# Patient Record
Sex: Female | Born: 1990 | Race: White | Hispanic: No | Marital: Single | Smoking: Former smoker
Health system: Southern US, Community
[De-identification: ages and names within clinical notes are randomized; demographics above are authoritative.]

## PROBLEM LIST (undated history)

## (undated) DIAGNOSIS — Z8711 Personal history of peptic ulcer disease: Secondary | ICD-10-CM

## (undated) DIAGNOSIS — M549 Dorsalgia, unspecified: Secondary | ICD-10-CM

## (undated) DIAGNOSIS — O139 Gestational [pregnancy-induced] hypertension without significant proteinuria, unspecified trimester: Secondary | ICD-10-CM

## (undated) DIAGNOSIS — R87619 Unspecified abnormal cytological findings in specimens from cervix uteri: Secondary | ICD-10-CM

## (undated) DIAGNOSIS — O1490 Unspecified pre-eclampsia, unspecified trimester: Secondary | ICD-10-CM

## (undated) DIAGNOSIS — Z8719 Personal history of other diseases of the digestive system: Secondary | ICD-10-CM

## (undated) DIAGNOSIS — O24419 Gestational diabetes mellitus in pregnancy, unspecified control: Secondary | ICD-10-CM

## (undated) HISTORY — PX: TEAR DUCT PROBING: SHX793

## (undated) HISTORY — DX: Unspecified pre-eclampsia, unspecified trimester: O14.90

## (undated) HISTORY — DX: Unspecified abnormal cytological findings in specimens from cervix uteri: R87.619

## (undated) HISTORY — DX: Gestational diabetes mellitus in pregnancy, unspecified control: O24.419

---
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
## 2008-05-07 ENCOUNTER — Encounter: Admission: RE | Admit: 2008-05-07 | Discharge: 2008-05-07 | Payer: Self-pay | Admitting: Family Medicine

## 2010-04-11 ENCOUNTER — Emergency Department (HOSPITAL_COMMUNITY): Admission: EM | Admit: 2010-04-11 | Discharge: 2010-04-11 | Payer: Self-pay | Admitting: Emergency Medicine

## 2010-09-26 ENCOUNTER — Encounter: Payer: Self-pay | Admitting: Family Medicine

## 2010-10-25 ENCOUNTER — Emergency Department (HOSPITAL_COMMUNITY): Payer: No Typology Code available for payment source

## 2010-10-25 ENCOUNTER — Emergency Department (HOSPITAL_COMMUNITY)
Admission: EM | Admit: 2010-10-25 | Discharge: 2010-10-25 | Disposition: A | Discharge status: Home / Self Care | Payer: No Typology Code available for payment source | Attending: Emergency Medicine | Admitting: Emergency Medicine

## 2010-10-25 DIAGNOSIS — M545 Low back pain, unspecified: Secondary | ICD-10-CM | POA: Insufficient documentation

## 2010-10-25 DIAGNOSIS — Y929 Unspecified place or not applicable: Secondary | ICD-10-CM | POA: Insufficient documentation

## 2010-10-25 DIAGNOSIS — S335XXA Sprain of ligaments of lumbar spine, initial encounter: Secondary | ICD-10-CM | POA: Insufficient documentation

## 2010-11-18 LAB — DIFFERENTIAL
Basophils Absolute: 0.1 10*3/uL (ref 0.0–0.1)
Basophils Relative: 1 % (ref 0–1)
Eosinophils Absolute: 0.2 10*3/uL (ref 0.0–0.7)
Eosinophils Relative: 2 % (ref 0–5)
Lymphocytes Relative: 33 % (ref 12–46)
Lymphs Abs: 3.8 10*3/uL (ref 0.7–4.0)
Monocytes Absolute: 0.6 K/uL (ref 0.1–1.0)
Monocytes Relative: 5 % (ref 3–12)
Neutro Abs: 6.6 K/uL (ref 1.7–7.7)
Neutrophils Relative %: 58 % (ref 43–77)

## 2010-11-18 LAB — URINE CULTURE
Colony Count: NO GROWTH
Culture  Setup Time: 201108090156
Culture: NO GROWTH

## 2010-11-18 LAB — URINALYSIS, ROUTINE W REFLEX MICROSCOPIC
Bilirubin Urine: NEGATIVE
Ketones, ur: NEGATIVE mg/dL
Nitrite: NEGATIVE
Protein, ur: NEGATIVE mg/dL
Urobilinogen, UA: 0.2 mg/dL (ref 0.0–1.0)

## 2010-11-18 LAB — BASIC METABOLIC PANEL
BUN: 6 mg/dL (ref 6–23)
CO2: 21 mEq/L (ref 19–32)
Calcium: 9.3 mg/dL (ref 8.4–10.5)
Chloride: 110 mEq/L (ref 96–112)
Creatinine, Ser: 0.7 mg/dL (ref 0.4–1.2)
GFR calc Af Amer: >60 mL/min (ref >60)

## 2010-11-18 LAB — CBC
HCT: 40.9 % (ref 36.0–46.0)
Hemoglobin: 14.1 g/dL (ref 12.0–15.0)
MCH: 31.6 pg (ref 26.0–34.0)
MCHC: 34.6 g/dL (ref 30.0–36.0)
MCV: 91.3 fL (ref 78.0–100.0)
Platelets: 228 10*3/uL (ref 150–400)
RBC: 4.48 MIL/uL (ref 3.87–5.11)
RDW: 12.8 % (ref 11.5–15.5)
WBC: 11.3 10*3/uL — ABNORMAL HIGH (ref 4.0–10.5)

## 2010-11-18 LAB — BASIC METABOLIC PANEL WITH GFR
GFR calc non Af Amer: >60 mL/min (ref >60)
Glucose, Bld: 79 mg/dL (ref 70–99)
Potassium: 3.6 meq/L (ref 3.5–5.1)
Sodium: 138 meq/L (ref 135–145)

## 2010-11-18 LAB — PREGNANCY, URINE: Preg Test, Ur: NEGATIVE

## 2010-11-28 ENCOUNTER — Ambulatory Visit: Payer: No Typology Code available for payment source | Admitting: Physical Therapy

## 2010-12-06 ENCOUNTER — Ambulatory Visit: Payer: No Typology Code available for payment source | Admitting: Physical Therapy

## 2011-05-09 ENCOUNTER — Other Ambulatory Visit: Payer: Self-pay | Admitting: Family Medicine

## 2011-05-09 DIAGNOSIS — M25561 Pain in right knee: Secondary | ICD-10-CM

## 2011-05-11 ENCOUNTER — Ambulatory Visit
Admission: RE | Admit: 2011-05-11 | Discharge: 2011-05-11 | Disposition: A | Discharge status: Home / Self Care | Payer: PRIVATE HEALTH INSURANCE | Source: Ambulatory Visit | Attending: Family Medicine | Admitting: Family Medicine

## 2011-05-11 DIAGNOSIS — M25561 Pain in right knee: Secondary | ICD-10-CM

## 2012-03-25 ENCOUNTER — Emergency Department (HOSPITAL_COMMUNITY)
Admission: EM | Admit: 2012-03-25 | Discharge: 2012-03-25 | Disposition: A | Discharge status: Home / Self Care | Payer: Self-pay | Attending: Emergency Medicine | Admitting: Emergency Medicine

## 2012-03-25 ENCOUNTER — Encounter (HOSPITAL_COMMUNITY): Payer: Self-pay | Admitting: Emergency Medicine

## 2012-03-25 DIAGNOSIS — M545 Low back pain, unspecified: Secondary | ICD-10-CM | POA: Insufficient documentation

## 2012-03-25 DIAGNOSIS — S39012A Strain of muscle, fascia and tendon of lower back, initial encounter: Secondary | ICD-10-CM

## 2012-03-25 DIAGNOSIS — F17200 Nicotine dependence, unspecified, uncomplicated: Secondary | ICD-10-CM | POA: Insufficient documentation

## 2012-03-25 HISTORY — DX: Dorsalgia, unspecified: M54.9

## 2012-03-25 MED ORDER — PREDNISONE 20 MG PO TABS
60.0000 mg | ORAL_TABLET | Freq: Once | ORAL | Status: AC
  Administered 2012-03-25: 60 mg via ORAL
  Filled 2012-03-25: qty 3

## 2012-03-25 MED ORDER — CYCLOBENZAPRINE HCL 10 MG PO TABS: ORAL_TABLET | ORAL | Status: DC

## 2012-03-25 MED ORDER — CYCLOBENZAPRINE HCL 10 MG PO TABS
10.0000 mg | ORAL_TABLET | Freq: Once | ORAL | Status: AC
  Administered 2012-03-25: 10 mg via ORAL
  Filled 2012-03-25: qty 1

## 2012-03-25 MED ORDER — PREDNISONE 50 MG PO TABS: ORAL_TABLET | ORAL | Status: AC

## 2012-03-25 NOTE — ED Notes (Signed)
Pt c/o lower back pain since Saturday. Pt denies any injury.

## 2012-03-25 NOTE — ED Provider Notes (Signed)
History     CSN: [id number]  Arrival date & time 03/25/12  1017   First MD Initiated Contact with Patient 03/25/12 1051      Chief Complaint  Patient presents with  . Back Pain    (Consider location/radiation/quality/duration/timing/severity/associated sxs/prior treatment) HPI Comments: Lower back hurting for 2 days.  No known injury,  Injured back ~ 2 yrs ago but denies chronic problem.  No radiation.  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  Patient is a 21 y.o. female presenting with back pain. The history is provided by the patient. No language interpreter was used.  Back Pain  This is a new problem. The problem occurs constantly. The pain is associated with no known injury. The pain is present in the lumbar spine. The pain is moderate. The symptoms are aggravated by bending and twisting. The pain is the same all the time. Pertinent negatives include no fever, no numbness, no weight loss, no bowel incontinence, no perianal numbness, no bladder incontinence, no dysuria, no leg pain, no paresthesias, no paresis, no tingling and no weakness. She has tried ice and NSAIDs for the symptoms.    Past Medical History  Diagnosis Date  . Back pain     History reviewed. No pertinent past surgical history.  History reviewed. No pertinent family history.  History  Substance Use Topics  . Smoking status: Current Everyday Smoker    Types: Cigarettes  . Smokeless tobacco: Not on file  . Alcohol Use: Yes    OB History    Grav Para Term Preterm Abortions TAB SAB Ect Mult Living                  Review of Systems  Constitutional: Negative for fever and weight loss.  Gastrointestinal: Negative for bowel incontinence.  Genitourinary: Negative for bladder incontinence, dysuria, urgency, frequency and flank pain.  Musculoskeletal: Positive for back pain.  Neurological: Negative for tingling, weakness, numbness and paresthesias.  All other systems reviewed and are  negative.    Allergies  Review of patient's allergies indicates no known allergies.  Home Medications   Current Outpatient Rx  Name Route Sig Dispense Refill  . IBUPROFEN 200 MG PO TABS Oral Take 800 mg by mouth every 6 (six) hours as needed. For pain    . ADULT MULTIVITAMIN W/MINERALS CH Oral Take 1 tablet by mouth daily.    Marland Kitchen PRESCRIPTION MEDICATION Oral Take 1 tablet by mouth daily. Birth Control. Name unknown      Ht 5' (1.524 m)  Wt 200 lb (90.719 kg)  BMI 39.06 kg/m2  LMP 02/03/2012  Physical Exam  Nursing note and vitals reviewed. Constitutional: She is oriented to person, place, and time. She appears well-developed and well-nourished. No distress.  HENT:  Head: Normocephalic and atraumatic.  Eyes: EOM are normal.  Neck: Normal range of motion.  Cardiovascular: Normal rate, regular rhythm and normal heart sounds.   Pulmonary/Chest: Effort normal and breath sounds normal.  Abdominal: Soft. She exhibits no distension. There is no tenderness.  Musculoskeletal: She exhibits tenderness.       Lumbar back: She exhibits tenderness and pain. She exhibits no bony tenderness, no swelling, no edema, no deformity, no laceration, no spasm and normal pulse.       Back:  Neurological: She is alert and oriented to person, place, and time. Coordination normal.  Skin: Skin is warm and dry.  Psychiatric: She has a normal mood and affect. Judgment normal.    ED Course  Procedures (including critical care time)  Labs Reviewed - No data to display No results found.   No diagnosis found.    MDM  No saddle dysthesia or incontinence.   Ice rx-prednisone 50 mg, 6 rx-flexeril 10 mg, 20 F/u with dr. Shelle Iron prn        Evalina Field, PA 03/25/12 [phone]

## 2012-03-25 NOTE — ED Notes (Signed)
Rick PA in prior to RN, see PA assessment for further,

## 2012-03-25 NOTE — ED Provider Notes (Signed)
Medical screening examination/treatment/procedure(s) were performed by non-physician practitioner and as supervising physician I was immediately available for consultation/collaboration.  Donnetta Hutching, MD 03/25/12 1220

## 2012-08-07 ENCOUNTER — Emergency Department (HOSPITAL_COMMUNITY)
Admission: EM | Admit: 2012-08-07 | Discharge: 2012-08-07 | Disposition: A | Discharge status: Home / Self Care | Payer: Self-pay | Attending: Emergency Medicine | Admitting: Emergency Medicine

## 2012-08-07 ENCOUNTER — Encounter (HOSPITAL_COMMUNITY): Payer: Self-pay | Admitting: *Deleted

## 2012-08-07 ENCOUNTER — Emergency Department (HOSPITAL_COMMUNITY): Payer: Self-pay

## 2012-08-07 DIAGNOSIS — R0789 Other chest pain: Secondary | ICD-10-CM | POA: Insufficient documentation

## 2012-08-07 DIAGNOSIS — R6883 Chills (without fever): Secondary | ICD-10-CM | POA: Insufficient documentation

## 2012-08-07 DIAGNOSIS — F17200 Nicotine dependence, unspecified, uncomplicated: Secondary | ICD-10-CM | POA: Insufficient documentation

## 2012-08-07 DIAGNOSIS — IMO0001 Reserved for inherently not codable concepts without codable children: Secondary | ICD-10-CM | POA: Insufficient documentation

## 2012-08-07 DIAGNOSIS — J40 Bronchitis, not specified as acute or chronic: Secondary | ICD-10-CM | POA: Insufficient documentation

## 2012-08-07 DIAGNOSIS — J3489 Other specified disorders of nose and nasal sinuses: Secondary | ICD-10-CM | POA: Insufficient documentation

## 2012-08-07 DIAGNOSIS — M549 Dorsalgia, unspecified: Secondary | ICD-10-CM | POA: Insufficient documentation

## 2012-08-07 MED ORDER — ALBUTEROL SULFATE HFA 108 (90 BASE) MCG/ACT IN AERS
2.0000 | INHALATION_SPRAY | Freq: Once | RESPIRATORY_TRACT | Status: AC
  Administered 2012-08-07: 2 via RESPIRATORY_TRACT
  Filled 2012-08-07: qty 6.7

## 2012-08-07 MED ORDER — GUAIFENESIN-CODEINE 100-10 MG/5ML PO SYRP: 10.0000 mL | ORAL_SOLUTION | Freq: Three times a day (TID) | ORAL | Status: AC | PRN

## 2012-08-07 MED ORDER — AZITHROMYCIN 250 MG PO TABS: ORAL_TABLET | ORAL | Status: DC

## 2012-08-07 NOTE — ED Provider Notes (Signed)
Medical screening examination/treatment/procedure(s) were performed by non-physician practitioner and as supervising physician I was immediately available for consultation/collaboration. Devoria Albe, MD, Armando Gang   Ward Givens, MD 08/07/12 [phone]

## 2012-08-07 NOTE — ED Provider Notes (Signed)
History     CSN: [id number]  Arrival date & time 08/07/12  1135   First MD Initiated Contact with Patient 08/07/12 1232      Chief Complaint  Patient presents with  . Cough    (Consider location/radiation/quality/duration/timing/severity/associated sxs/prior treatment) Patient is a 21 y.o. female presenting with cough. The history is provided by the patient.  Cough This is a new problem. The current episode started more than 1 week ago. The problem occurs every few minutes. The problem has not changed since onset.The cough is productive of purulent sputum. There has been no fever. Associated symptoms include chills, rhinorrhea and myalgias. Pertinent negatives include no chest pain, no sweats, no ear congestion, no ear pain, no headaches, no sore throat, no shortness of breath and no wheezing. Associated symptoms comments: Chest tightness. She has tried decongestants for the symptoms. The treatment provided no relief. She is a smoker. Her past medical history is significant for pneumonia. Her past medical history does not include asthma.    Past Medical History  Diagnosis Date  . Back pain     History reviewed. No pertinent past surgical history.  No family history on file.  History  Substance Use Topics  . Smoking status: Current Every Day Smoker    Types: Cigarettes  . Smokeless tobacco: Not on file  . Alcohol Use: Yes    OB History    Grav Para Term Preterm Abortions TAB SAB Ect Mult Living                  Review of Systems  Constitutional: Positive for chills. Negative for fever, activity change and appetite change.  HENT: Positive for congestion and rhinorrhea. Negative for ear pain, sore throat, facial swelling, trouble swallowing, neck pain and neck stiffness.   Eyes: Negative for visual disturbance.  Respiratory: Positive for cough and chest tightness. Negative for shortness of breath, wheezing and stridor.   Cardiovascular: Negative for chest pain.   Gastrointestinal: Negative for nausea and vomiting.  Musculoskeletal: Positive for myalgias.  Skin: Negative.   Neurological: Negative for dizziness, weakness, numbness and headaches.  Hematological: Negative for adenopathy.  Psychiatric/Behavioral: Negative for confusion.  All other systems reviewed and are negative.    Allergies  Review of patient's allergies indicates no known allergies.  Home Medications   Current Outpatient Rx  Name  Route  Sig  Dispense  Refill  . CYCLOBENZAPRINE HCL 10 MG PO TABS      One tab po TID   20 tablet   0   . IBUPROFEN 200 MG PO TABS   Oral   Take 800 mg by mouth every 6 (six) hours as needed. For pain         . ADULT MULTIVITAMIN W/MINERALS CH   Oral   Take 1 tablet by mouth daily.         Marland Kitchen PRESCRIPTION MEDICATION   Oral   Take 1 tablet by mouth daily. Birth Control. Name unknown           BP 134/70  Pulse 99  Temp 98.6 F (37 C) (Oral)  Resp 20  Ht 5\' 1"  (1.549 m)  Wt 200 lb 8 oz (90.946 kg)  BMI 37.88 kg/m2  SpO2 97%  LMP 08/06/2012  Physical Exam  Nursing note and vitals reviewed. Constitutional: She is oriented to person, place, and time. She appears well-developed and well-nourished. No distress.  HENT:  Head: Normocephalic and atraumatic.  Right Ear: Tympanic membrane and ear canal  normal.  Left Ear: Tympanic membrane and ear canal normal.  Nose: Mucosal edema and rhinorrhea present.  Mouth/Throat: Uvula is midline, oropharynx is clear and moist and mucous membranes are normal. No oropharyngeal exudate.  Eyes: Conjunctivae normal and EOM are normal. Pupils are equal, round, and reactive to light.  Neck: Normal range of motion. Neck supple.  Cardiovascular: Normal rate, regular rhythm, normal heart sounds and intact distal pulses.   No murmur heard. Pulmonary/Chest: Effort normal. No respiratory distress. She has no rales. She exhibits no tenderness.       Coarse lungs sounds bilaterally.  No wheezing or  rales.  Musculoskeletal: She exhibits no edema.  Lymphadenopathy:    She has no cervical adenopathy.  Neurological: She is alert and oriented to person, place, and time. She exhibits normal muscle tone. Coordination normal.  Skin: Skin is warm and dry.    ED Course  Procedures (including critical care time)  Labs Reviewed - No data to display Dg Chest 2 View  08/07/2012  *RADIOLOGY REPORT*  Clinical Data: Cough and shortness of breath.  CHEST - 2 VIEW  Comparison: 03/30/2011.  Findings: The cardiac silhouette, mediastinal and hilar contours are within normal limits and stable. The lungs are clear. Mild chronic bronchitic changes, likely related to smoking.  No pleural effusions.  The bony thorax is intact.  IMPRESSION: Mild chronic bronchitic changes, likely related to smoking.  No acute overlying pulmonary process.   Original Report Authenticated By: Rudie Meyer, M.D.         MDM    Vital signs are stable patient is well-appearing. No hypoxia. Lung sounds are slightly diminished without rales or wheezing. Symptoms are likely related to bronchitis, I am doubtful of PE.   Albuterol inhaler was dispensed in ED.  Patient agrees to close followup with her primary care physician, I will prescribe Zithromax and an guaifenesin with codeine cough syrup.   Matisha Termine L. Marleta Lapierre, [place] 08/07/12 1322

## 2012-08-07 NOTE — ED Notes (Signed)
Reports productive cough with green sputum x 1 wk.  Taking OTC mucinex with no relief.

## 2012-12-12 ENCOUNTER — Emergency Department (HOSPITAL_COMMUNITY)
Admission: EM | Admit: 2012-12-12 | Discharge: 2012-12-12 | Disposition: A | Discharge status: Home / Self Care | Payer: Self-pay | Attending: Emergency Medicine | Admitting: Emergency Medicine

## 2012-12-12 ENCOUNTER — Encounter (HOSPITAL_COMMUNITY): Payer: Self-pay

## 2012-12-12 DIAGNOSIS — R5381 Other malaise: Secondary | ICD-10-CM | POA: Insufficient documentation

## 2012-12-12 DIAGNOSIS — J029 Acute pharyngitis, unspecified: Secondary | ICD-10-CM | POA: Insufficient documentation

## 2012-12-12 DIAGNOSIS — F17200 Nicotine dependence, unspecified, uncomplicated: Secondary | ICD-10-CM | POA: Insufficient documentation

## 2012-12-12 DIAGNOSIS — H9209 Otalgia, unspecified ear: Secondary | ICD-10-CM | POA: Insufficient documentation

## 2012-12-12 DIAGNOSIS — R509 Fever, unspecified: Secondary | ICD-10-CM | POA: Insufficient documentation

## 2012-12-12 MED ORDER — IBUPROFEN 800 MG PO TABS
800.0000 mg | ORAL_TABLET | Freq: Once | ORAL | Status: AC
  Administered 2012-12-12: 800 mg via ORAL
  Filled 2012-12-12: qty 1

## 2012-12-12 MED ORDER — AMOXICILLIN 250 MG PO CAPS
500.0000 mg | ORAL_CAPSULE | Freq: Once | ORAL | Status: AC
  Administered 2012-12-12: 500 mg via ORAL
  Filled 2012-12-12: qty 2

## 2012-12-12 MED ORDER — AMOXICILLIN 500 MG PO CAPS: 500.0000 mg | ORAL_CAPSULE | Freq: Three times a day (TID) | ORAL | Status: DC

## 2012-12-12 MED ORDER — IBUPROFEN 600 MG PO TABS: 600.0000 mg | ORAL_TABLET | Freq: Four times a day (QID) | ORAL | Status: DC | PRN

## 2012-12-12 NOTE — ED Notes (Signed)
Pt c/o sore throat and congestion since last night. Pt states she has "been around someone with strep".

## 2012-12-12 NOTE — ED Notes (Signed)
Sore throat and ear pain per pt. Woke up with my throat hurting around 2 pm. Ears just started hurting per pt.

## 2012-12-15 NOTE — ED Provider Notes (Signed)
History     CSN: [id number]  Arrival date & time 12/12/12  2050   First MD Initiated Contact with Patient 12/12/12 2101      Chief Complaint  Patient presents with  . Sore Throat  . Otalgia    (Consider location/radiation/quality/duration/timing/severity/associated sxs/prior treatment) HPI Comments: Allison Garcia is a 22 y.o. Female who developed sore throat, ear pain and chills with subjective fevers today. She has taken tylenol with reduction in fever.  She has had close contact with a family member who is currently being treated for a documented strep throat.  She denies nasal congestion or drainage,  Cough, shortness of breath, nausea or vomiting, headache, but has felt fatigued.  Her ears hurt when she swallows.     The history is provided by the patient.    Past Medical History  Diagnosis Date  . Back pain     History reviewed. No pertinent past surgical history.  No family history on file.  History  Substance Use Topics  . Smoking status: Current Every Day Smoker    Types: Cigarettes  . Smokeless tobacco: Not on file  . Alcohol Use: Yes    OB History   Grav Para Term Preterm Abortions TAB SAB Ect Mult Living                  Review of Systems  Constitutional: Positive for fever, chills and fatigue.  HENT: Positive for ear pain and sore throat. Negative for congestion, rhinorrhea, neck pain and voice change.   Eyes: Negative.   Respiratory: Negative for cough, chest tightness and shortness of breath.   Cardiovascular: Negative for chest pain.  Gastrointestinal: Negative for nausea and abdominal pain.  Genitourinary: Negative.   Musculoskeletal: Negative for joint swelling and arthralgias.  Skin: Negative.  Negative for rash and wound.  Neurological: Negative for dizziness, weakness, light-headedness, numbness and headaches.  Psychiatric/Behavioral: Negative.     Allergies  Review of patient's allergies indicates no known allergies.  Home  Medications   Current Outpatient Rx  Name  Route  Sig  Dispense  Refill  . acetaminophen (TYLENOL) 500 MG tablet   Oral   Take 1,000 mg by mouth daily as needed for pain.         Marland Kitchen PRESCRIPTION MEDICATION   Oral   Take 1 tablet by mouth every evening. Birth Control. Name unknown         . amoxicillin (AMOXIL) 500 MG capsule   Oral   Take 1 capsule (500 mg total) by mouth 3 (three) times daily.   30 capsule   0   . ibuprofen (ADVIL,MOTRIN) 600 MG tablet   Oral   Take 1 tablet (600 mg total) by mouth every 6 (six) hours as needed for pain.   20 tablet   0     BP 139/95  Pulse 97  Temp(Src) 98.2 F (36.8 C) (Oral)  Resp 16  Ht 5\' 1"  (1.549 m)  Wt 205 lb (92.987 kg)  BMI 38.75 kg/m2  LMP 11/17/2012  Physical Exam  Constitutional: She is oriented to person, place, and time. She appears well-developed and well-nourished.  HENT:  Head: Normocephalic and atraumatic.  Right Ear: Tympanic membrane and ear canal normal.  Left Ear: Tympanic membrane and ear canal normal.  Nose: No mucosal edema or rhinorrhea.  Mouth/Throat: Uvula is midline and mucous membranes are normal. Posterior oropharyngeal edema and posterior oropharyngeal erythema present. No oropharyngeal exudate or tonsillar abscesses.  2+ tonsillar nodes.  Eyes:  Conjunctivae are normal.  Cardiovascular: Normal rate and normal heart sounds.   Pulmonary/Chest: Effort normal. No respiratory distress. She has no wheezes. She has no rales.  Abdominal: Soft. There is no tenderness.  Musculoskeletal: Normal range of motion.  Lymphadenopathy:       Head (right side): Tonsillar adenopathy present. No posterior auricular and no occipital adenopathy present.       Head (left side): Tonsillar adenopathy present. No posterior auricular and no occipital adenopathy present.  Neurological: She is alert and oriented to person, place, and time.  Skin: Skin is warm and dry. No rash noted.  Psychiatric: She has a normal mood and  affect.    ED Course  Procedures (including critical care time)  Labs Reviewed - No data to display No results found.   1. Pharyngitis, acute       MDM  With positive exposure to strep pharyngitis,  Pt treated accordingly with amoxil,  Ibuprofen prescribed for throat pain and fever.  Encouraged rest,  Increased fluid intake,  Recheck for sx not improving or any worsened sx over the next several days.        Burgess Amor, PA-C 12/15/12 [phone]

## 2012-12-16 NOTE — ED Provider Notes (Signed)
Medical screening examination/treatment/procedure(s) were performed by non-physician practitioner and as supervising physician I was immediately available for consultation/collaboration.   Bridget Westbrooks L Keilly Fatula, MD 12/16/12 1121 

## 2013-05-27 ENCOUNTER — Emergency Department (HOSPITAL_COMMUNITY)
Admission: EM | Admit: 2013-05-27 | Discharge: 2013-05-27 | Disposition: A | Discharge status: Home / Self Care | Payer: 59 | Attending: Emergency Medicine | Admitting: Emergency Medicine

## 2013-05-27 ENCOUNTER — Encounter (HOSPITAL_COMMUNITY): Payer: Self-pay | Admitting: Emergency Medicine

## 2013-05-27 DIAGNOSIS — F17200 Nicotine dependence, unspecified, uncomplicated: Secondary | ICD-10-CM | POA: Insufficient documentation

## 2013-05-27 DIAGNOSIS — J40 Bronchitis, not specified as acute or chronic: Secondary | ICD-10-CM

## 2013-05-27 DIAGNOSIS — J209 Acute bronchitis, unspecified: Secondary | ICD-10-CM | POA: Insufficient documentation

## 2013-05-27 DIAGNOSIS — J069 Acute upper respiratory infection, unspecified: Secondary | ICD-10-CM

## 2013-05-27 MED ORDER — GUAIFENESIN-CODEINE 100-10 MG/5ML PO SYRP: 10.0000 mL | ORAL_SOLUTION | Freq: Three times a day (TID) | ORAL | Status: DC | PRN

## 2013-05-27 MED ORDER — AZITHROMYCIN 250 MG PO TABS: ORAL_TABLET | ORAL | Status: DC

## 2013-05-27 NOTE — ED Notes (Signed)
Pt c/o itchy eyes starting last wee. Cough started 3 days ago. Pt states she has thick yellowish green colored sputum. Pt also c/o sore throat, headache, low grade fever and ear "fullness."

## 2013-05-29 NOTE — ED Provider Notes (Signed)
Medical screening examination/treatment/procedure(s) were performed by non-physician practitioner and as supervising physician I was immediately available for consultation/collaboration.  Natayah Warmack, MD 05/29/13 0632 

## 2013-05-29 NOTE — ED Provider Notes (Signed)
CSN: [id number]     Arrival date & time 05/27/13  1527 History   First MD Initiated Contact with Patient 05/27/13 1604     Chief Complaint  Patient presents with  . Cough   (Consider location/radiation/quality/duration/timing/severity/associated sxs/prior Treatment) Patient is a 22 y.o. female presenting with cough. The history is provided by the patient.  Cough Cough characteristics:  Productive Sputum characteristics:  Yellow and green Severity:  Moderate Onset quality:  Gradual Duration:  3 days Timing:  Intermittent Progression:  Worsening Chronicity:  New Smoker: yes   Context: upper respiratory infection   Relieved by:  Nothing Worsened by:  Nothing tried Ineffective treatments:  Cough suppressants and decongestant Associated symptoms: ear pain, fever, headaches, rhinorrhea and sore throat   Associated symptoms: no chills, no rash, no shortness of breath, no sinus congestion and no wheezing   Headaches:    Severity:  Mild   Onset quality:  Gradual   Timing:  Intermittent   Progression:  Waxing and waning   Chronicity:  New Rhinorrhea:    Quality:  Clear   Severity:  Mild   Timing:  Intermittent   Progression:  Unchanged Sore throat:    Severity:  Mild   Onset quality:  Gradual   Timing:  Constant   Progression:  Unchanged   Past Medical History  Diagnosis Date  . Back pain    History reviewed. No pertinent past surgical history. Family History  Problem Relation Age of Onset  . COPD Other    History  Substance Use Topics  . Smoking status: Current Every Day Smoker -- 1.00 packs/day for 9 years    Types: Cigarettes  . Smokeless tobacco: Not on file  . Alcohol Use: Yes     Comment: occas   OB History   Grav Para Term Preterm Abortions TAB SAB Ect Mult Living                 Review of Systems  Constitutional: Positive for fever. Negative for chills, activity change and appetite change.  HENT: Positive for ear pain, congestion, sore throat and  rhinorrhea. Negative for facial swelling, trouble swallowing, neck pain and neck stiffness.   Eyes: Negative for visual disturbance.  Respiratory: Positive for cough. Negative for chest tightness, shortness of breath, wheezing and stridor.   Gastrointestinal: Negative for nausea, vomiting and abdominal pain.  Genitourinary: Negative for dysuria.  Skin: Negative.  Negative for rash.  Neurological: Positive for headaches. Negative for dizziness, weakness, light-headedness and numbness.  Hematological: Negative for adenopathy.  Psychiatric/Behavioral: Negative for confusion.  All other systems reviewed and are negative.    Allergies  Review of patient's allergies indicates no known allergies.  Home Medications   Current Outpatient Rx  Name  Route  Sig  Dispense  Refill  . dextromethorphan-guaiFENesin (MUCINEX DM) 30-600 MG per 12 hr tablet   Oral   Take 1 tablet by mouth daily as needed (for congestion and cold symptoms).         Marland Kitchen ibuprofen (ADVIL,MOTRIN) 200 MG tablet   Oral   Take 800 mg by mouth every 6 (six) hours as needed for pain.         Marland Kitchen levonorgestrel-ethinyl estradiol (SEASONALE,INTROVALE,JOLESSA) 0.15-0.03 MG tablet   Oral   Take 1 tablet by mouth at bedtime.         Marland Kitchen loratadine (ALLERGY RELIEF) 10 MG tablet   Oral   Take 10 mg by mouth daily as needed for allergies.         Marland Kitchen  phenylephrine (SUDAFED PE) 10 MG TABS tablet   Oral   Take 10 mg by mouth every 4 (four) hours as needed.         Marland Kitchen azithromycin (ZITHROMAX Z-PAK) 250 MG tablet      Take two tablets on day one, then one tab qd days 2-5   6 tablet   0   . guaiFENesin-codeine (ROBITUSSIN AC) 100-10 MG/5ML syrup   Oral   Take 10 mLs by mouth 3 (three) times daily as needed for cough.   120 mL   0    BP 140/80  Pulse 93  Temp(Src) 98.3 F (36.8 C) (Oral)  Resp 16  Ht 5\' 1"  (1.549 m)  Wt 200 lb (90.719 kg)  BMI 37.81 kg/m2  SpO2 100%  LMP 05/13/2013 Physical Exam  Nursing note  and vitals reviewed. Constitutional: She is oriented to person, place, and time. She appears well-developed and well-nourished. No distress.  HENT:  Head: Normocephalic and atraumatic.  Right Ear: Tympanic membrane and ear canal normal. Tympanic membrane is not erythematous.  Left Ear: Tympanic membrane and ear canal normal. Tympanic membrane is not erythematous.  Nose: Mucosal edema and rhinorrhea present.  Mouth/Throat: Uvula is midline and mucous membranes are normal. No trismus in the jaw. No edematous. Posterior oropharyngeal erythema present. No oropharyngeal exudate, posterior oropharyngeal edema or tonsillar abscesses.  Eyes: Conjunctivae are normal.  Neck: Normal range of motion and phonation normal. Neck supple. No Brudzinski's sign and no Kernig's sign noted.  Cardiovascular: Normal rate, regular rhythm, normal heart sounds and intact distal pulses.   No murmur heard. Pulmonary/Chest: Effort normal and breath sounds normal. No respiratory distress. She has no wheezes. She has no rales.  Coarse lung sounds bilaterally w/o rales or wheezing  Abdominal: Soft. She exhibits no distension. There is no tenderness. There is no rebound and no guarding.  Musculoskeletal: Normal range of motion. She exhibits no edema.  Lymphadenopathy:    She has no cervical adenopathy.  Neurological: She is alert and oriented to person, place, and time. She exhibits normal muscle tone. Coordination normal.  Skin: Skin is warm and dry.    ED Course  Procedures (including critical care time) Labs Review Labs Reviewed - No data to display Imaging Review No results found.  MDM   1. Bronchitis   2. Upper respiratory infection    VSS.  Patient is well appearing.  No meningeal signs.  No PTA.  Mucous membranes are moist.  Will treat with z pack, robitussin AC and she agrees to rest, fluids and continue sudafed.  Appears stable for discharge, agrees to f/u with her PMD if needed    Dawt Reeb L. Jeremie Giangrande,  PA-C 05/29/13 0104

## 2013-06-14 ENCOUNTER — Emergency Department (HOSPITAL_COMMUNITY)
Admission: EM | Admit: 2013-06-14 | Discharge: 2013-06-14 | Disposition: A | Discharge status: Home / Self Care | Payer: 59 | Attending: Emergency Medicine | Admitting: Emergency Medicine

## 2013-06-14 ENCOUNTER — Encounter (HOSPITAL_COMMUNITY): Payer: Self-pay | Admitting: Emergency Medicine

## 2013-06-14 DIAGNOSIS — Y9389 Activity, other specified: Secondary | ICD-10-CM | POA: Insufficient documentation

## 2013-06-14 DIAGNOSIS — W108XXA Fall (on) (from) other stairs and steps, initial encounter: Secondary | ICD-10-CM | POA: Insufficient documentation

## 2013-06-14 DIAGNOSIS — F17200 Nicotine dependence, unspecified, uncomplicated: Secondary | ICD-10-CM | POA: Insufficient documentation

## 2013-06-14 DIAGNOSIS — IMO0002 Reserved for concepts with insufficient information to code with codable children: Secondary | ICD-10-CM | POA: Insufficient documentation

## 2013-06-14 DIAGNOSIS — M549 Dorsalgia, unspecified: Secondary | ICD-10-CM

## 2013-06-14 DIAGNOSIS — Y9289 Other specified places as the place of occurrence of the external cause: Secondary | ICD-10-CM | POA: Insufficient documentation

## 2013-06-14 MED ORDER — NAPROXEN 500 MG PO TABS: 500.0000 mg | ORAL_TABLET | Freq: Two times a day (BID) | ORAL | Status: DC

## 2013-06-14 MED ORDER — NAPROXEN 250 MG PO TABS
500.0000 mg | ORAL_TABLET | Freq: Once | ORAL | Status: DC
  Filled 2013-06-14: qty 2

## 2013-06-14 MED ORDER — CYCLOBENZAPRINE HCL 10 MG PO TABS
5.0000 mg | ORAL_TABLET | Freq: Once | ORAL | Status: AC
  Administered 2013-06-14: 5 mg via ORAL
  Filled 2013-06-14: qty 1

## 2013-06-14 MED ORDER — HYDROCODONE-ACETAMINOPHEN 5-325 MG PO TABS: ORAL_TABLET | ORAL | Status: DC

## 2013-06-14 MED ORDER — METHOCARBAMOL 500 MG PO TABS: 1000.0000 mg | ORAL_TABLET | Freq: Four times a day (QID) | ORAL | Status: DC

## 2013-06-14 MED ORDER — HYDROCODONE-ACETAMINOPHEN 5-325 MG PO TABS
2.0000 | ORAL_TABLET | Freq: Once | ORAL | Status: AC
  Administered 2013-06-14: 2 via ORAL
  Filled 2013-06-14: qty 2

## 2013-06-14 NOTE — ED Notes (Signed)
Reports falling last night and now having lower back pain that's radiating down right leg, hx of back pain.

## 2013-06-14 NOTE — ED Provider Notes (Signed)
CSN: [id number]     Arrival date & time 06/14/13  1347 History  This chart was scribed for non-physician practitioner, Rhea Bleacher, PA-C working with Flint Melter, MD by Greggory Stallion, ED Scribe. This patient was seen in room TR09C/TR09C and the patient's care was started at 4:01 PM.   Chief Complaint  Patient presents with  . Fall  . Back Pain    HPI HPI Comments: Allison Garcia is a 22 y.o. female with h/o back pain who presents to the Emergency Department complaining of sudden onset, constant sharp lower back pain caused by a fall last night. She states she slipped on the steps and landed on her buttocks. She states the pain is radiating down her right leg intermittently. Pt has taken ibuprofen and tylenol with no relief. Pt denies numbness, tingling, urinary symptoms, bladder or bowel incontinence, fever and abnormal weight loss. She has a history of back pain and was recommended to have surgery but did not want to have it. Pt denies history of cancer or IV drug use.   Past Medical History  Diagnosis Date  . Back pain    History reviewed. No pertinent past surgical history. Family History  Problem Relation Age of Onset  . COPD Other    History  Substance Use Topics  . Smoking status: Current Every Day Smoker -- 1.00 packs/day for 9 years    Types: Cigarettes  . Smokeless tobacco: Not on file  . Alcohol Use: Yes     Comment: occas   OB History   Grav Para Term Preterm Abortions TAB SAB Ect Mult Living                 Review of Systems  Constitutional: Negative for fever and unexpected weight change.  Gastrointestinal: Negative for constipation.       Negative for fecal incontinence.   Genitourinary: Negative for dysuria, hematuria, flank pain, vaginal bleeding, vaginal discharge and pelvic pain.       Negative for bowel or bladder incontinence.   Musculoskeletal: Positive for back pain and myalgias.  Neurological: Negative for weakness and numbness.       Denies  saddle paresthesias.    Allergies  Review of patient's allergies indicates no known allergies.  Home Medications   Current Outpatient Rx  Name  Route  Sig  Dispense  Refill  . ibuprofen (ADVIL,MOTRIN) 200 MG tablet   Oral   Take 1,000 mg by mouth every 6 (six) hours as needed for pain.          Marland Kitchen levonorgestrel-ethinyl estradiol (SEASONALE,INTROVALE,JOLESSA) 0.15-0.03 MG tablet   Oral   Take 1 tablet by mouth at bedtime.         Marland Kitchen OVER THE COUNTER MEDICATION   Oral   Take 2 tablets by mouth every 8 (eight) hours as needed (for pain). Tylenol Back and Body          Triage Vitals:BP 156/99  Pulse 86  Temp(Src) 98.6 F (37 C) (Oral)  Resp 18  SpO2 98%  LMP 05/13/2013 Physical Exam  Nursing note and vitals reviewed. Constitutional: She appears well-developed and well-nourished.  HENT:  Head: Normocephalic and atraumatic.  Eyes: Conjunctivae are normal.  Neck: Normal range of motion. Neck supple.  Pulmonary/Chest: Effort normal.  Abdominal: Soft. There is no tenderness. There is no CVA tenderness.  Musculoskeletal: Normal range of motion.       Cervical back: She exhibits normal range of motion, no tenderness and no bony tenderness.  Thoracic back: She exhibits normal range of motion, no tenderness and no bony tenderness.       Lumbar back: She exhibits tenderness. She exhibits normal range of motion and no bony tenderness.       Back:  No step-off noted with palpation of spine.   Neurological: She is alert. She has normal strength and normal reflexes. No sensory deficit.  5/5 strength in entire lower extremities bilaterally. No sensation deficit.   Skin: Skin is warm and dry. No rash noted.  Psychiatric: She has a normal mood and affect.    ED Course  Procedures (including critical care time) DIAGNOSTIC STUDIES: Oxygen Saturation is 98% on RA, normal by my interpretation.    COORDINATION OF CARE: 4:08 PM- Discussed treatment plan with pt at bedside  which includes pain medication and a muscle relaxer. Pt verbalized understanding and agreement with plan. Advised pt to do gentle stretches.   Labs Review Labs Reviewed - No data to display Imaging Review No results found.  EKG Interpretation   None      Patient seen and examined. Medications ordered.   Vital signs reviewed and are as follows: Filed Vitals:   06/14/13 1627  BP: 117/77  Pulse: 76  Temp: 98 F (36.7 C)  Resp: 18   No red flag s/s of low back pain. Patient was counseled on back pain precautions and told to do activity as tolerated but do not lift, push, or pull heavy objects more than 10 pounds for the next week.  Patient counseled to use ice or heat on back for no longer than 15 minutes every hour.   Patient prescribed muscle relaxer and counseled on proper use of muscle relaxant medication.    Patient prescribed narcotic pain medicine and counseled on proper use of narcotic pain medications. Counseled not to combine this medication with others containing tylenol.   Urged patient not to drink alcohol, drive, or perform any other activities that requires focus while taking either of these medications.  Patient urged to follow-up with PCP if pain does not improve with treatment and rest or if pain becomes recurrent. Urged to return with worsening severe pain, loss of bowel or bladder control, trouble walking.   The patient verbalizes understanding and agrees with the plan.    MDM   1. Back pain    Patient with back pain, some radicular features. No neurological deficits. Patient is ambulatory. No warning symptoms of back pain including: loss of bowel or bladder control, night sweats, waking from sleep with back pain, unexplained fevers or weight loss, h/o cancer, IVDU, recent trauma. No concern for cauda equina, epidural abscess, or other serious cause of back pain. Conservative measures such as rest, ice/heat and pain medicine indicated with PCP follow-up if no  improvement with conservative management.   I personally performed the services described in this documentation, which was scribed in my presence. The recorded information has been reviewed and is accurate.     Renne Crigler, PA-C 06/14/13 1610

## 2014-01-07 ENCOUNTER — Emergency Department (HOSPITAL_COMMUNITY)
Admission: EM | Admit: 2014-01-07 | Discharge: 2014-01-07 | Disposition: A | Discharge status: Home / Self Care | Payer: 59 | Attending: Emergency Medicine | Admitting: Emergency Medicine

## 2014-01-07 ENCOUNTER — Encounter (HOSPITAL_COMMUNITY): Payer: Self-pay | Admitting: Emergency Medicine

## 2014-01-07 DIAGNOSIS — F17200 Nicotine dependence, unspecified, uncomplicated: Secondary | ICD-10-CM | POA: Insufficient documentation

## 2014-01-07 DIAGNOSIS — Z3202 Encounter for pregnancy test, result negative: Secondary | ICD-10-CM | POA: Insufficient documentation

## 2014-01-07 DIAGNOSIS — Z791 Long term (current) use of non-steroidal anti-inflammatories (NSAID): Secondary | ICD-10-CM | POA: Insufficient documentation

## 2014-01-07 DIAGNOSIS — K2970 Gastritis, unspecified, without bleeding: Secondary | ICD-10-CM

## 2014-01-07 DIAGNOSIS — K2990 Gastroduodenitis, unspecified, without bleeding: Principal | ICD-10-CM

## 2014-01-07 HISTORY — DX: Personal history of peptic ulcer disease: Z87.11

## 2014-01-07 HISTORY — DX: Personal history of other diseases of the digestive system: Z87.19

## 2014-01-07 LAB — URINE MICROSCOPIC-ADD ON

## 2014-01-07 LAB — CBC WITH DIFFERENTIAL/PLATELET
Basophils Absolute: 0.1 10*3/uL (ref 0.0–0.1)
Basophils Relative: 1 % (ref 0–1)
EOS ABS: 0.1 10*3/uL (ref 0.0–0.7)
Eosinophils Relative: 1 % (ref 0–5)
HCT: 45.5 % (ref 36.0–46.0)
HEMOGLOBIN: 16.3 g/dL — AB (ref 12.0–15.0)
LYMPHS ABS: 2.3 10*3/uL (ref 0.7–4.0)
Lymphocytes Relative: 17 % (ref 12–46)
MCH: 31.9 pg (ref 26.0–34.0)
MCHC: 35.8 g/dL (ref 30.0–36.0)
MCV: 89 fL (ref 78.0–100.0)
MONOS PCT: 5 % (ref 3–12)
Monocytes Absolute: 0.7 10*3/uL (ref 0.1–1.0)
Neutro Abs: 11 10*3/uL — ABNORMAL HIGH (ref 1.7–7.7)
Neutrophils Relative %: 78 % — ABNORMAL HIGH (ref 43–77)
Platelets: 303 10*3/uL (ref 150–400)
RBC: 5.11 MIL/uL (ref 3.87–5.11)
RDW: 12.7 % (ref 11.5–15.5)
WBC: 14.1 10*3/uL — ABNORMAL HIGH (ref 4.0–10.5)

## 2014-01-07 LAB — URINALYSIS, ROUTINE W REFLEX MICROSCOPIC
BILIRUBIN URINE: NEGATIVE
GLUCOSE, UA: NEGATIVE mg/dL
Ketones, ur: NEGATIVE mg/dL
Leukocytes, UA: NEGATIVE
Nitrite: NEGATIVE
PH: 6 (ref 5.0–8.0)
Protein, ur: NEGATIVE mg/dL
SPECIFIC GRAVITY, URINE: 1.02 (ref 1.005–1.030)
Urobilinogen, UA: 0.2 mg/dL (ref 0.0–1.0)

## 2014-01-07 LAB — LIPASE, BLOOD: Lipase: 20 U/L (ref 11–59)

## 2014-01-07 LAB — COMPREHENSIVE METABOLIC PANEL
ALT: 18 U/L (ref 0–35)
AST: 19 U/L (ref 0–37)
Albumin: 3.6 g/dL (ref 3.5–5.2)
Alkaline Phosphatase: 49 U/L (ref 39–117)
BILIRUBIN TOTAL: 0.2 mg/dL — AB (ref 0.3–1.2)
BUN: 6 mg/dL (ref 6–23)
CALCIUM: 9.8 mg/dL (ref 8.4–10.5)
CO2: 19 meq/L (ref 19–32)
Chloride: 102 mEq/L (ref 96–112)
Creatinine, Ser: 0.66 mg/dL (ref 0.50–1.10)
GLUCOSE: 94 mg/dL (ref 70–99)
POTASSIUM: 4.1 meq/L (ref 3.7–5.3)
Sodium: 137 mEq/L (ref 137–147)
Total Protein: 8 g/dL (ref 6.0–8.3)

## 2014-01-07 LAB — PREGNANCY, URINE: Preg Test, Ur: NEGATIVE

## 2014-01-07 MED ORDER — ONDANSETRON HCL 4 MG/2ML IJ SOLN
4.0000 mg | Freq: Once | INTRAMUSCULAR | Status: AC
  Administered 2014-01-07: 4 mg via INTRAVENOUS
  Filled 2014-01-07: qty 2

## 2014-01-07 MED ORDER — MORPHINE SULFATE 4 MG/ML IJ SOLN
4.0000 mg | Freq: Once | INTRAMUSCULAR | Status: AC
  Administered 2014-01-07: 4 mg via INTRAVENOUS
  Filled 2014-01-07: qty 1

## 2014-01-07 MED ORDER — ONDANSETRON HCL 4 MG PO TABS: 4.0000 mg | ORAL_TABLET | Freq: Four times a day (QID) | ORAL | Status: DC

## 2014-01-07 MED ORDER — HYDROCODONE-ACETAMINOPHEN 5-325 MG PO TABS: 1.0000 | ORAL_TABLET | Freq: Four times a day (QID) | ORAL | Status: DC | PRN

## 2014-01-07 NOTE — Discharge Instructions (Signed)
Follow up with your doctor for continued or worsening symptoms Gastritis, Adult Gastritis is soreness and swelling (inflammation) of the lining of the stomach. Gastritis can develop as a sudden onset (acute) or long-term (chronic) condition. If gastritis is not treated, it can lead to stomach bleeding and ulcers. CAUSES  Gastritis occurs when the stomach lining is weak or damaged. Digestive juices from the stomach then inflame the weakened stomach lining. The stomach lining may be weak or damaged due to viral or bacterial infections. One common bacterial infection is the Helicobacter pylori infection. Gastritis can also result from excessive alcohol consumption, taking certain medicines, or having too much acid in the stomach.  SYMPTOMS  In some cases, there are no symptoms. When symptoms are present, they may include:  Pain or a burning sensation in the upper abdomen.  Nausea.  Vomiting.  An uncomfortable feeling of fullness after eating. DIAGNOSIS  Your caregiver may suspect you have gastritis based on your symptoms and a physical exam. To determine the cause of your gastritis, your caregiver may perform the following:  Blood or stool tests to check for the H pylori bacterium.  Gastroscopy. A thin, flexible tube (endoscope) is passed down the esophagus and into the stomach. The endoscope has a light and camera on the end. Your caregiver uses the endoscope to view the inside of the stomach.  Taking a tissue sample (biopsy) from the stomach to examine under a microscope. TREATMENT  Depending on the cause of your gastritis, medicines may be prescribed. If you have a bacterial infection, such as an H pylori infection, antibiotics may be given. If your gastritis is caused by too much acid in the stomach, H2 blockers or antacids may be given. Your caregiver may recommend that you stop taking aspirin, ibuprofen, or other nonsteroidal anti-inflammatory drugs (NSAIDs). HOME CARE  INSTRUCTIONS  Only take over-the-counter or prescription medicines as directed by your caregiver.  If you were given antibiotic medicines, take them as directed. Finish them even if you start to feel better.  Drink enough fluids to keep your urine clear or pale yellow.  Avoid foods and drinks that make your symptoms worse, such as:  Caffeine or alcoholic drinks.  Chocolate.  Peppermint or mint flavorings.  Garlic and onions.  Spicy foods.  Citrus fruits, such as oranges, lemons, or limes.  Tomato-based foods such as sauce, chili, salsa, and pizza.  Fried and fatty foods.  Eat small, frequent meals instead of large meals. SEEK IMMEDIATE MEDICAL CARE IF:   You have black or dark red stools.  You vomit blood or material that looks like coffee grounds.  You are unable to keep fluids down.  Your abdominal pain gets worse.  You have a fever.  You do not feel better after 1 week.  You have any other questions or concerns. MAKE SURE YOU:  Understand these instructions.  Will watch your condition.  Will get help right away if you are not doing well or get worse. Document Released: 08/15/2001 Document Revised: 02/20/2012 Document Reviewed: 10/04/2011 [facility] Patient Information 2014 [place], [place].

## 2014-01-07 NOTE — ED Provider Notes (Signed)
CSN: [id number]     Arrival date & time 01/07/14  1000 History   First MD Initiated Contact with Patient 01/07/14 1003     Chief Complaint  Patient presents with  . Abdominal Pain     (Consider location/radiation/quality/duration/timing/severity/associated sxs/prior Treatment) HPI Comments: Pt c/o epigastric pain, diarrhea and nausea times 4 days. Pt denies fever or vomiting. Hasn't taken anything over the counter for the diarrhea. Nothing makes the pain better or worse.no bloody stool. No previous abdominal surgeries.  The history is provided by the patient. No language interpreter was used.    Past Medical History  Diagnosis Date  . Back pain   . History of stomach ulcers    History reviewed. No pertinent past surgical history. Family History  Problem Relation Age of Onset  . COPD Other    History  Substance Use Topics  . Smoking status: Current Every Day Smoker -- 1.00 packs/day for 9 years    Types: Cigarettes  . Smokeless tobacco: Not on file  . Alcohol Use: Yes     Comment: occas   OB History   Grav Para Term Preterm Abortions TAB SAB Ect Mult Living                 Review of Systems  Constitutional: Negative.   Respiratory: Negative.   Cardiovascular: Negative.       Allergies  Review of patient's allergies indicates no known allergies.  Home Medications   Prior to Admission medications   Medication Sig Start Date End Date Taking? Authorizing Provider  HYDROcodone-acetaminophen (NORCO/VICODIN) 5-325 MG per tablet Take 1-2 tablets every 6 hours as needed for severe pain 06/14/13   Renne CriglerJoshua Geiple, PA-C  ibuprofen (ADVIL,MOTRIN) 200 MG tablet Take 1,000 mg by mouth every 6 (six) hours as needed for pain.     Historical Provider, MD  levonorgestrel-ethinyl estradiol (SEASONALE,INTROVALE,JOLESSA) 0.15-0.03 MG tablet Take 1 tablet by mouth at bedtime.    Historical Provider, MD  OVER THE COUNTER MEDICATION Take 2 tablets by mouth every 8 (eight) hours as needed  (for pain). Tylenol Back and Body    Historical Provider, MD   BP 131/95  Pulse 97  Temp(Src) 99.3 F (37.4 C) (Oral)  Resp 16  Ht 5\' 1"  (1.549 m)  Wt 200 lb (90.719 kg)  BMI 37.81 kg/m2  SpO2 99%  LMP 11/19/2013 Physical Exam  Nursing note and vitals reviewed. Constitutional: She is oriented to person, place, and time. She appears well-nourished.  Cardiovascular: Normal rate and regular rhythm.   Pulmonary/Chest: Effort normal and breath sounds normal.  Abdominal: Soft. Bowel sounds are normal. There is tenderness in the epigastric area.  Musculoskeletal: Normal range of motion.  Neurological: She is alert and oriented to person, place, and time.  Skin: Skin is warm.  Psychiatric: She has a normal mood and affect.    ED Course  Procedures (including critical care time) Labs Review Labs Reviewed  URINALYSIS, ROUTINE W REFLEX MICROSCOPIC - Abnormal; Notable for the following:    Hgb urine dipstick SMALL (*)    All other components within normal limits  CBC WITH DIFFERENTIAL - Abnormal; Notable for the following:    WBC 14.1 (*)    Hemoglobin 16.3 (*)    Neutrophils Relative % 78 (*)    Neutro Abs 11.0 (*)    All other components within normal limits  COMPREHENSIVE METABOLIC PANEL - Abnormal; Notable for the following:    Total Bilirubin 0.2 (*)    All other components  within normal limits  URINE MICROSCOPIC-ADD ON - Abnormal; Notable for the following:    Squamous Epithelial / LPF MANY (*)    Bacteria, UA MANY (*)    All other components within normal limits  URINE CULTURE  PREGNANCY, URINE  LIPASE, BLOOD    Imaging Review No results found.   EKG Interpretation None      MDM   Final diagnoses:  Gastritis    Pt is feeling better and tolerating po. Will send home with vicodin and zofran for symptomatic treamtent. Pt is okay with plan and given follow up instructions    Teressa LowerVrinda Hoa Briggs, NP 01/07/14 1203

## 2014-01-07 NOTE — ED Notes (Signed)
Pt received discharge instructions and prescriptions, verbalized understanding and has no further questions. Pt ambulated to exit in stable condition accompanied by mother.   

## 2014-01-07 NOTE — ED Notes (Signed)
Epigastric pain with n/d x 4 days.  Denies vomiting.

## 2014-01-08 LAB — URINE CULTURE: Colony Count: >=100000

## 2014-01-08 NOTE — ED Provider Notes (Signed)
Medical screening examination/treatment/procedure(s) were conducted as a shared visit with non-physician practitioner(s) and myself.  I personally evaluated the patient during the encounter.   EKG Interpretation None     No acute abdomen at discharge. Tenderness in the epigastric area.  Donnetta HutchingBrian Miguel Medal, MD 01/08/14 [phone]

## 2018-01-25 DIAGNOSIS — I10 Essential (primary) hypertension: Secondary | ICD-10-CM | POA: Insufficient documentation

## 2018-04-08 DIAGNOSIS — Z8632 Personal history of gestational diabetes: Secondary | ICD-10-CM | POA: Insufficient documentation

## 2018-05-23 DIAGNOSIS — O24419 Gestational diabetes mellitus in pregnancy, unspecified control: Secondary | ICD-10-CM

## 2018-05-23 DIAGNOSIS — O1490 Unspecified pre-eclampsia, unspecified trimester: Secondary | ICD-10-CM

## 2019-08-05 ENCOUNTER — Other Ambulatory Visit: Payer: Self-pay

## 2019-08-05 ENCOUNTER — Ambulatory Visit (INDEPENDENT_AMBULATORY_CARE_PROVIDER_SITE_OTHER): Payer: No Typology Code available for payment source

## 2019-08-05 ENCOUNTER — Ambulatory Visit
Admission: EM | Admit: 2019-08-05 | Discharge: 2019-08-05 | Disposition: A | Discharge status: Home / Self Care | Payer: No Typology Code available for payment source | Attending: Emergency Medicine | Admitting: Emergency Medicine

## 2019-08-05 DIAGNOSIS — T07XXXA Unspecified multiple injuries, initial encounter: Secondary | ICD-10-CM

## 2019-08-05 DIAGNOSIS — M25511 Pain in right shoulder: Secondary | ICD-10-CM

## 2019-08-05 DIAGNOSIS — M7581 Other shoulder lesions, right shoulder: Secondary | ICD-10-CM

## 2019-08-05 DIAGNOSIS — S4991XA Unspecified injury of right shoulder and upper arm, initial encounter: Secondary | ICD-10-CM | POA: Diagnosis not present

## 2019-08-05 DIAGNOSIS — W19XXXA Unspecified fall, initial encounter: Secondary | ICD-10-CM

## 2019-08-05 MED ORDER — CYCLOBENZAPRINE HCL 10 MG PO TABS: 10.0000 mg | ORAL_TABLET | Freq: Every day | ORAL | 0 refills | Status: DC

## 2019-08-05 MED ORDER — MELOXICAM 7.5 MG PO TABS: 7.5000 mg | ORAL_TABLET | Freq: Every day | ORAL | 0 refills | Status: DC

## 2019-08-05 NOTE — Discharge Instructions (Signed)
X-rays negative for fracture or dislocation Continue conservative management of rest, ice, and gentle stretches Take mobic as needed for pain relief (may cause abdominal discomfort, ulcers, and GI bleeds avoid taking with other NSAIDs) Take cyclobenzaprine at nighttime for symptomatic relief. Avoid driving or operating heavy machinery while using medication. Follow up with ortho if symptoms persist Return or go to the ER if you have any new or worsening symptoms (fever, chills, swelling, redness, bruising, numbness/ tingling, etc...)

## 2019-08-05 NOTE — ED Provider Notes (Signed)
[facility]   [id number] 08/05/19 Arrival Time: 1450  CC: Right shoulder pain/ injury  SUBJECTIVE: History from: patient. Allison Garcia is a 28 y.o. female complains of right shoulder pain/ injury that began 1 day ago.  Symptoms began after she tripped and fell over her dogs. Unsure how injury occurred, but complains of multiple scratches and RT shoulder pain.  Localizes the pain to the top of right shoulder.  Describes the pain as constant, achy, but intermittently achy in character.  Has tried OTC medications without relief.  Symptoms are made worse with raising arm overhead.  Denies similar symptoms in the past. Complains of LROM about the RT shoulder.  Denies fever, chills, erythema, ecchymosis, effusion, weakness, numbness and tingling.  ROS: As per HPI.  All other pertinent ROS negative.     Past Medical History:  Diagnosis Date  . Back pain   . History of stomach ulcers    History reviewed. No pertinent surgical history. Allergies  Allergen Reactions  . Other Anaphylaxis and Shortness Of Breath    Bleach Bleach    No current facility-administered medications on file prior to encounter.    Current Outpatient Medications on File Prior to Encounter  Medication Sig Dispense Refill  . ibuprofen (ADVIL,MOTRIN) 200 MG tablet Take 1,000 mg by mouth every 6 (six) hours as needed for pain.     . [DISCONTINUED] levonorgestrel-ethinyl estradiol (SEASONALE,INTROVALE,JOLESSA) 0.15-0.03 MG tablet Take 1 tablet by mouth at bedtime.     Social History   Socioeconomic History  . Marital status: Single    Spouse name: Not on file  . Number of children: Not on file  . Years of education: Not on file  . Highest education level: Not on file  Occupational History  . Not on file  Social Needs  . Financial resource strain: Not on file  . Food insecurity    Worry: Not on file    Inability: Not on file  . Transportation needs    Medical: Not on file    Non-medical: Not on  file  Tobacco Use  . Smoking status: Current Every Day Smoker    Packs/day: 1.00    Years: 9.00    Pack years: 9.00    Types: Cigarettes  . Smokeless tobacco: Never Used  Substance and Sexual Activity  . Alcohol use: Yes    Comment: occas  . Drug use: No  . Sexual activity: Yes    Birth control/protection: Pill  Lifestyle  . Physical activity    Days per week: Not on file    Minutes per session: Not on file  . Stress: Not on file  Relationships  . Social Musician on phone: Not on file    Gets together: Not on file    Attends religious service: Not on file    Active member of club or organization: Not on file    Attends meetings of clubs or organizations: Not on file    Relationship status: Not on file  . Intimate partner violence    Fear of current or ex partner: Not on file    Emotionally abused: Not on file    Physically abused: Not on file    Forced sexual activity: Not on file  Other Topics Concern  . Not on file  Social History Narrative  . Not on file   Family History  Problem Relation Age of Onset  . COPD Other     OBJECTIVE:  Vitals:  08/05/19 1507  BP: 132/87  Pulse: 95  Resp: 18  Temp: 98.4 F (36.9 C)  SpO2: 96%    General appearance: ALERT; in no acute distress.  Head: NCAT Lungs: Normal respiratory effort CV: Radial pulse 2+. Cap refill < 2 seconds Musculoskeletal: RT arm Inspection: Skin warm, dry, clear and intact without obvious erythema, effusion, or ecchymosis.  Palpation: TTP over RT superior trapezius ROM: LROM about the RT shoulder Strength: 4+/5 shld abduction, 5/5 shld adduction, 5/5 elbow flexion, 5/5 elbow extension, 5/5 grip strength Skin: warm and dry; multiple superficial abrasions, scratches to RT ear, and medial RT wirst Neurologic: Ambulates without difficulty; Sensation intact about the upper extremities Psychological: alert and cooperative; normal mood and affect  DIAGNOSTIC STUDIES:  Dg Shoulder Right   Result Date: 08/05/2019 CLINICAL DATA:  Right shoulder pain after injury. Knocked to the floor by dog with right shoulder pain. EXAM: RIGHT SHOULDER - 2+ VIEW COMPARISON:  None. FINDINGS: There is no evidence of fracture or dislocation. There is no evidence of arthropathy or other focal bone abnormality. Soft tissues are unremarkable. IMPRESSION: Negative radiographs of the right shoulder. Electronically Signed   By: Keith Rake M.D.   On: 08/05/2019 15:29    X-rays negative for bony abnormalities including fracture, or dislocation.  No soft tissue swelling.    I have reviewed the x-rays myself and the radiologist interpretation. I am in agreement with the radiologist interpretation.     ASSESSMENT & PLAN:  1. Injury of right shoulder, initial encounter   2. Rotator cuff tendinitis, right   3. Fall, initial encounter     Meds ordered this encounter  Medications  . meloxicam (MOBIC) 7.5 MG tablet    Sig: Take 1 tablet (7.5 mg total) by mouth daily.    Dispense:  30 tablet    Refill:  0    Order Specific Question:   Supervising Provider    Answer:   Raylene Everts [[id number]]  . cyclobenzaprine (FLEXERIL) 10 MG tablet    Sig: Take 1 tablet (10 mg total) by mouth at bedtime.    Dispense:  15 tablet    Refill:  0    Order Specific Question:   Supervising Provider    Answer:   Raylene Everts [[id number]]   X-rays negative for fracture or dislocation Continue conservative management of rest, ice, and gentle stretches Take mobic as needed for pain relief (may cause abdominal discomfort, ulcers, and GI bleeds avoid taking with other NSAIDs) Take cyclobenzaprine at nighttime for symptomatic relief. Avoid driving or operating heavy machinery while using medication. Follow up with ortho if symptoms persist Return or go to the ER if you have any new or worsening symptoms (fever, chills, swelling, redness, bruising, numbness/ tingling, etc...)   Reviewed expectations re: course of  current medical issues. Questions answered. Outlined signs and symptoms indicating need for more acute intervention. Patient verbalized understanding. After Visit Summary given.    Lestine Box, PA-C 08/05/19 1559

## 2019-08-05 NOTE — ED Triage Notes (Signed)
Pt was knocked down by dogs last night, pt having pain in right wrist and right shoulder with limited range of motion in shoulder

## 2019-10-16 ENCOUNTER — Other Ambulatory Visit: Payer: Self-pay

## 2019-10-16 ENCOUNTER — Ambulatory Visit
Admission: EM | Admit: 2019-10-16 | Discharge: 2019-10-16 | Disposition: A | Discharge status: Home / Self Care | Payer: No Typology Code available for payment source | Attending: Emergency Medicine | Admitting: Emergency Medicine

## 2019-10-16 DIAGNOSIS — R0981 Nasal congestion: Secondary | ICD-10-CM | POA: Insufficient documentation

## 2019-10-16 DIAGNOSIS — H9203 Otalgia, bilateral: Secondary | ICD-10-CM | POA: Diagnosis present

## 2019-10-16 DIAGNOSIS — J029 Acute pharyngitis, unspecified: Secondary | ICD-10-CM | POA: Insufficient documentation

## 2019-10-16 DIAGNOSIS — H65193 Other acute nonsuppurative otitis media, bilateral: Secondary | ICD-10-CM | POA: Insufficient documentation

## 2019-10-16 LAB — POCT RAPID STREP A (OFFICE): Rapid Strep A Screen: NEGATIVE

## 2019-10-16 MED ORDER — AMOXICILLIN-POT CLAVULANATE 875-125 MG PO TABS: 1.0000 | ORAL_TABLET | Freq: Two times a day (BID) | ORAL | 0 refills | Status: AC

## 2019-10-16 NOTE — ED Provider Notes (Signed)
[place]   [id number] 10/16/19 Arrival Time: 9326   CC: URI symptoms  SUBJECTIVE: History from: patient.  Allison Garcia is a 29 y.o. female who presents with fatigue, sinus congestion, left ear pain, sore throat, and cough x 3 days ago.  Denies known COVID exposure. Works as a Chartered certified accountant for [employer].  Had negative COVID test at [facility] today.  Has tried OTC medications without relief.  Denies previous COVID infection.   Denies fever, chills, rhinorrhea, SOB, wheezing, chest pain, nausea, vomiting, changes in bowel or bladder habits.    ROS: As per HPI.  All other pertinent ROS negative.     Past Medical History:  Diagnosis Date  . Back pain   . History of stomach ulcers    History reviewed. No pertinent surgical history. Allergies  Allergen Reactions  . Other Anaphylaxis and Shortness Of Breath    Bleach Bleach    No current facility-administered medications on file prior to encounter.   Current Outpatient Medications on File Prior to Encounter  Medication Sig Dispense Refill  . ibuprofen (ADVIL,MOTRIN) 200 MG tablet Take 1,000 mg by mouth every 6 (six) hours as needed for pain.     . [DISCONTINUED] levonorgestrel-ethinyl estradiol (SEASONALE,INTROVALE,JOLESSA) 0.15-0.03 MG tablet Take 1 tablet by mouth at bedtime.     Social History   Socioeconomic History  . Marital status: Single    Spouse name: Not on file  . Number of children: Not on file  . Years of education: Not on file  . Highest education level: Not on file  Occupational History  . Not on file  Tobacco Use  . Smoking status: Current Every Day Smoker    Packs/day: 1.00    Years: 9.00    Pack years: 9.00    Types: Cigarettes  . Smokeless tobacco: Never Used  Substance and Sexual Activity  . Alcohol use: Yes    Comment: occas  . Drug use: No  . Sexual activity: Yes    Birth control/protection: Pill  Other Topics Concern  . Not on file  Social History Narrative  . Not on file    Social Determinants of Health   Financial Resource Strain:   . Difficulty of Paying Living Expenses: Not on file  Food Insecurity:   . Worried About Charity fundraiser in the Last Year: Not on file  . Ran Out of Food in the Last Year: Not on file  Transportation Needs:   . Lack of Transportation (Medical): Not on file  . Lack of Transportation (Non-Medical): Not on file  Physical Activity:   . Days of Exercise per Week: Not on file  . Minutes of Exercise per Session: Not on file  Stress:   . Feeling of Stress : Not on file  Social Connections:   . Frequency of Communication with Friends and Family: Not on file  . Frequency of Social Gatherings with Friends and Family: Not on file  . Attends Religious Services: Not on file  . Active Member of Clubs or Organizations: Not on file  . Attends Archivist Meetings: Not on file  . Marital Status: Not on file  Intimate Partner Violence:   . Fear of Current or Ex-Partner: Not on file  . Emotionally Abused: Not on file  . Physically Abused: Not on file  . Sexually Abused: Not on file   Family History  Problem Relation Age of Onset  . COPD Other     OBJECTIVE:  Vitals:  10/16/19 1256  BP: 118/80  Pulse: 85  Resp: 18  Temp: 98.4 F (36.9 C)  SpO2: 96%     General appearance: alert; appears fatigued, but nontoxic; speaking in full sentences and tolerating own secretions HEENT: NCAT; Ears: EACs clear, RT TM injected, LT TM appears tense, but not erythematous; Eyes: PERRL.  EOM grossly intact. Sinuses: nontender; Nose: nares patent without rhinorrhea, Throat: oropharynx clear, tonsils erythematous and mildly enlarged, uvula midline  Neck: supple without LAD Lungs: unlabored respirations, symmetrical air entry; cough: absent; no respiratory distress; CTAB Heart: regular rate and rhythm.   Skin: warm and dry Psychological: alert and cooperative; normal mood and affect  LABS:  Results for orders placed or performed  during the hospital encounter of 10/16/19 (from the past 24 hour(s))  POCT rapid strep A     Status: None   Collection Time: 10/16/19  1:06 PM  Result Value Ref Range   Rapid Strep A Screen Negative Negative    ASSESSMENT & PLAN:  1. Other non-recurrent acute nonsuppurative otitis media of both ears   2. Ear pain, bilateral   3. Sinus congestion   4. Sore throat     Meds ordered this encounter  Medications  . amoxicillin-clavulanate (AUGMENTIN) 875-125 MG tablet    Sig: Take 1 tablet by mouth every 12 (twelve) hours for 10 days.    Dispense:  20 tablet    Refill:  0    Order Specific Question:   Supervising Provider    Answer:   Eustace Moore [[id number]]    Send-out COVID test negative  Strep negative.  Culture sent we will call you with abnormal results Get plenty of rest and push fluids Augmentin prescribed for ear infection.  Take as directed and completion Use OTC medications like ibuprofen or tylenol as needed fever or pain Call or go to the ED if you have any new or worsening symptoms such as fever, cough, shortness of breath, chest tightness, chest pain, turning blue, changes in mental status, etc...   Reviewed expectations re: course of current medical issues. Questions answered. Outlined signs and symptoms indicating need for more acute intervention. Patient verbalized understanding. After Visit Summary given.         Rennis Harding, PA-C 10/16/19 1331

## 2019-10-16 NOTE — Discharge Instructions (Addendum)
Send-out COVID test negative  Strep negative.  Culture sent we will call you with abnormal results Get plenty of rest and push fluids Augmentin prescribed for ear infection.  Take as directed and completion Use OTC medications like ibuprofen or tylenol as needed fever or pain Call or go to the ED if you have any new or worsening symptoms such as fever, cough, shortness of breath, chest tightness, chest pain, turning blue, changes in mental status, etc..Marland Kitchen

## 2019-10-16 NOTE — ED Triage Notes (Signed)
Pts covid test at [facility] was negative

## 2019-10-16 NOTE — ED Triage Notes (Signed)
Pt has had sinus congestion since Monday  , also has left ear pain and sore throat

## 2019-10-18 LAB — CULTURE, GROUP A STREP (THRC)

## 2019-11-17 ENCOUNTER — Emergency Department (HOSPITAL_COMMUNITY): Payer: No Typology Code available for payment source

## 2019-11-17 ENCOUNTER — Other Ambulatory Visit: Payer: Self-pay

## 2019-11-17 ENCOUNTER — Emergency Department (HOSPITAL_COMMUNITY)
Admission: EM | Admit: 2019-11-17 | Discharge: 2019-11-17 | Disposition: A | Discharge status: Home / Self Care | Payer: No Typology Code available for payment source | Attending: Emergency Medicine | Admitting: Emergency Medicine

## 2019-11-17 ENCOUNTER — Encounter (HOSPITAL_COMMUNITY): Payer: Self-pay | Admitting: Emergency Medicine

## 2019-11-17 DIAGNOSIS — R030 Elevated blood-pressure reading, without diagnosis of hypertension: Secondary | ICD-10-CM | POA: Diagnosis not present

## 2019-11-17 DIAGNOSIS — N939 Abnormal uterine and vaginal bleeding, unspecified: Secondary | ICD-10-CM | POA: Insufficient documentation

## 2019-11-17 DIAGNOSIS — F17210 Nicotine dependence, cigarettes, uncomplicated: Secondary | ICD-10-CM | POA: Diagnosis not present

## 2019-11-17 DIAGNOSIS — R109 Unspecified abdominal pain: Secondary | ICD-10-CM

## 2019-11-17 DIAGNOSIS — R1013 Epigastric pain: Secondary | ICD-10-CM | POA: Insufficient documentation

## 2019-11-17 DIAGNOSIS — Z79899 Other long term (current) drug therapy: Secondary | ICD-10-CM | POA: Insufficient documentation

## 2019-11-17 DIAGNOSIS — R110 Nausea: Secondary | ICD-10-CM | POA: Insufficient documentation

## 2019-11-17 LAB — LIPASE, BLOOD: Lipase: 25 U/L (ref 11–51)

## 2019-11-17 LAB — URINALYSIS, ROUTINE W REFLEX MICROSCOPIC
Bacteria, UA: NONE SEEN
Bilirubin Urine: NEGATIVE
Glucose, UA: NEGATIVE mg/dL
Ketones, ur: NEGATIVE mg/dL
Leukocytes,Ua: NEGATIVE
Nitrite: NEGATIVE
Protein, ur: NEGATIVE mg/dL
Specific Gravity, Urine: 1.008 (ref 1.005–1.030)
pH: 5 (ref 5.0–8.0)

## 2019-11-17 LAB — COMPREHENSIVE METABOLIC PANEL
ALT: 17 U/L (ref 0–44)
AST: 17 U/L (ref 15–41)
Albumin: 3.7 g/dL (ref 3.5–5.0)
Alkaline Phosphatase: 43 U/L (ref 38–126)
Anion gap: 10 (ref 5–15)
BUN: 14 mg/dL (ref 6–20)
CO2: 23 mmol/L (ref 22–32)
Calcium: 9.5 mg/dL (ref 8.9–10.3)
Chloride: 108 mmol/L (ref 98–111)
Creatinine, Ser: 0.63 mg/dL (ref 0.44–1.00)
GFR calc Af Amer: >60 mL/min (ref >60)
GFR calc non Af Amer: >60 mL/min (ref >60)
Glucose, Bld: 93 mg/dL (ref 70–99)
Potassium: 4 mmol/L (ref 3.5–5.1)
Sodium: 141 mmol/L (ref 135–145)
Total Bilirubin: 0.5 mg/dL (ref 0.3–1.2)
Total Protein: 7.4 g/dL (ref 6.5–8.1)

## 2019-11-17 LAB — CBC WITH DIFFERENTIAL/PLATELET
Abs Immature Granulocytes: 0.06 10*3/uL (ref 0.00–0.07)
Basophils Absolute: 0.1 10*3/uL (ref 0.0–0.1)
Basophils Relative: 0 %
Eosinophils Absolute: 0 10*3/uL (ref 0.0–0.5)
Eosinophils Relative: 0 %
HCT: 47.4 % — ABNORMAL HIGH (ref 36.0–46.0)
Hemoglobin: 16 g/dL — ABNORMAL HIGH (ref 12.0–15.0)
Immature Granulocytes: 0 %
Lymphocytes Relative: 19 %
Lymphs Abs: 2.6 10*3/uL (ref 0.7–4.0)
MCH: 31.8 pg (ref 26.0–34.0)
MCHC: 33.8 g/dL (ref 30.0–36.0)
MCV: 94.2 fL (ref 80.0–100.0)
Monocytes Absolute: 0.7 10*3/uL (ref 0.1–1.0)
Monocytes Relative: 5 %
Neutro Abs: 10.5 10*3/uL — ABNORMAL HIGH (ref 1.7–7.7)
Neutrophils Relative %: 76 %
Platelets: 266 10*3/uL (ref 150–400)
RBC: 5.03 MIL/uL (ref 3.87–5.11)
RDW: 12.8 % (ref 11.5–15.5)
WBC: 13.9 10*3/uL — ABNORMAL HIGH (ref 4.0–10.5)
nRBC: 0 % (ref 0.0–0.2)

## 2019-11-17 LAB — POC URINE PREG, ED: Preg Test, Ur: NEGATIVE

## 2019-11-17 MED ORDER — SODIUM CHLORIDE 0.9 % IV BOLUS
1000.0000 mL | Freq: Once | INTRAVENOUS | Status: AC
  Administered 2019-11-17: 1000 mL via INTRAVENOUS

## 2019-11-17 MED ORDER — ALUM & MAG HYDROXIDE-SIMETH 200-200-20 MG/5ML PO SUSP
30.0000 mL | Freq: Once | ORAL | Status: AC
Start: 2019-11-17 — End: 2019-11-17
  Administered 2019-11-17: 13:00:00 30 mL via ORAL
  Filled 2019-11-17: qty 30

## 2019-11-17 MED ORDER — ONDANSETRON HCL 4 MG/2ML IJ SOLN
4.0000 mg | Freq: Once | INTRAMUSCULAR | Status: AC
  Administered 2019-11-17: 12:00:00 4 mg via INTRAVENOUS
  Filled 2019-11-17: qty 2

## 2019-11-17 MED ORDER — PANTOPRAZOLE SODIUM 40 MG PO TBEC: 40.0000 mg | DELAYED_RELEASE_TABLET | Freq: Every day | ORAL | 0 refills | Status: DC

## 2019-11-17 MED ORDER — SUCRALFATE 1 GM/10ML PO SUSP: 1.0000 g | Freq: Three times a day (TID) | ORAL | 0 refills | Status: DC

## 2019-11-17 MED ORDER — LIDOCAINE VISCOUS HCL 2 % MT SOLN
15.0000 mL | Freq: Once | OROMUCOSAL | Status: AC
  Administered 2019-11-17: 15 mL via ORAL
  Filled 2019-11-17: qty 15

## 2019-11-17 MED ORDER — MORPHINE SULFATE (PF) 4 MG/ML IV SOLN
4.0000 mg | Freq: Once | INTRAVENOUS | Status: AC
  Administered 2019-11-17: 4 mg via INTRAVENOUS
  Filled 2019-11-17: qty 1

## 2019-11-17 MED ORDER — ONDANSETRON 4 MG PO TBDP: 4.0000 mg | ORAL_TABLET | Freq: Three times a day (TID) | ORAL | 0 refills | Status: DC | PRN

## 2019-11-17 MED ORDER — FAMOTIDINE IN NACL 20-0.9 MG/50ML-% IV SOLN
20.0000 mg | Freq: Once | INTRAVENOUS | Status: AC
  Administered 2019-11-17: 20 mg via INTRAVENOUS
  Filled 2019-11-17: qty 50

## 2019-11-17 NOTE — ED Provider Notes (Signed)
[facility] EMERGENCY DEPARTMENT Provider Note   CSN: [id number] Arrival date & time: 11/17/19  9485     History Chief Complaint  Patient presents with  . Abdominal Pain    Allison Garcia is a 29 y.o. female with a history of tobacco abuse & gastric ulcers who presents to the ED with complaints of abdominal pain that worsened over the past 24 hours. Patient states that she has been having problems with intermittent abdominal pain for about 6 months, but pain seemed much worse since yesterday. Patient states pain is located in the epigastric region, it is a constant dull ache with intermittent sharp/stabbing pains. She states it is aggravated by eating sometimes, especially with lettuce, also with supine position at times, can be worsened without eating. No alleviating factors. Tried gas-x and omeprazole without much relief. Has had associated nausea without vomiting. Denies fever, chills, emesis, diarrhea, melena, hematochezia, dysuria, urgency, or frequency. Having some mild vaginal spotting, has IUD- irregular periods with this, denies vaginal discharge. She states she was diagnosed with gastric ulcers years ago and this does not feel like that, she does not believe she ever had an EGD done. Reports occasional NSAID & EtOH, none in excess, not daily. Not on blood thinners.   HPI     Past Medical History:  Diagnosis Date  . Back pain   . History of stomach ulcers     There are no problems to display for this patient.   History reviewed. No pertinent surgical history.   OB History    Gravida      Para      Term      Preterm      AB      Living  1     SAB      TAB      Ectopic      Multiple      Live Births              Family History  Problem Relation Age of Onset  . COPD Other     Social History   Tobacco Use  . Smoking status: Current Every Day Smoker    Packs/day: 1.00    Years: 9.00    Pack years: 9.00    Types: Cigarettes  . Smokeless  tobacco: Never Used  Substance Use Topics  . Alcohol use: Yes    Comment: occas  . Drug use: No    Home Medications Prior to Admission medications   Medication Sig Start Date End Date Taking? Authorizing Provider  ibuprofen (ADVIL,MOTRIN) 200 MG tablet Take 1,000 mg by mouth every 6 (six) hours as needed for pain.     [provider]  levonorgestrel-ethinyl estradiol (SEASONALE,INTROVALE,JOLESSA) 0.15-0.03 MG tablet Take 1 tablet by mouth at bedtime.  08/05/19  [provider]    Allergies    Other  Review of Systems   Review of Systems  Constitutional: Negative for chills and fever.  Respiratory: Negative for shortness of breath.   Cardiovascular: Negative for chest pain.  Gastrointestinal: Positive for abdominal pain and nausea. Negative for anal bleeding, blood in stool, constipation, diarrhea and vomiting.  Genitourinary: Positive for vaginal bleeding. Negative for dysuria and vaginal discharge.  Neurological: Negative for syncope.  All other systems reviewed and are negative.   Physical Exam Updated Vital Signs BP (!) 144/91 (BP Location: Right Arm)   Pulse 81   Temp 98.7 F (37.1 C) (Oral)   Resp 18  Ht 5\' 1"  (1.549 m)   Wt 90.7 kg   LMP 11/15/2019   SpO2 95%   BMI 37.79 kg/m   Physical Exam Vitals and nursing note reviewed.  Constitutional:      General: She is not in acute distress.    Appearance: She is well-developed. She is not toxic-appearing.  HENT:     Head: Normocephalic and atraumatic.  Eyes:     General:        Right eye: No discharge.        Left eye: No discharge.     Conjunctiva/sclera: Conjunctivae normal.  Cardiovascular:     Rate and Rhythm: Normal rate and regular rhythm.  Pulmonary:     Effort: Pulmonary effort is normal. No respiratory distress.     Breath sounds: Normal breath sounds. No wheezing, rhonchi or rales.  Abdominal:     General: There is no distension.     Palpations: Abdomen is soft.     Tenderness:  There is abdominal tenderness in the right upper quadrant and epigastric area. There is no guarding or rebound. Negative signs include Murphy's sign and McBurney's sign.  Musculoskeletal:     Cervical back: Neck supple.  Skin:    General: Skin is warm and dry.     Findings: No rash.  Neurological:     Mental Status: She is alert.     Comments: Clear speech.   Psychiatric:        Behavior: Behavior normal.    ED Results / Procedures / Treatments   Labs (all labs ordered are listed, but only abnormal results are displayed) Labs Reviewed  CBC WITH DIFFERENTIAL/PLATELET - Abnormal; Notable for the following components:      Result Value   WBC 13.9 (*)    Hemoglobin 16.0 (*)    HCT 47.4 (*)    Neutro Abs 10.5 (*)    All other components within normal limits  COMPREHENSIVE METABOLIC PANEL  LIPASE, BLOOD  URINALYSIS, ROUTINE W REFLEX MICROSCOPIC  POC URINE PREG, ED    EKG None  Radiology 11/17/2019 Abdomen Limited RUQ  Result Date: 11/17/2019 CLINICAL DATA:  Upper abdominal pain EXAM: ULTRASOUND ABDOMEN LIMITED RIGHT UPPER QUADRANT COMPARISON:  May 07, 2008. FINDINGS: Gallbladder: No gallstones or wall thickening visualized. There is no pericholecystic fluid. No sonographic Murphy sign noted by sonographer. Common bile duct: Diameter: 3 mm. No intrahepatic or extrahepatic biliary duct dilatation. Liver: No focal lesion identified. Within normal limits in parenchymal echogenicity. Portal vein is patent on color Doppler imaging with normal direction of blood flow towards the liver. Other: None. IMPRESSION: Study within normal limits. Electronically Signed   By: May 09[birth year] 2008 III M.D.   On: 11/17/2019 11:34    Procedures Procedures (including critical care time)  Medications Ordered in ED Medications  sodium chloride 0.9 % bolus 1,000 mL (has no administration in time range)  ondansetron (ZOFRAN) injection 4 mg (has no administration in time range)  morphine 4 MG/ML injection 4  mg (has no administration in time range)  famotidine (PEPCID) IVPB 20 mg premix (has no administration in time range)    ED Course  I have reviewed the triage vital signs and the nursing notes.  Pertinent labs & imaging results that were available during my care of the patient were reviewed by me and considered in my medical decision making (see chart for details).    Allison Garcia was evaluated in Emergency Department on 11/17/2019 for the symptoms described in the history  of present illness. He/she was evaluated in the context of the global COVID-19 pandemic, which necessitated consideration that the patient might be at risk for infection with the SARS-CoV-2 virus that causes COVID-19. Institutional protocols and algorithms that pertain to the evaluation of patients at risk for COVID-19 are in a state of rapid change based on information released by regulatory bodies including the CDC and federal and state organizations. These policies and algorithms were followed during the patient's care in the ED.  MDM Rules/Calculators/A&P                      Patient presents to the ED with complaints of abdominal pain. Patient nontoxic appearing, in no apparent distress, vitals WNL other than elevated BP- doubt HTN emergency. On exam patient tender to the epigastrium primarily but also some to the RUQ, no peritoneal signs. Will evaluate with labs and RUQ [place]. Analgesics, anti-emetics, and fluids administered. Chart reviewed for additional history.   ER work-up reviewed & interpreted:  CBC: Mild leukocytosis similar to prior labs on record on chart review. No anemia, hgb/hct somewhat elevated.  CMP: No electrolyte derangement, LFTs WNL. Renal function preserved.  Lipase: WNL UA: hgb present, 0-5 RBCs, patient with some vaginal spotting.  Preg test: Negative.  RUQ [place]: Study within normal limits.   On repeat abdominal exam patient remains without peritoneal signs, doubt cholecystitis, cholangitis,  pancreatitis, diverticulitis, appendicitis, bowel obstruction/perforation, PID or ectopic pregnancy. Hgb/hct elevated, BUN WNL, no reports of blood in stool or hematemesis- do not suspect significant upper GI bleed. Unclear definitive etiology- suspect GERD vs. PUD. Will start protonix, carafate, zofran, and PCP vs. GI follow up. Diet guidelines. Patient tolerating PO in the emergency department, appears appropriate for discharge home. I discussed results, treatment plan, need for PCP follow-up, and return precautions with the patient. Provided opportunity for questions, patient confirmed understanding and is in agreement with plan.   Final Clinical Impression(s) / ED Diagnoses Final diagnoses:  Abdominal pain    Rx / DC Orders ED Discharge Orders         Ordered    pantoprazole (PROTONIX) 40 MG tablet  Daily     11/17/19 1336    sucralfate (CARAFATE) 1 GM/10ML suspension  3 times daily with meals & bedtime     11/17/19 1336    ondansetron (ZOFRAN ODT) 4 MG disintegrating tablet  Every 8 hours PRN     11/17/19 1336           Taralyn Ferraiolo, Glynda Jaeger, PA-C 11/17/19 1339    Davonna Belling, MD 11/17/19 1514

## 2019-11-17 NOTE — Discharge Instructions (Signed)
You were seen in the emergency department today for abdominal pain.  Your work-up was overall reassuring.  Your ultrasound of your gallbladder area was normal.  Your labs did not show any significant problems with your liver function, kidney function, or pancreas.  Your white blood cell count and hemoglobin were mildly elevated, please have these rechecked by your primary care provider.  This may be gastroesophageal reflux disease and/or peptic ulcer disease.  Please follow attached diet guidelines.  Please take Zofran every 8 hours as needed for nausea and vomiting, Protonix daily in the morning prior to meals to help with stomach acidity, and Carafate prior to meals and at bedtime to help coat the stomach as well.  We have prescribed you new medication(s) today. Discuss the medications prescribed today with your pharmacist as they can have adverse effects and interactions with your other medicines including over the counter and prescribed medications. Seek medical evaluation if you start to experience new or abnormal symptoms after taking one of these medicines, seek care immediately if you start to experience difficulty breathing, feeling of your throat closing, facial swelling, or rash as these could be indications of a more serious allergic reaction  Please follow-up with GI within 1 week.  Return to the emergency department for new or worsening symptoms including but not limited to increased pain, inability to keep fluids down, fever, blood in stool, blood in your vomit, or any other concerns.

## 2019-11-17 NOTE — ED Triage Notes (Signed)
PT c/o upper abdominal pain with nausea that started last night. Last BM this am. PT denies any urinary symptoms.

## 2019-11-24 ENCOUNTER — Telehealth: Payer: Self-pay | Admitting: Internal Medicine

## 2019-11-24 NOTE — Telephone Encounter (Signed)
Ok to schedule ov.  

## 2019-11-24 NOTE — Telephone Encounter (Signed)
Er referral  ?

## 2019-11-24 NOTE — Telephone Encounter (Signed)
PATIENT SCHEDULED  °

## 2019-11-27 ENCOUNTER — Encounter: Payer: Self-pay | Admitting: *Deleted

## 2019-11-27 ENCOUNTER — Encounter: Payer: Self-pay | Admitting: Gastroenterology

## 2019-11-27 ENCOUNTER — Ambulatory Visit (INDEPENDENT_AMBULATORY_CARE_PROVIDER_SITE_OTHER): Payer: No Typology Code available for payment source | Admitting: Gastroenterology

## 2019-11-27 ENCOUNTER — Telehealth: Payer: Self-pay | Admitting: *Deleted

## 2019-11-27 ENCOUNTER — Other Ambulatory Visit: Payer: Self-pay

## 2019-11-27 DIAGNOSIS — R1013 Epigastric pain: Secondary | ICD-10-CM | POA: Diagnosis not present

## 2019-11-27 NOTE — Telephone Encounter (Signed)
Clinicals faxed

## 2019-11-27 NOTE — Telephone Encounter (Signed)
Called [facility]. HIDA and EGD requires PA. I need to fax clinicals to [phone].

## 2019-11-27 NOTE — Patient Instructions (Addendum)
Continue Protonix once daily, 30 minutes before breakfast.  We have ordered a HIDA scan to assess gallbladder function.  We have also ordered an upper endoscopy in the near future.  Avoid Ibuprofen for now. I have also included a GERD handout for you.  We will see you back in follow-up after procedure!  It was a pleasure to see you today. I want to create trusting relationships with patients to provide genuine, compassionate, and quality care. I value your feedback. If you receive a survey regarding your visit,  I greatly appreciate you taking time to fill this out.   Allison Needs, PhD, ANP-BC [facility]    Gastroesophageal Reflux Disease, Adult Gastroesophageal reflux (GER) happens when acid from the stomach flows up into the tube that connects the mouth and the stomach (esophagus). Normally, food travels down the esophagus and stays in the stomach to be digested. However, when a person has GER, food and stomach acid sometimes move back up into the esophagus. If this becomes a more serious problem, the person may be diagnosed with a disease called gastroesophageal reflux disease (GERD). GERD occurs when the reflux:  Happens often.  Causes frequent or severe symptoms.  Causes problems such as damage to the esophagus. When stomach acid comes in contact with the esophagus, the acid may cause soreness (inflammation) in the esophagus. Over time, GERD may create small holes (ulcers) in the lining of the esophagus. What are the causes? This condition is caused by a problem with the muscle between the esophagus and the stomach (lower esophageal sphincter, or LES). Normally, the LES muscle closes after food passes through the esophagus to the stomach. When the LES is weakened or abnormal, it does not close properly, and that allows food and stomach acid to go back up into the esophagus. The LES can be weakened by certain dietary substances, medicines, and medical conditions,  including:  Tobacco use.  Pregnancy.  Having a hiatal hernia.  Alcohol use.  Certain foods and beverages, such as coffee, chocolate, onions, and peppermint. What increases the risk? You are more likely to develop this condition if you:  Have an increased body weight.  Have a connective tissue disorder.  Use NSAID medicines. What are the signs or symptoms? Symptoms of this condition include:  Heartburn.  Difficult or painful swallowing.  The feeling of having a lump in the throat.  Abitter taste in the mouth.  Bad breath.  Having a large amount of saliva.  Having an upset or bloated stomach.  Belching.  Chest pain. Different conditions can cause chest pain. Make sure you see your health care provider if you experience chest pain.  Shortness of breath or wheezing.  Ongoing (chronic) cough or a night-time cough.  Wearing away of tooth enamel.  Weight loss. How is this diagnosed? Your health care provider will take a medical history and perform a physical exam. To determine if you have mild or severe GERD, your health care provider may also monitor how you respond to treatment. You may also have tests, including:  A test to examine your stomach and esophagus with a small camera (endoscopy).  A test thatmeasures the acidity level in your esophagus.  A test thatmeasures how much pressure is on your esophagus.  A barium swallow or modified barium swallow test to show the shape, size, and functioning of your esophagus. How is this treated? The goal of treatment is to help relieve your symptoms and to prevent complications. Treatment for this condition  may vary depending on how severe your symptoms are. Your health care provider may recommend:  Changes to your diet.  Medicine.  Surgery. Follow these instructions at home: Eating and drinking   Follow a diet as recommended by your health care provider. This may involve avoiding foods and drinks such  as: ? Coffee and tea (with or without caffeine). ? Drinks that containalcohol. ? Energy drinks and sports drinks. ? Carbonated drinks or sodas. ? Chocolate and cocoa. ? Peppermint and mint flavorings. ? Garlic and onions. ? Horseradish. ? Spicy and acidic foods, including peppers, chili powder, curry powder, vinegar, hot sauces, and barbecue sauce. ? Citrus fruit juices and citrus fruits, such as oranges, lemons, and limes. ? Tomato-based foods, such as red sauce, chili, salsa, and pizza with red sauce. ? Fried and fatty foods, such as donuts, french fries, potato chips, and high-fat dressings. ? High-fat meats, such as hot dogs and fatty cuts of red and white meats, such as rib eye steak, sausage, ham, and bacon. ? High-fat dairy items, such as whole milk, butter, and cream cheese.  Eat small, frequent meals instead of large meals.  Avoid drinking large amounts of liquid with your meals.  Avoid eating meals during the 2-3 hours before bedtime.  Avoid lying down right after you eat.  Do not exercise right after you eat. Lifestyle   Do not use any products that contain nicotine or tobacco, such as cigarettes, e-cigarettes, and chewing tobacco. If you need help quitting, ask your health care provider.  Try to reduce your stress by using methods such as yoga or meditation. If you need help reducing stress, ask your health care provider.  If you are overweight, reduce your weight to an amount that is healthy for you. Ask your health care provider for guidance about a safe weight loss goal. General instructions  Pay attention to any changes in your symptoms.  Take over-the-counter and prescription medicines only as told by your health care provider. Do not take aspirin, ibuprofen, or other NSAIDs unless your health care provider told you to do so.  Wear loose-fitting clothing. Do not wear anything tight around your waist that causes pressure on your abdomen.  Raise (elevate) the  head of your bed about 6 inches (15 cm).  Avoid bending over if this makes your symptoms worse.  Keep all follow-up visits as told by your health care provider. This is important. Contact a health care provider if:  You have: ? New symptoms. ? Unexplained weight loss. ? Difficulty swallowing or it hurts to swallow. ? Wheezing or a persistent cough. ? A hoarse voice.  Your symptoms do not improve with treatment. Get help right away if you:  Have pain in your arms, neck, jaw, teeth, or back.  Feel sweaty, dizzy, or light-headed.  Have chest pain or shortness of breath.  Vomit and your vomit looks like blood or coffee grounds.  Faint.  Have stool that is bloody or black.  Cannot swallow, drink, or eat. Summary  Gastroesophageal reflux happens when acid from the stomach flows up into the esophagus. GERD is a disease in which the reflux happens often, causes frequent or severe symptoms, or causes problems such as damage to the esophagus.  Treatment for this condition may vary depending on how severe your symptoms are. Your health care provider may recommend diet and lifestyle changes, medicine, or surgery.  Contact a health care provider if you have new or worsening symptoms.  Take over-the-counter and prescription  medicines only as told by your health care provider. Do not take aspirin, ibuprofen, or other NSAIDs unless your health care provider told you to do so.  Keep all follow-up visits as told by your health care provider. This is important. This information is not intended to replace advice given to you by your health care provider. Make sure you discuss any questions you have with your health care provider. Document Revised: 02/27/2018 Document Reviewed: 02/27/2018 Elsevier Patient Education  2020 [employer].

## 2019-11-27 NOTE — Progress Notes (Signed)
No pcp per patient 

## 2019-11-27 NOTE — Progress Notes (Signed)
Primary Care Physician:  Patient, No Pcp Per  Referring Physician: [facility] Primary Gastroenterologist:  Dr. Jena Gauss   Chief Complaint  Patient presents with  . Abdominal Pain    mid upper abd; sometimes ruq around to back; went to ED 11/17/19    HPI:   Allison Garcia is a 29 y.o. female presenting today at the request of [facility] due to abdominal pain. RUQ [place] March 2021 normal. No gallstones. WBC count 13.9, Hgb 16. Lipase 25. LFTs normal. 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   Noted epigastric pain that doubled her over in Oct/November while in a patient room. Passed in 15-20 minutes. Would happen intermittently. Sunday night/Monday morning recurrent and worse than labor. Pain was located in epigastric region and radiating RUQ, but also felt like a knife in LUQ stabbing her. Now with dull, constant pain in epigastric region. Was recommended to stop drinking caffeine. Drank a 1/2 can of pepsi on way to [facility] then had the stabbing pain again in epigastric/LUQ. Rare nausea. No vomiting. If eats lettuce, notices it triggers the pain. Greasy food doesn't bother her. States she lives off of Pepsi. No typical reflux symptoms. Used to have chronic symptoms of indigestion but not recently. Would take OTC medications.   Takes ibuprofen every morning when getting home from work. No melena. No weight loss or loss of appetite. No dysphagia. Started Protonix from the ED on 3/15. Varies when she takes it. If not working night shift will take in the mornings. Taking without food.   States she was told by the ED many years ago that she had an ulcer. States this is not the same pain as many years ago.    Past Medical History:  Diagnosis Date  . Abnormal Pap smear of cervix   . Back pain   . History of stomach ulcers    told by ED. no EGD  . Preeclampsia     Past Surgical History:  Procedure Laterality Date  . CESAREAN SECTION    . TEAR DUCT PROBING     as baby    Current  Outpatient Medications  Medication Sig Dispense Refill  . ibuprofen (ADVIL,MOTRIN) 200 MG tablet Take 1,000 mg by mouth every 6 (six) hours as needed for pain.     Marland Kitchen ondansetron (ZOFRAN ODT) 4 MG disintegrating tablet Take 1 tablet (4 mg total) by mouth every 8 (eight) hours as needed for nausea or vomiting. 5 tablet 0  . pantoprazole (PROTONIX) 40 MG tablet Take 1 tablet (40 mg total) by mouth daily. 30 tablet 0  . sucralfate (CARAFATE) 1 GM/10ML suspension Take 10 mLs (1 g total) by mouth 4 (four) times daily -  with meals and at bedtime. 420 mL 0   No current facility-administered medications for this visit.    Allergies as of 11/27/2019 - Review Complete 11/27/2019  Allergen Reaction Noted  . Other Anaphylaxis and Shortness Of Breath 03/11/2019    Family History  Problem Relation Age of Onset  . COPD Other   . Gallbladder disease Mother   . Colon cancer Neg Hx   . Colon polyps Neg Hx     Social History   Socioeconomic History  . Marital status: Single    Spouse name: Not on file  . Number of children: Not on file  . Years of education: Not on file  . Highest education level: Not on file  Occupational History  .  Occupation: Nurse Tech    Comment: [employer]  Tobacco Use  . Smoking status: Current Every Day Smoker    Packs/day: 1.00    Years: 9.00    Pack years: 9.00    Types: Cigarettes  . Smokeless tobacco: Never Used  Substance and Sexual Activity  . Alcohol use: Yes    Comment: occas  . Drug use: No  . Sexual activity: Yes    Birth control/protection: Pill  Other Topics Concern  . Not on file  Social History Narrative  . Not on file   Social Determinants of Health   Financial Resource Strain:   . Difficulty of Paying Living Expenses:   Food Insecurity:   . Worried About Charity fundraiser in the Last Year:   . Arboriculturist in the Last Year:   Transportation Needs:   . Film/video editor (Medical):   Marland Kitchen Lack of Transportation (Non-Medical):    Physical Activity:   . Days of Exercise per Week:   . Minutes of Exercise per Session:   Stress:   . Feeling of Stress :   Social Connections:   . Frequency of Communication with Friends and Family:   . Frequency of Social Gatherings with Friends and Family:   . Attends Religious Services:   . Active Member of Clubs or Organizations:   . Attends Archivist Meetings:   Marland Kitchen Marital Status:   Intimate Partner Violence:   . Fear of Current or Ex-Partner:   . Emotionally Abused:   Marland Kitchen Physically Abused:   . Sexually Abused:     Review of Systems: Gen: Denies any fever, chills, fatigue, weight loss, lack of appetite.  CV: Denies chest pain, heart palpitations, peripheral edema, syncope.  Resp: Denies shortness of breath at rest or with exertion. Denies wheezing or cough.  GI: see HPI GU : Denies urinary burning, urinary frequency, urinary hesitancy MS: Denies joint pain, muscle weakness, cramps, or limitation of movement.  Derm: Denies rash, itching, dry skin Psych: Denies depression, anxiety, memory loss, and confusion Heme: Denies bruising, bleeding, and enlarged lymph nodes.  Physical Exam: BP 129/87   Pulse 74   Temp (!) 97.1 F (36.2 C) (Temporal)   Ht 5\' 1"  (1.549 m)   Wt 208 lb 6.4 oz (94.5 kg)   LMP 11/15/2019 (Approximate)   BMI 39.38 kg/m  General:   Alert and oriented. Pleasant and cooperative. Well-nourished and well-developed.  Head:  Normocephalic and atraumatic. Eyes:  Without icterus, sclera clear and conjunctiva pink.  Ears:  Normal auditory acuity. Mouth:  Mask in place Lungs:  Clear to auscultation bilaterally. No wheezes, rales, or rhonchi. No distress.  Heart:  S1, S2 present without murmurs appreciated.  Abdomen:  +BS, soft, mild epigastric TTP and non-distended. No HSM noted. No guarding or rebound. No masses appreciated.  Rectal:  Deferred  Msk:  Symmetrical without gross deformities. Normal posture. Extremities:  Without  edema. Neurologic:  Alert and  oriented x4;  grossly normal neurologically. Skin:  Intact without significant lesions or rashes. Psych:  Alert and cooperative. Normal mood and affect.  ASSESSMENT: Allison Garcia is a 29 y.o. female presenting today with several month history of intermittent abdominal pain located primarily in epigastric region but radiating to RUQ and LUQ at times, worsening in frequency and severity. Gallbladder present. Labs including CBC, CMP normal, and US abdomen limited without gallstones.   In setting of NSAID use, query possible gastritis/ulcer as culprit; biliary etiology  less likely but unable to be ruled out. She would like to pursue further gallbladder evaluation in interim while waiting on EGD to be completed.   Will pursue EGD with Propofol in near future. HIDA in interim. Agree with PPI daily, avoidance of NSAIDs, and  GERD behavior/diet modifications.    PLAN:  Proceed with upper endoscopy in the near future with Dr. Jena Gauss using Propofol. The risks, benefits, and alternatives have been discussed in detail with patient. They have stated understanding and desire to proceed.   Protonix daily  HIDA scan in future  GERD diet provided.   Avoid NSAIDs.   Follow-up after procedure.   Gelene Mink, PhD, ANP-BC [facility]

## 2019-12-01 NOTE — Telephone Encounter (Signed)
Called [facility] and PA is still pending.

## 2019-12-02 NOTE — Telephone Encounter (Signed)
I called the RN case manager back advising the date for EGD is for 02/05/20. Will await updated auth

## 2019-12-02 NOTE — Telephone Encounter (Signed)
EGD authorization has been updated. Approval # [id number] dates 02/05/2020-03/04/2020

## 2019-12-02 NOTE — Telephone Encounter (Addendum)
PA approved for both procedures. Auth# [id number] dates 02/25/2020-03/24/2020 for EGD, HIDA [id number] dates 12/03/2019-12/31/2019  HIDA scheduled for 3/31 at 8:00am, arrival 7:45am, npo midnight, no pain meds  Called patient to give appt information. She stated she needed to talk to someone about the dye that is used. She had a spinal injection done several years ago and had reaction to dye that was used. I provided patient with nuc med # to call. She will do so. She will let us know if she wants to have the test done or not.  Called [facility] and spoke with nurse Bea Laura and advised EGD is scheduled for 02/05/20. She will get auths fixed.

## 2019-12-03 ENCOUNTER — Encounter (HOSPITAL_COMMUNITY): Payer: No Typology Code available for payment source

## 2019-12-10 ENCOUNTER — Other Ambulatory Visit: Payer: Self-pay

## 2019-12-10 ENCOUNTER — Emergency Department (HOSPITAL_COMMUNITY)
Admission: EM | Admit: 2019-12-10 | Discharge: 2019-12-10 | Disposition: A | Discharge status: Home / Self Care | Payer: PRIVATE HEALTH INSURANCE | Attending: Emergency Medicine | Admitting: Emergency Medicine

## 2019-12-10 ENCOUNTER — Encounter (HOSPITAL_COMMUNITY): Payer: Self-pay | Admitting: Emergency Medicine

## 2019-12-10 DIAGNOSIS — M545 Low back pain, unspecified: Secondary | ICD-10-CM

## 2019-12-10 DIAGNOSIS — M5489 Other dorsalgia: Secondary | ICD-10-CM | POA: Diagnosis present

## 2019-12-10 DIAGNOSIS — F17210 Nicotine dependence, cigarettes, uncomplicated: Secondary | ICD-10-CM | POA: Insufficient documentation

## 2019-12-10 DIAGNOSIS — Z79899 Other long term (current) drug therapy: Secondary | ICD-10-CM | POA: Insufficient documentation

## 2019-12-10 MED ORDER — METHOCARBAMOL 750 MG PO TABS: 750.0000 mg | ORAL_TABLET | Freq: Every evening | ORAL | 0 refills | Status: AC | PRN

## 2019-12-10 MED ORDER — PREDNISONE 10 MG (21) PO TBPK: ORAL_TABLET | Freq: Every day | ORAL | 0 refills | Status: DC

## 2019-12-10 MED ORDER — KETOROLAC TROMETHAMINE 30 MG/ML IJ SOLN
30.0000 mg | Freq: Once | INTRAMUSCULAR | Status: AC
  Administered 2019-12-10: 30 mg via INTRAMUSCULAR
  Filled 2019-12-10: qty 1

## 2019-12-10 NOTE — Discharge Instructions (Signed)
You were given a prescription for Robaxin which is a muscle relaxer.  You should not drive, work, or operate machinery while taking this medication as it can make you very drowsy.    Take prednisone as directed.   Follow up with your primary care doctor or with the back doctor that was provided for you on your discharge paperwork.  Return to the emergency department immediately if you experience any back pain associated with fevers, loss of control of your bowels/bladder, weakness/numbness to your legs, numbness to your groin area, inability to walk, or inability to urinate.

## 2019-12-10 NOTE — ED Provider Notes (Signed)
[facility] EMERGENCY DEPARTMENT Provider Note   CSN: [id number] Arrival date & time: 12/10/19  2020     History Chief Complaint  Patient presents with  . Back Pain    Allison Garcia is a 29 y.o. female.  HPI   Pt is a 29 y/o female with a h/o back pain who presents to the ED today for eval of back pain. 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 While she was positioning the patient, she felt a pull in her back. Pain worsened since onset. Pain worse with certain movements. States that she now feels like she is having some sensation changes to the RLE and she feels like it is going to give out on her. She was able to ambulate to her room from the waiting room.  Denies saddle anesthesia. Denies loss of control of bowels or bladder. No urinary retention. No fevers. Denies a h/o IVDU. Denies a h/o CA or recent unintended weight loss.  She has tried taking tylenol and used topical voltaren gel without significant relief.  Past Medical History:  Diagnosis Date  . Abnormal Pap smear of cervix   . Back pain   . History of stomach ulcers    told by ED. no EGD  . Preeclampsia     Patient Active Problem List   Diagnosis Date Noted  . Dyspepsia 11/27/2019    Past Surgical History:  Procedure Laterality Date  . CESAREAN SECTION    . TEAR DUCT PROBING     as baby     OB History    Gravida      Para      Term      Preterm      AB      Living  1     SAB      TAB      Ectopic      Multiple      Live Births              Family History  Problem Relation Age of Onset  . COPD Other   . Gallbladder disease Mother   . Colon cancer Neg Hx   . Colon polyps Neg Hx     Social History   Tobacco Use  . Smoking status: Current Every Day Smoker    Packs/day: 1.00    Years: 9.00    Pack years: 9.00    Types: Cigarettes  . Smokeless tobacco: Never Used  Substance Use Topics  . Alcohol use: Yes    Comment: occas  .  Drug use: No    Home Medications Prior to Admission medications   Medication Sig Start Date End Date Taking? Authorizing Provider  ibuprofen (ADVIL,MOTRIN) 200 MG tablet Take 1,000 mg by mouth every 6 (six) hours as needed for pain.     [provider]  methocarbamol (ROBAXIN) 750 MG tablet Take 1 tablet (750 mg total) by mouth at bedtime as needed for up to 5 days for muscle spasms. 12/10/19 12/15/19  Dhriti Fales S, PA-C  ondansetron (ZOFRAN ODT) 4 MG disintegrating tablet Take 1 tablet (4 mg total) by mouth every 8 (eight) hours as needed for nausea or vomiting. 11/17/19   Petrucelli, Samantha R, PA-C  pantoprazole (PROTONIX) 40 MG tablet Take 1 tablet (40 mg total) by mouth daily. 11/17/19   Petrucelli, Samantha R, PA-C  predniSONE (STERAPRED UNI-PAK 21 TAB) 10 MG (21) TBPK tablet Take by  mouth daily. Take 6 tabs by mouth daily  for 2 days, then 5 tabs for 2 days, then 4 tabs for 2 days, then 3 tabs for 2 days, 2 tabs for 2 days, then 1 tab by mouth daily for 2 days 12/10/19   Ellie Bryand S, PA-C  sucralfate (CARAFATE) 1 GM/10ML suspension Take 10 mLs (1 g total) by mouth 4 (four) times daily -  with meals and at bedtime. 11/17/19   Petrucelli, Pleas Koch, PA-C  levonorgestrel-ethinyl estradiol (SEASONALE,INTROVALE,JOLESSA) 0.15-0.03 MG tablet Take 1 tablet by mouth at bedtime.  08/05/19  [provider]    Allergies    Other  Review of Systems   Review of Systems  Constitutional: Negative for fever.  Respiratory: Negative for shortness of breath.   Cardiovascular: Negative for chest pain.  Gastrointestinal: Negative for abdominal pain.  Genitourinary: Negative for dysuria and hematuria.  Musculoskeletal: Positive for back pain.  Skin: Negative for color change.  Neurological: Positive for weakness and numbness.  All other systems reviewed and are negative.   Physical Exam Updated Vital Signs BP 124/80 (BP Location: Left Arm)   Pulse 97   Temp 98.5 F (36.9 C)  (Oral)   Resp 16   Ht 5\' 1"  (1.549 m)   Wt 90.7 kg   LMP 11/15/2019 (Approximate)   SpO2 98%   BMI 37.79 kg/m   Physical Exam Vitals and nursing note reviewed.  Constitutional:      General: She is not in acute distress.    Appearance: She is well-developed.  HENT:     Head: Normocephalic and atraumatic.  Eyes:     Conjunctiva/sclera: Conjunctivae normal.  Cardiovascular:     Rate and Rhythm: Normal rate and regular rhythm.     Heart sounds: No murmur.  Pulmonary:     Effort: Pulmonary effort is normal. No respiratory distress.     Breath sounds: Normal breath sounds.  Abdominal:     Palpations: Abdomen is soft.     Tenderness: There is no abdominal tenderness.  Musculoskeletal:     Cervical back: Neck supple.     Comments: TTP to the lower lumbar spine, to the right lumbar paraspinous muscles and to the right upper buttock  Skin:    General: Skin is warm and dry.  Neurological:     Mental Status: She is alert.     Comments: Motor:  Normal tone. 5/5 strength of BLE major muscle groups including strong dorsiflexion/plantar flexion Sensory: decreased sensation to the right lateral thigh down to the knee on the RLE, sensation to the RLE is otherwise wnl. LLE sensation is normal Gait: normal gait and balance.       ED Results / Procedures / Treatments   Labs (all labs ordered are listed, but only abnormal results are displayed) Labs Reviewed - No data to display  EKG None  Radiology No results found.  Procedures Procedures (including critical care time)  Medications Ordered in ED Medications  ketorolac (TORADOL) 30 MG/ML injection 30 mg (has no administration in time range)    ED Course  I have reviewed the triage vital signs and the nursing notes.  Pertinent labs & imaging results that were available during my care of the patient were reviewed by me and considered in my medical decision making (see chart for details).    MDM Rules/Calculators/A&P                       Patient with back  pain.  No neurological deficits and normal neuro exam.  Patient can walk but states is painful.  No loss of bowel or bladder control.  No concern for cauda equina.  No fever, night sweats, weight loss, h/o cancer, IVDU. Supportive therapy indicated and discussed with patient.    Final Clinical Impression(s) / ED Diagnoses Final diagnoses:  Acute low back pain, unspecified back pain laterality, unspecified whether sciatica present    Rx / DC Orders ED Discharge Orders         Ordered    predniSONE (STERAPRED UNI-PAK 21 TAB) 10 MG (21) TBPK tablet  Daily     12/10/19 2158    methocarbamol (ROBAXIN) 750 MG tablet  At bedtime PRN     12/10/19 2158           Rodney Booze, PA-C 12/10/19 2203    Lajean Saver, MD 12/11/19 1623

## 2019-12-10 NOTE — ED Triage Notes (Signed)
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

## 2019-12-17 ENCOUNTER — Ambulatory Visit: Payer: No Typology Code available for payment source | Admitting: Family Medicine

## 2019-12-31 ENCOUNTER — Ambulatory Visit: Payer: Self-pay

## 2019-12-31 ENCOUNTER — Ambulatory Visit (HOSPITAL_COMMUNITY): Payer: PRIVATE HEALTH INSURANCE | Attending: Occupational Medicine | Admitting: Physical Therapy

## 2019-12-31 ENCOUNTER — Other Ambulatory Visit: Payer: Self-pay | Admitting: Occupational Medicine

## 2019-12-31 ENCOUNTER — Other Ambulatory Visit: Payer: Self-pay

## 2019-12-31 ENCOUNTER — Encounter (HOSPITAL_COMMUNITY): Payer: Self-pay | Admitting: Physical Therapy

## 2019-12-31 DIAGNOSIS — R2689 Other abnormalities of gait and mobility: Secondary | ICD-10-CM | POA: Diagnosis present

## 2019-12-31 DIAGNOSIS — M545 Low back pain, unspecified: Secondary | ICD-10-CM

## 2019-12-31 DIAGNOSIS — M6281 Muscle weakness (generalized): Secondary | ICD-10-CM | POA: Insufficient documentation

## 2019-12-31 DIAGNOSIS — R29898 Other symptoms and signs involving the musculoskeletal system: Secondary | ICD-10-CM | POA: Diagnosis present

## 2019-12-31 NOTE — Therapy (Signed)
[place], [place], [address] Phone: [phone]   Fax:  [phone]  Physical Therapy Evaluation  Patient Details  Name: Allison Garcia MRN: [id number] Date of Birth: 10-Apr-1991 Referring Provider (PT): Dannial Monarch MD   Encounter Date: 12/31/2019  PT End of Session - 12/31/19 1607    Visit Number  1    Number of Visits  8    Date for PT Re-Evaluation  01/28/20    Authorization Type  Workers Comp    Authorization - Visit Number  1    Authorization - Number of Visits  8    PT Start Time  1300    PT Stop Time  1345    PT Time Calculation (min)  45 min    Activity Tolerance  Patient tolerated treatment well    Behavior During Therapy  [facility] for tasks assessed/performed       Past Medical History:  Diagnosis Date  . Abnormal Pap smear of cervix   . Back pain   . History of stomach ulcers    told by ED. no EGD  . Preeclampsia     Past Surgical History:  Procedure Laterality Date  . CESAREAN SECTION    . TEAR DUCT PROBING     as baby    There were no vitals filed for this visit.   Subjective Assessment - 12/31/19 1301    Subjective  Patient is a 29 y.o. female who presents to physical therapy with c/o low back pain due to injury suffered at work on 12/10/19. Patient states she was helping to move a heavy patient while at work and she injured her back. She didn't notice pain much right after but once she woke up she noticed a pain, trembling, and RLE weakness. She has symptoms in her R glute down to knee She thinks she was pushing the patient when she injured herself. Sitting, standing, and changing positions don't help symptoms. Sitting in her tub with warm water and heated seat seem to help. Reaching forward, laying flat, sitting, and pushing increase symptoms. She tried to go back to work recently but was in too much pain. Her main goal is to make her back better. Patient denies changes in bowel and bladder and  numbness tingling in the groin region.    Pertinent History  hx back pain, physical job    Limitations  Lifting;Sitting;Standing;House hold activities    Patient Stated Goals  make her back better    Currently in Pain?  Yes    Pain Score  6    at worst 9/10   Pain Location  Back    Pain Descriptors / Indicators  Aching;Constant    Pain Type  Acute pain    Pain Onset  1 to 4 weeks ago         [facility] PT Assessment - 12/31/19 0001      Assessment   Medical Diagnosis  Sacrococcygeal Disorder/ LBP    Referring Provider (PT)  Dannial Monarch MD    Onset Date/Surgical Date  12/10/19    Next MD Visit  none    Prior Therapy  Yes      Precautions   Precautions  None      Restrictions   Weight Bearing Restrictions  No      Balance Screen   Has the patient fallen in the past 6 months  No    Has the patient had a decrease in  activity level because of a fear of falling?   No    Is the patient reluctant to leave their home because of a fear of falling?   No      Prior Function   Level of Independence  Independent    Vocation  Work[employer]    Leisure  hang out with kids, play      Cognition   Overall Cognitive Status  Within Functional Limits for tasks assessed      Observation/Other Assessments   Observations  Ambualtes without AD, slow, labored gait, guarded movements    Focus on Therapeutic Outcomes (FOTO)   62% limited      Sensation   Light Touch  Appears Intact      Posture/Postural Control   Posture/Postural Control  Postural limitations    Postural Limitations  Increased thoracic kyphosis;Increased lumbar lordosis      ROM / Strength   AROM / PROM / Strength  AROM;Strength      AROM   Overall AROM Comments  most pain with flexion    AROM Assessment Site  Lumbar    Lumbar Flexion  50% limited, pain in back and hip    Lumbar Extension  25% limited    Lumbar - Right Side Bend  0% limited    Lumbar - Left Side Bend  0% limited    Lumbar -  Right Rotation  0% limited    Lumbar - Left Rotation  0% limited      Strength   Overall Strength Comments  weakness on R    Strength Assessment Site  Hip;Knee;Ankle    Right/Left Hip  Right;Left    Right Hip Flexion  4-/5    Right Hip Extension  4-/5    Right Hip ABduction  4/5    Left Hip Flexion  5/5    Left Hip Extension  4+/5    Left Hip ABduction  5/5    Right/Left Knee  Right;Left    Right Knee Flexion  5/5    Right Knee Extension  4-/5    Left Knee Flexion  5/5    Left Knee Extension  5/5    Right/Left Ankle  Right;Left    Right Ankle Dorsiflexion  5/5    Left Ankle Dorsiflexion  5/5      Palpation   Spinal mobility  hypomobile thoracic, normal to hypermobile lumbar regin, CPAL2-L4 painful in R buttocks and back, L and R UPA L2-L3 pain in R buttock - concordant    SI assessment   AP R SIJ painful    Palpation comment  TTP lumbar paraspinals with greatest on R lower                Objective measurements completed on examination: See above findings.              PT Education - 12/31/19 1313    Education Details  Patient educated on exam findings, POC, scope of PT, LBP, remaining active and performing physical activity    Person(s) Educated  Patient    Methods  Explanation    Comprehension  Verbalized understanding       PT Short Term Goals - 12/31/19 1628      PT SHORT TERM GOAL #1   Title  Patient will be independent with HEP in order to improve functional outcomes.    Time  2    Period  Weeks    Status  New    Target Date  01/14/20      PT SHORT TERM GOAL #2   Title  Patient will report at least 25% improvement in symptoms for improved quality of life.    Time  2    Period  Weeks    Status  New    Target Date  01/14/20        PT Long Term Goals - 12/31/19 1628      PT LONG TERM GOAL #1   Title  Patient will report at least 75% improvement in symptoms for improved quality of life.    Time  4    Period  Weeks    Status  New     Target Date  01/28/20      PT LONG TERM GOAL #2   Title  Patient will improve FOTO score by at least 5 points in order to indicate improved tolerance to activity.    Time  4    Period  Weeks    Status  New    Target Date  01/28/20      PT LONG TERM GOAL #3   Title  Patient will report centralized symptoms with pain no greater than 1/10 with lumbar AROM for improved function with ADL.    Time  4    Period  Weeks    Status  New    Target Date  01/28/20      PT LONG TERM GOAL #4   Title  Patient will improve lumbar flexion and extension ROM by at least 25% in order to improve ability to return to work.    Time  4    Period  Weeks    Status  New    Target Date  01/28/20             Plan - 12/31/19 1617    Clinical Impression Statement  Patient is a 29 y.o. female who presents to physical therapy with c/o low back pain due to injury suffered at work on 12/10/19.  She presents with pain limited deficits in Lumbar and RLE strength, ROM, endurance, postural impairments, spinal mobility and functional mobility with ADL. She is having to modify and restrict ADL as indicated by FOTO score as well as subjective information and objective measures which is affecting overall participation. Patient will benefit from skilled physical therapy in order to improve function and reduce impairment.    Personal Factors and Comorbidities  Past/Current Experience;Comorbidity 1;Time since onset of injury/illness/exacerbation;Profession    Comorbidities  history back pain    Examination-Activity Limitations  Bed Mobility;Bend;Caring for Others;Carry;Lift;Locomotion Level;Squat;Transfers    Examination-Participation Restrictions  Cleaning;Driving;Laundry;Yard Work;Volunteer;Shop;Meal Prep    Stability/Clinical Decision Making  Evolving/Moderate complexity    Clinical Decision Making  Moderate    Rehab Potential  Good    PT Frequency  2x / week    PT Duration  4 weeks    PT Treatment/Interventions   ADLs/Self Care Home Management;Aquatic Therapy;Biofeedback;Cryotherapy;Electrical Stimulation;Iontophoresis 4mg /ml Dexamethasone;Moist Heat;Traction;Ultrasound;Parrafin;Fluidtherapy;DME Instruction;Gait training;Stair training;Functional mobility training;Therapeutic activities;Therapeutic exercise;Balance training;Neuromuscular re-education;Patient/family education;Orthotic Fit/Training;Manual techniques;Manual lymph drainage;Compression bandaging;Scar mobilization;Passive range of motion;Dry needling;Energy conservation;Taping;Splinting;Spinal Manipulations;Joint Manipulations    PT Next Visit Plan  initiate HEP, possibly begin manual therapy for pain relief, trial repeated movements, begin core and postural strengthing    PT Home Exercise Plan  initiate next session    Consulted and Agree with Plan of Care  Patient       Patient will benefit from skilled therapeutic intervention in  order to improve the following deficits and impairments:  Abnormal gait, Decreased activity tolerance, Decreased balance, Decreased mobility, Decreased range of motion, Decreased strength, Hypermobility, Hypomobility, Difficulty walking, Increased muscle spasms, Postural dysfunction, Improper body mechanics, Pain  Visit Diagnosis: Low back pain, unspecified back pain laterality, unspecified chronicity, unspecified whether sciatica present  Muscle weakness (generalized)  Other abnormalities of gait and mobility  Other symptoms and signs involving the musculoskeletal system     Problem List Patient Active Problem List   Diagnosis Date Noted  . Dyspepsia 11/27/2019    4:35 PM, 12/31/19 Wyman Songster PT, DPT Physical Therapist at [facility]   [facility] [facility] [address] [place], [place], [address] Phone: [phone]   Fax:  [phone]  Name: Allison Garcia MRN: [id number] Date of Birth: [birth year]

## 2020-01-06 ENCOUNTER — Encounter (HOSPITAL_COMMUNITY): Payer: Self-pay | Admitting: Physical Therapy

## 2020-01-06 ENCOUNTER — Other Ambulatory Visit: Payer: Self-pay

## 2020-01-06 ENCOUNTER — Ambulatory Visit (HOSPITAL_COMMUNITY): Payer: PRIVATE HEALTH INSURANCE | Attending: Occupational Medicine | Admitting: Physical Therapy

## 2020-01-06 DIAGNOSIS — M545 Low back pain, unspecified: Secondary | ICD-10-CM

## 2020-01-06 DIAGNOSIS — M6281 Muscle weakness (generalized): Secondary | ICD-10-CM | POA: Insufficient documentation

## 2020-01-06 DIAGNOSIS — R29898 Other symptoms and signs involving the musculoskeletal system: Secondary | ICD-10-CM | POA: Insufficient documentation

## 2020-01-06 DIAGNOSIS — R2689 Other abnormalities of gait and mobility: Secondary | ICD-10-CM | POA: Diagnosis present

## 2020-01-06 NOTE — Therapy (Signed)
[facility] [address] [place], [place], [address] Phone: [phone]   Fax:  [phone]  Physical Therapy Treatment  Patient Details  Name: Allison Garcia MRN: [id number] Date of Birth: 06/22/1991 Referring Provider (PT): Lucia Gaskins MD   Encounter Date: 01/06/2020  PT End of Session - 01/06/20 0810    Visit Number  2    Number of Visits  8    Date for PT Re-Evaluation  01/28/20    Authorization Type  Workers Comp    Authorization - Visit Number  2    Authorization - Number of Visits  8    PT Start Time  0807    PT Stop Time  0849    PT Time Calculation (min)  42 min    Activity Tolerance  Patient tolerated treatment well    Behavior During Therapy  [facility] for tasks assessed/performed       Past Medical History:  Diagnosis Date  . Abnormal Pap smear of cervix   . Back pain   . History of stomach ulcers    told by ED. no EGD  . Preeclampsia     Past Surgical History:  Procedure Laterality Date  . CESAREAN SECTION    . TEAR DUCT PROBING     as baby    There were no vitals filed for this visit.  Subjective Assessment - 01/06/20 0804    Subjective  Patient reports she has been taking her meds for her back and it hasn't been hurting as bad. She tried to stay moving as much as possible. It is not as bad as it was but that is how it was last time she was taking it as well.    Pertinent History  hx back pain, physical job    Limitations  Lifting;Sitting;Standing;House hold activities    Patient Stated Goals  make her back better    Currently in Pain?  Yes    Pain Score  4     Pain Location  Back    Pain Onset  1 to 4 weeks ago                       [facility] Adult PT Treatment/Exercise - 01/06/20 0001      Exercises   Exercises  Lumbar      Lumbar Exercises: Standing   Other Standing Lumbar Exercises  standing lumbar extensions 2x10      Lumbar Exercises: Prone   Other Prone Lumbar Exercises  prone press  ups 2x10      Manual Therapy   Manual Therapy  Soft tissue mobilization;Joint mobilization    Manual therapy comments  completed independly from all other aspects of treatment    Joint Mobilization  R UPA L2-L4 gradeII-III    Soft tissue mobilization  lumbar paraspinals             PT Education - 01/06/20 0809    Education Details  Patient educated on HEP, repeated extension and mobilization effects as well as low back pathology and monitoring symptoms.    Person(s) Educated  Patient    Methods  Explanation;Demonstration;Handout    Comprehension  Verbalized understanding;Returned demonstration       PT Short Term Goals - 01/06/20 0853      PT SHORT TERM GOAL #1   Title  Patient will be independent with HEP in order to improve functional outcomes.    Time  2  Period  Weeks    Status  On-going    Target Date  01/14/20      PT SHORT TERM GOAL #2   Title  Patient will report at least 25% improvement in symptoms for improved quality of life.    Time  2    Period  Weeks    Status  On-going    Target Date  01/14/20        PT Long Term Goals - 01/06/20 0854      PT LONG TERM GOAL #1   Title  Patient will report at least 75% improvement in symptoms for improved quality of life.    Time  4    Period  Weeks    Status  On-going      PT LONG TERM GOAL #2   Title  Patient will improve FOTO score by at least 5 points in order to indicate improved tolerance to activity.    Time  4    Period  Weeks    Status  On-going      PT LONG TERM GOAL #3   Title  Patient will report centralized symptoms with pain no greater than 1/10 with lumbar AROM for improved function with ADL.    Time  4    Period  Weeks    Status  On-going      PT LONG TERM GOAL #4   Title  Patient will improve lumbar flexion and extension ROM by at least 25% in order to improve ability to return to work.    Time  4    Period  Weeks    Status  On-going            Plan - 01/06/20 8338     Clinical Impression Statement  Patient does not experience a change in symptoms following repeated extensions in standing. Patient experiences reduction in low back symptoms following soft tissue mobilization to R lumbar paraspinals. Patient experiences reproduction of R lumbar and hip symptoms with mobilizations on R which causes a decrease in intensity of symptoms following mobilizations. Intensity returns to about the same after increased reps of mobilization. Patient states back/hip symptoms improve during prone press ups and she does not experience any symptoms with extension. Patient educated on repeated extension and mobilization effects as well as low back pathology and monitoring symptoms. Patient will continue to benefit from skilled physical therapy in order to improve symptoms and reduce impairment.    Personal Factors and Comorbidities  Past/Current Experience;Comorbidity 1;Time since onset of injury/illness/exacerbation;Profession    Comorbidities  history back pain    Examination-Activity Limitations  Bed Mobility;Bend;Caring for Others;Carry;Lift;Locomotion Level;Squat;Transfers    Examination-Participation Restrictions  Cleaning;Driving;Laundry;Yard Work;Volunteer;Shop;Meal Prep    Rehab Potential  Good    PT Frequency  2x / week    PT Duration  4 weeks    PT Treatment/Interventions  ADLs/Self Care Home Management;Aquatic Therapy;Biofeedback;Cryotherapy;Electrical Stimulation;Iontophoresis 4mg /ml Dexamethasone;Moist Heat;Traction;Ultrasound;Parrafin;Fluidtherapy;DME Instruction;Gait training;Stair training;Functional mobility training;Therapeutic activities;Therapeutic exercise;Balance training;Neuromuscular re-education;Patient/family education;Orthotic Fit/Training;Manual techniques;Manual lymph drainage;Compression bandaging;Scar mobilization;Passive range of motion;Dry needling;Energy conservation;Taping;Splinting;Spinal Manipulations;Joint Manipulations    PT Next Visit Plan  continue  manual therapy for pain relief, assess response to repeated movements, begin core and postural strengthing    PT Home Exercise Plan  5/4 repeated extension, press ups    Consulted and Agree with Plan of Care  Patient       Patient will benefit from skilled therapeutic intervention in order to improve the following deficits and impairments:  Abnormal gait,  Decreased activity tolerance, Decreased balance, Decreased mobility, Decreased range of motion, Decreased strength, Hypermobility, Hypomobility, Difficulty walking, Increased muscle spasms, Postural dysfunction, Improper body mechanics, Pain  Visit Diagnosis: Low back pain, unspecified back pain laterality, unspecified chronicity, unspecified whether sciatica present  Muscle weakness (generalized)  Other abnormalities of gait and mobility  Other symptoms and signs involving the musculoskeletal system     Problem List Patient Active Problem List   Diagnosis Date Noted  . Dyspepsia 11/27/2019    8:55 AM, 01/06/20 Wyman Songster PT, DPT Physical Therapist at [facility]  [facility] [address] [place], [place], [address] Phone: [phone]   Fax:  [phone]  Name: Allison Garcia MRN: [id number] Date of Birth: 1991-07-14

## 2020-01-06 NOTE — Patient Instructions (Signed)
Access Code: [email or web address] URL: [email or web address] Date: 01/06/2020 Prepared by: [facility] Gabrille Kilbride  Exercises [employer] Up - 3 x daily - 7 x weekly - 2 sets - 10 reps Standing Lumbar Extension - 1 x daily - 7 x weekly - 1 sets - 10 reps

## 2020-01-08 ENCOUNTER — Other Ambulatory Visit: Payer: Self-pay

## 2020-01-08 ENCOUNTER — Encounter (HOSPITAL_COMMUNITY): Payer: Self-pay | Admitting: Physical Therapy

## 2020-01-08 ENCOUNTER — Ambulatory Visit (HOSPITAL_COMMUNITY): Payer: PRIVATE HEALTH INSURANCE | Admitting: Physical Therapy

## 2020-01-08 DIAGNOSIS — M545 Low back pain, unspecified: Secondary | ICD-10-CM

## 2020-01-08 DIAGNOSIS — M6281 Muscle weakness (generalized): Secondary | ICD-10-CM

## 2020-01-08 DIAGNOSIS — R2689 Other abnormalities of gait and mobility: Secondary | ICD-10-CM

## 2020-01-08 DIAGNOSIS — R29898 Other symptoms and signs involving the musculoskeletal system: Secondary | ICD-10-CM

## 2020-01-08 NOTE — Therapy (Signed)
[facility] [address] [place], [place], [address] Phone: [phone]   Fax:  [phone]  Physical Therapy Treatment  Patient Details  Name: Allison Garcia MRN: [id number] Date of Birth: [birth year] Referring Provider (PT): Lucia Gaskins MD   Encounter Date: 01/08/2020  PT End of Session - 01/08/20 0821    Visit Number  3    Number of Visits  8    Date for PT Re-Evaluation  01/28/20    Authorization Type  Workers Comp    Authorization - Visit Number  3    Authorization - Number of Visits  8    PT Start Time  [phone]    PT Stop Time  0857    PT Time Calculation (min)  40 min    Activity Tolerance  Patient tolerated treatment well    Behavior During Therapy  [facility] for tasks assessed/performed       Past Medical History:  Diagnosis Date  . Abnormal Pap smear of cervix   . Back pain   . History of stomach ulcers    told by ED. no EGD  . Preeclampsia     Past Surgical History:  Procedure Laterality Date  . CESAREAN SECTION    . TEAR DUCT PROBING     as baby    There were no vitals filed for this visit.  Subjective Assessment - 01/08/20 0817    Subjective  Patient thinks what we did the other day was helpful. She is doing exercises 2-3x/day. She was very sore yesterday. Sitting isn't hurting like it was. Side/front of hip is no where near what it was.    Pertinent History  hx back pain, physical job    Limitations  Lifting;Sitting;Standing;House hold activities    Patient Stated Goals  make her back better    Currently in Pain?  Yes    Pain Score  2     Pain Location  Back    Pain Onset  1 to 4 weeks ago                       [facility] Adult PT Treatment/Exercise - 01/08/20 0001      Lumbar Exercises: Supine   Ab Set  10 reps;5 seconds    Pelvic Tilt  20 reps    Pelvic Tilt Limitations  anterior and posterior    Clam  15 reps    Clam Limitations  2 Sets bilateral    Bridge  10 reps    Bridge Limitations  3  sets with ab set      Lumbar Exercises: Prone   Other Prone Lumbar Exercises  prone press ups 2x10      Manual Therapy   Manual Therapy  Soft tissue mobilization;Joint mobilization    Manual therapy comments  completed independly from all other aspects of treatment    Joint Mobilization  R UPA L2-L4 gradeII-III    Soft tissue mobilization  lumbar paraspinals             PT Education - 01/08/20 0820    Education Details  Review of Hep, continuing HEP    Person(s) Educated  Patient    Methods  Explanation;Demonstration    Comprehension  Verbalized understanding;Returned demonstration       PT Short Term Goals - 01/06/20 0853      PT SHORT TERM GOAL #1   Title  Patient will be independent with HEP in order to  improve functional outcomes.    Time  2    Period  Weeks    Status  On-going    Target Date  01/14/20      PT SHORT TERM GOAL #2   Title  Patient will report at least 25% improvement in symptoms for improved quality of life.    Time  2    Period  Weeks    Status  On-going    Target Date  01/14/20        PT Long Term Goals - 01/06/20 0854      PT LONG TERM GOAL #1   Title  Patient will report at least 75% improvement in symptoms for improved quality of life.    Time  4    Period  Weeks    Status  On-going      PT LONG TERM GOAL #2   Title  Patient will improve FOTO score by at least 5 points in order to indicate improved tolerance to activity.    Time  4    Period  Weeks    Status  On-going      PT LONG TERM GOAL #3   Title  Patient will report centralized symptoms with pain no greater than 1/10 with lumbar AROM for improved function with ADL.    Time  4    Period  Weeks    Status  On-going      PT LONG TERM GOAL #4   Title  Patient will improve lumbar flexion and extension ROM by at least 25% in order to improve ability to return to work.    Time  4    Period  Weeks    Status  On-going            Plan - 01/08/20 0845    Clinical  Impression Statement  Patient appears to be responding well to extension based exercise and manual therapy. She tolerates manual therapy well with reproduction of symptoms. She experiences reduction in symptoms following manual therapy but is sore in periscapular musculature from mobilizations. Soreness decreases with STM. Patient has no symptoms in low back or hip following manual interventions. She completes press ups with proper mechanics with no cueing. Patient requires verbal and tactile cueing for ab set but is able to complete properly following. Patient will continue to benefit from skilled physical therapy in order to reduce impairment and improve function.    Personal Factors and Comorbidities  Past/Current Experience;Comorbidity 1;Time since onset of injury/illness/exacerbation;Profession    Comorbidities  history back pain    Examination-Activity Limitations  Bed Mobility;Bend;Caring for Others;Carry;Lift;Locomotion Level;Squat;Transfers    Examination-Participation Restrictions  Cleaning;Driving;Laundry;Yard Work;Volunteer;Shop;Meal Prep    Rehab Potential  Good    PT Frequency  2x / week    PT Duration  4 weeks    PT Treatment/Interventions  ADLs/Self Care Home Management;Aquatic Therapy;Biofeedback;Cryotherapy;Electrical Stimulation;Iontophoresis 4mg /ml Dexamethasone;Moist Heat;Traction;Ultrasound;Parrafin;Fluidtherapy;DME Instruction;Gait training;Stair training;Functional mobility training;Therapeutic activities;Therapeutic exercise;Balance training;Neuromuscular re-education;Patient/family education;Orthotic Fit/Training;Manual techniques;Manual lymph drainage;Compression bandaging;Scar mobilization;Passive range of motion;Dry needling;Energy conservation;Taping;Splinting;Spinal Manipulations;Joint Manipulations    PT Next Visit Plan  continue manual therapy for pain relief, continue with extension exercises, continue core and postural strengthing    PT Home Exercise Plan  5/4 repeated  extension, press ups 5/6 ab sets, marches    Consulted and Agree with Plan of Care  Patient       Patient will benefit from skilled therapeutic intervention in order to improve the following deficits and impairments:  Abnormal gait, Decreased activity  tolerance, Decreased balance, Decreased mobility, Decreased range of motion, Decreased strength, Hypermobility, Hypomobility, Difficulty walking, Increased muscle spasms, Postural dysfunction, Improper body mechanics, Pain  Visit Diagnosis: Low back pain, unspecified back pain laterality, unspecified chronicity, unspecified whether sciatica present  Muscle weakness (generalized)  Other abnormalities of gait and mobility  Other symptoms and signs involving the musculoskeletal system     Problem List Patient Active Problem List   Diagnosis Date Noted  . Dyspepsia 11/27/2019   8:58 AM, 01/08/20 Wyman Songster PT, DPT Physical Therapist at [facility]   [place] [facility] [address] [place], [place], [address] Phone: [phone]   Fax:  [phone]  Name: Allison Garcia MRN: [id number] Date of Birth: Dec 03, 1990

## 2020-01-08 NOTE — Patient Instructions (Signed)
Access Code: [email or web address] URL: [email or web address] Date: 01/08/2020 Prepared by: Greig Castilla Joleene Burnham  Exercises Abdominal Bracing - 1 x daily - 7 x weekly - 1 sets - 10 reps - 5-10 second hold Supine March - 1 x daily - 7 x weekly - 2 sets - 10 reps

## 2020-01-12 ENCOUNTER — Other Ambulatory Visit: Payer: Self-pay

## 2020-01-12 ENCOUNTER — Ambulatory Visit (HOSPITAL_COMMUNITY): Payer: PRIVATE HEALTH INSURANCE | Admitting: Physical Therapy

## 2020-01-12 ENCOUNTER — Encounter (HOSPITAL_COMMUNITY): Payer: Self-pay | Admitting: Physical Therapy

## 2020-01-12 DIAGNOSIS — R29898 Other symptoms and signs involving the musculoskeletal system: Secondary | ICD-10-CM

## 2020-01-12 DIAGNOSIS — M545 Low back pain, unspecified: Secondary | ICD-10-CM

## 2020-01-12 DIAGNOSIS — R2689 Other abnormalities of gait and mobility: Secondary | ICD-10-CM

## 2020-01-12 DIAGNOSIS — M6281 Muscle weakness (generalized): Secondary | ICD-10-CM

## 2020-01-12 NOTE — Therapy (Signed)
[place], [place], [address] Phone: [phone]   Fax:  [phone]  Physical Therapy Treatment  Patient Details  Name: Allison Garcia MRN: [id number] Date of Birth: [birth year] Referring Provider (PT): Dannial Monarch MD   Encounter Date: 01/12/2020  PT End of Session - 01/12/20 1440    Visit Number  4    Number of Visits  8    Date for PT Re-Evaluation  01/28/20    Authorization Type  Workers Comp    Authorization - Visit Number  4    Authorization - Number of Visits  8    PT Start Time  1436    PT Stop Time  1514    PT Time Calculation (min)  38 min    Activity Tolerance  Patient tolerated treatment well;Patient limited by fatigue    Behavior During Therapy  [facility] for tasks assessed/performed       Past Medical History:  Diagnosis Date  . Abnormal Pap smear of cervix   . Back pain   . History of stomach ulcers    told by ED. no EGD  . Preeclampsia     Past Surgical History:  Procedure Laterality Date  . CESAREAN SECTION    . TEAR DUCT PROBING     as baby    There were no vitals filed for this visit.  Subjective Assessment - 01/12/20 1438    Subjective  Patient says she is doing alright today. Says she feels the exercises have been helping but does not like doing bridge, says it is uncomfortable. Reports no pain in lumbar currently but says she slept funny and her neck is bothering her.    Pertinent History  hx back pain, physical job    Limitations  Lifting;Sitting;Standing;House hold activities    Patient Stated Goals  make her back better    Currently in Pain?  No/denies    Pain Onset  1 to 4 weeks ago                       [facility] Adult PT Treatment/Exercise - 01/12/20 0001      Lumbar Exercises: Standing   Other Standing Lumbar Exercises  hip abduction and extension with RTB x10 each       Lumbar Exercises: Supine   Ab Set  10 reps;5 seconds    Pelvic Tilt  20 reps    Bent  Knee Raise  20 reps    Dead Bug  5 reps    Dead Bug Limitations  increased difficulty, increased RT hip pain       Lumbar Exercises: Prone   Other Prone Lumbar Exercises  prone press ups 2x10      Lumbar Exercises: Quadruped   Opposite Arm/Leg Raise  5 reps      Manual Therapy   Manual Therapy  Soft tissue mobilization;Joint mobilization    Manual therapy comments  completed independly from all other aspects of treatment    Joint Mobilization  R UPA L2-L4 gradeII-III    Soft tissue mobilization  lumbar paraspinals               PT Short Term Goals - 01/06/20 0853      PT SHORT TERM GOAL #1   Title  Patient will be independent with HEP in order to improve functional outcomes.    Time  2    Period  Weeks    Status  On-going    Target Date  01/14/20      PT SHORT TERM GOAL #2   Title  Patient will report at least 25% improvement in symptoms for improved quality of life.    Time  2    Period  Weeks    Status  On-going    Target Date  01/14/20        PT Long Term Goals - 01/06/20 0854      PT LONG TERM GOAL #1   Title  Patient will report at least 75% improvement in symptoms for improved quality of life.    Time  4    Period  Weeks    Status  On-going      PT LONG TERM GOAL #2   Title  Patient will improve FOTO score by at least 5 points in order to indicate improved tolerance to activity.    Time  4    Period  Weeks    Status  On-going      PT LONG TERM GOAL #3   Title  Patient will report centralized symptoms with pain no greater than 1/10 with lumbar AROM for improved function with ADL.    Time  4    Period  Weeks    Status  On-going      PT LONG TERM GOAL #4   Title  Patient will improve lumbar flexion and extension ROM by at least 25% in order to improve ability to return to work.    Time  4    Period  Weeks    Status  On-going            Plan - 01/12/20 1518    Clinical Impression Statement  Patient was well challenged with ther ex  progressions today. Progressed hip and core strengthening exercise. Patient educated on proper form and function of all added activity. Patient limited by fatigue with core progressions to deadbugs today, and noted increased RT lateral hip discomfort. Patient shows bilateral hip drop with added birddogs due to ongoing core weakness. Patient educated on adding ab marching to HEP. Patient noted increased muscle fatigue post treatment. Patient was able to improve lumbar extension Rom with prone press ups post manual treatment. Patient will continue to benefit from skilled therapy service to progress hip and core strength to reduce pain and improve LOF with ADLs.    Personal Factors and Comorbidities  Past/Current Experience;Comorbidity 1;Time since onset of injury/illness/exacerbation;Profession    Comorbidities  history back pain    Examination-Activity Limitations  Bed Mobility;Bend;Caring for Others;Carry;Lift;Locomotion Level;Squat;Transfers    Examination-Participation Restrictions  Cleaning;Driving;Laundry;Yard Work;Volunteer;Shop;Meal Prep    Rehab Potential  Good    PT Frequency  2x / week    PT Duration  4 weeks    PT Treatment/Interventions  ADLs/Self Care Home Management;Aquatic Therapy;Biofeedback;Cryotherapy;Electrical Stimulation;Iontophoresis 4mg /ml Dexamethasone;Moist Heat;Traction;Ultrasound;Parrafin;Fluidtherapy;DME Instruction;Gait training;Stair training;Functional mobility training;Therapeutic activities;Therapeutic exercise;Balance training;Neuromuscular re-education;Patient/family education;Orthotic Fit/Training;Manual techniques;Manual lymph drainage;Compression bandaging;Scar mobilization;Passive range of motion;Dry needling;Energy conservation;Taping;Splinting;Spinal Manipulations;Joint Manipulations    PT Next Visit Plan  continue manual therapy for pain relief, continue with extension exercises, continue core and postural strengthing    PT Home Exercise Plan  5/4 repeated  extension, press ups 5/6 ab sets, marches, 5/10: ab march    Consulted and Agree with Plan of Care  Patient       Patient will benefit from skilled therapeutic intervention in order to improve the following deficits and impairments:  Abnormal gait, Decreased activity tolerance, Decreased balance, Decreased mobility,  Decreased range of motion, Decreased strength, Hypermobility, Hypomobility, Difficulty walking, Increased muscle spasms, Postural dysfunction, Improper body mechanics, Pain  Visit Diagnosis: Low back pain, unspecified back pain laterality, unspecified chronicity, unspecified whether sciatica present  Muscle weakness (generalized)  Other abnormalities of gait and mobility  Other symptoms and signs involving the musculoskeletal system     Problem List Patient Active Problem List   Diagnosis Date Noted  . Dyspepsia 11/27/2019   3:19 PM, 01/12/20 Georges Lynch PT DPT  Physical Therapist with [facility]  [phone]   [facility] [address] [place], [place], [address] Phone: [phone]   Fax:  [phone]  Name: TINY CHAUDHARY MRN: [id number] Date of Birth: 08-Dec-1990

## 2020-01-15 ENCOUNTER — Encounter (HOSPITAL_COMMUNITY): Payer: Self-pay | Admitting: Physical Therapy

## 2020-01-15 ENCOUNTER — Ambulatory Visit (HOSPITAL_COMMUNITY): Payer: PRIVATE HEALTH INSURANCE | Attending: Occupational Medicine | Admitting: Physical Therapy

## 2020-01-15 ENCOUNTER — Other Ambulatory Visit: Payer: Self-pay

## 2020-01-15 DIAGNOSIS — R2689 Other abnormalities of gait and mobility: Secondary | ICD-10-CM | POA: Insufficient documentation

## 2020-01-15 DIAGNOSIS — M6281 Muscle weakness (generalized): Secondary | ICD-10-CM | POA: Diagnosis present

## 2020-01-15 DIAGNOSIS — M545 Low back pain, unspecified: Secondary | ICD-10-CM

## 2020-01-15 DIAGNOSIS — R29898 Other symptoms and signs involving the musculoskeletal system: Secondary | ICD-10-CM | POA: Diagnosis present

## 2020-01-15 NOTE — Therapy (Signed)
[place], [place], [address] Phone: [phone]   Fax:  [phone]  Physical Therapy Treatment  Patient Details  Name: Allison Garcia MRN: [id number] Date of Birth: [birth year] Referring Provider (PT): Dannial Monarch MD   Encounter Date: 01/15/2020  PT End of Session - 01/15/20 0814    Visit Number  5    Number of Visits  8    Date for PT Re-Evaluation  01/28/20    Authorization Type  Workers Comp    Authorization - Visit Number  5    Authorization - Number of Visits  8    PT Start Time  [phone]    PT Stop Time  0856    PT Time Calculation (min)  40 min    Activity Tolerance  Patient tolerated treatment well;Patient limited by fatigue    Behavior During Therapy  [facility] for tasks assessed/performed       Past Medical History:  Diagnosis Date  . Abnormal Pap smear of cervix   . Back pain   . History of stomach ulcers    told by ED. no EGD  . Preeclampsia     Past Surgical History:  Procedure Laterality Date  . CESAREAN SECTION    . TEAR DUCT PROBING     as baby    There were no vitals filed for this visit.  Subjective Assessment - 01/15/20 0820    Subjective  Current pain 2/10 in back.  States she likes the going backwards exercise the most. She is tried and ready to go to bed (just came off of work).    Pertinent History  hx back pain, physical job    Limitations  Lifting;Sitting;Standing;House hold activities    Patient Stated Goals  make her back better    Currently in Pain?  Yes    Pain Location  Back    Pain Orientation  Upper    Pain Type  Acute pain    Pain Onset  1 to 4 weeks ago         [facility] PT Assessment - 01/15/20 0001      Assessment   Medical Diagnosis  Sacrococcygeal Disorder/ LBP    Referring Provider (PT)  Dannial Monarch MD    Onset Date/Surgical Date  12/10/19                    [facility] Adult PT Treatment/Exercise - 01/15/20 0001      Lumbar Exercises: Stretches   Other Lumbar Stretch Exercise  thomas stretch 3x60" L     Other Lumbar Stretch Exercise  standing trunk extension at table x15      Lumbar Exercises: Supine   Bridge  5 reps;3 seconds   4 sets    Other Supine Lumbar Exercises  hip add/abd isometric 4x5 5" holds Each with belt and ball       Lumbar Exercises: Prone   Straight Leg Raise  5 reps;5 seconds   4  sets B   Other Prone Lumbar Exercises  contract relax knee flexion- L LE - pulls on R SIJ- 4x10 5" holds parired with knee flexion in between              PT Education - 01/15/20 0852    Education Details  in pelvis/SIJ anatomy, rationale for exercises. Answered all questions.    Person(s) Educated  Patient    Methods  Explanation;Demonstration;Handout    Comprehension  Verbalized understanding  PT Short Term Goals - 01/06/20 0853      PT SHORT TERM GOAL #1   Title  Patient will be independent with HEP in order to improve functional outcomes.    Time  2    Period  Weeks    Status  On-going    Target Date  01/14/20      PT SHORT TERM GOAL #2   Title  Patient will report at least 25% improvement in symptoms for improved quality of life.    Time  2    Period  Weeks    Status  On-going    Target Date  01/14/20        PT Long Term Goals - 01/06/20 0854      PT LONG TERM GOAL #1   Title  Patient will report at least 75% improvement in symptoms for improved quality of life.    Time  4    Period  Weeks    Status  On-going      PT LONG TERM GOAL #2   Title  Patient will improve FOTO score by at least 5 points in order to indicate improved tolerance to activity.    Time  4    Period  Weeks    Status  On-going      PT LONG TERM GOAL #3   Title  Patient will report centralized symptoms with pain no greater than 1/10 with lumbar AROM for improved function with ADL.    Time  4    Period  Weeks    Status  On-going      PT LONG TERM GOAL #4   Title  Patient will improve lumbar flexion and extension ROM by  at least 25% in order to improve ability to return to work.    Time  4    Period  Weeks    Status  On-going            Plan - 01/15/20 0815    Clinical Impression Statement  More challenging for patient to lift up left leg than right with hip extension in prone.  Performed quad and hip flexor stretch and returned to left leg extension and movement improved afterwards. Less pulling and pain noted with exercises. Able to return to bridges after stretches and isometrics without pain or difficulty. Added new exercises to HEP. Minor tenderness in spot of pain (R PSIS) but no pain reported end of session.    Personal Factors and Comorbidities  Past/Current Experience;Comorbidity 1;Time since onset of injury/illness/exacerbation;Profession    Comorbidities  history back pain    Examination-Activity Limitations  Bed Mobility;Bend;Caring for Others;Carry;Lift;Locomotion Level;Squat;Transfers    Examination-Participation Restrictions  Cleaning;Driving;Laundry;Yard Work;Volunteer;Shop;Meal Prep    Rehab Potential  Good    PT Frequency  2x / week    PT Duration  4 weeks    PT Treatment/Interventions  ADLs/Self Care Home Management;Aquatic Therapy;Biofeedback;Cryotherapy;Electrical Stimulation;Iontophoresis 4mg /ml Dexamethasone;Moist Heat;Traction;Ultrasound;Parrafin;Fluidtherapy;DME Instruction;Gait training;Stair training;Functional mobility training;Therapeutic activities;Therapeutic exercise;Balance training;Neuromuscular re-education;Patient/family education;Orthotic Fit/Training;Manual techniques;Manual lymph drainage;Compression bandaging;Scar mobilization;Passive range of motion;Dry needling;Energy conservation;Taping;Splinting;Spinal Manipulations;Joint Manipulations    PT Next Visit Plan  continue glute activation L hip flexor/quad stretch, manual therapy for pain relief, continue with extension exercises, continue core and postural strengthing    PT Home Exercise Plan  5/4 repeated extension,  press ups 5/6 ab sets, marches, 5/10: ab march; 5/13 thomas stretch L, hip extension, hip abd/add isometrics    Consulted and Agree with Plan of Care  Patient  Patient will benefit from skilled therapeutic intervention in order to improve the following deficits and impairments:  Abnormal gait, Decreased activity tolerance, Decreased balance, Decreased mobility, Decreased range of motion, Decreased strength, Hypermobility, Hypomobility, Difficulty walking, Increased muscle spasms, Postural dysfunction, Improper body mechanics, Pain  Visit Diagnosis: Low back pain, unspecified back pain laterality, unspecified chronicity, unspecified whether sciatica present  Muscle weakness (generalized)  Other abnormalities of gait and mobility  Other symptoms and signs involving the musculoskeletal system     Problem List Patient Active Problem List   Diagnosis Date Noted  . Dyspepsia 11/27/2019   9:02 AM, 01/15/20 Tereasa Coop, DPT Physical Therapy with [facility]  [phone] office  [facility] [address] [place], [place], [address] Phone: [phone]   Fax:  [phone]  Name: MEKIAH WAHLER MRN: [id number] Date of Birth: 20-Sep-1990

## 2020-01-20 ENCOUNTER — Ambulatory Visit (HOSPITAL_COMMUNITY): Payer: PRIVATE HEALTH INSURANCE | Attending: Occupational Medicine | Admitting: Physical Therapy

## 2020-01-20 ENCOUNTER — Encounter (HOSPITAL_COMMUNITY): Payer: Self-pay | Admitting: Physical Therapy

## 2020-01-20 ENCOUNTER — Other Ambulatory Visit: Payer: Self-pay

## 2020-01-20 DIAGNOSIS — M545 Low back pain, unspecified: Secondary | ICD-10-CM

## 2020-01-20 DIAGNOSIS — R29898 Other symptoms and signs involving the musculoskeletal system: Secondary | ICD-10-CM | POA: Diagnosis present

## 2020-01-20 DIAGNOSIS — M6281 Muscle weakness (generalized): Secondary | ICD-10-CM | POA: Diagnosis present

## 2020-01-20 DIAGNOSIS — R2689 Other abnormalities of gait and mobility: Secondary | ICD-10-CM | POA: Insufficient documentation

## 2020-01-20 NOTE — Therapy (Signed)
[place], [place], [address] Phone: [phone]   Fax:  [phone]  Physical Therapy Treatment  Patient Details  Name: DREONNA HUSSEIN MRN: [id number] Date of Birth: 08/19/1991 Referring Provider (PT): Dannial Monarch MD   Encounter Date: 01/20/2020  PT End of Session - 01/20/20 0820    Visit Number  6    Number of Visits  8    Date for PT Re-Evaluation  01/28/20    Authorization Type  Workers Comp    Authorization - Visit Number  6    Authorization - Number of Visits  8    PT Start Time  0815    PT Stop Time  1696    PT Time Calculation (min)  40 min    Activity Tolerance  Patient tolerated treatment well    Behavior During Therapy  [facility] for tasks assessed/performed       Past Medical History:  Diagnosis Date  . Abnormal Pap smear of cervix   . Back pain   . History of stomach ulcers    told by ED. no EGD  . Preeclampsia     Past Surgical History:  Procedure Laterality Date  . CESAREAN SECTION    . TEAR DUCT PROBING     as baby    There were no vitals filed for this visit.  Subjective Assessment - 01/20/20 0815    Subjective  Patient reports her back has been good and she is not having issues. She has had a few times of soreness over the weekend but nothing too much.    Pertinent History  hx back pain, physical job    Limitations  Lifting;Sitting;Standing;House hold activities    Patient Stated Goals  make her back better    Currently in Pain?  No/denies    Pain Onset  1 to 4 weeks ago                        [facility] Adult PT Treatment/Exercise - 01/20/20 0001      Lumbar Exercises: Stretches   Other Lumbar Stretch Exercise  thomas stretch 3x60" L     Other Lumbar Stretch Exercise  standing trunk extension at table x15      Lumbar Exercises: Supine   Bridge  5 reps;3 seconds    Bridge Limitations  4 sets    Other Supine Lumbar Exercises  hip add/abd isometric 4x5 5" holds Each  with belt and ball       Lumbar Exercises: Prone   Straight Leg Raise  10 reps    Straight Leg Raises Limitations  2 sets    Other Prone Lumbar Exercises  prone press up 1x10    Other Prone Lumbar Exercises  prone heel squeezes 10 x 10 second hold      Lumbar Exercises: Quadruped   Other Quadruped Lumbar Exercises  quadruped hip extension with ab set 2x10 bilateral             PT Education - 01/20/20 0819    Education Details  Review of Hep, continuing HEP    Person(s) Educated  Patient    Methods  Explanation;Demonstration    Comprehension  Verbalized understanding;Returned demonstration       PT Short Term Goals - 01/06/20 0853      PT SHORT TERM GOAL #1   Title  Patient will be independent with HEP in order to improve functional outcomes.  Time  2    Period  Weeks    Status  On-going    Target Date  01/14/20      PT SHORT TERM GOAL #2   Title  Patient will report at least 25% improvement in symptoms for improved quality of life.    Time  2    Period  Weeks    Status  On-going    Target Date  01/14/20        PT Long Term Goals - 01/06/20 0854      PT LONG TERM GOAL #1   Title  Patient will report at least 75% improvement in symptoms for improved quality of life.    Time  4    Period  Weeks    Status  On-going      PT LONG TERM GOAL #2   Title  Patient will improve FOTO score by at least 5 points in order to indicate improved tolerance to activity.    Time  4    Period  Weeks    Status  On-going      PT LONG TERM GOAL #3   Title  Patient will report centralized symptoms with pain no greater than 1/10 with lumbar AROM for improved function with ADL.    Time  4    Period  Weeks    Status  On-going      PT LONG TERM GOAL #4   Title  Patient will improve lumbar flexion and extension ROM by at least 25% in order to improve ability to return to work.    Time  4    Period  Weeks    Status  On-going            Plan - 01/20/20 0160    Clinical  Impression Statement  Patient continues to have decrease in symptoms between sessions. She tolerates core and hip strengthening exercises today with minimal increase in symptoms following hip extensions in prone. She requires min verbal cueing for positioning and mechanics of exercises today. She requires verbal cueing and demonstration for quadruped hip extension and requires some cueing to limit trunk rotation secondary to impaired extension strength and ROM. Patient experiences no increase in symptoms at end of session. Patient will continue to benefit from skilled physical therapy in order to improve function and reduce impairment.    Personal Factors and Comorbidities  Past/Current Experience;Comorbidity 1;Time since onset of injury/illness/exacerbation;Profession    Comorbidities  history back pain    Examination-Activity Limitations  Bed Mobility;Bend;Caring for Others;Carry;Lift;Locomotion Level;Squat;Transfers    Examination-Participation Restrictions  Cleaning;Driving;Laundry;Yard Work;Volunteer;Shop;Meal Prep    Rehab Potential  Good    PT Frequency  2x / week    PT Duration  4 weeks    PT Treatment/Interventions  ADLs/Self Care Home Management;Aquatic Therapy;Biofeedback;Cryotherapy;Electrical Stimulation;Iontophoresis 4mg /ml Dexamethasone;Moist Heat;Traction;Ultrasound;Parrafin;Fluidtherapy;DME Instruction;Gait training;Stair training;Functional mobility training;Therapeutic activities;Therapeutic exercise;Balance training;Neuromuscular re-education;Patient/family education;Orthotic Fit/Training;Manual techniques;Manual lymph drainage;Compression bandaging;Scar mobilization;Passive range of motion;Dry needling;Energy conservation;Taping;Splinting;Spinal Manipulations;Joint Manipulations    PT Next Visit Plan  continue glute activation L hip flexor/quad stretch, manual therapy for pain relief, continue with extension exercises, continue core and postural strengthing    PT Home Exercise Plan   5/4 repeated extension, press ups 5/6 ab sets, marches, 5/10: ab march; 5/13 thomas stretch L, hip extension, hip abd/add isometrics    Consulted and Agree with Plan of Care  Patient       Patient will benefit from skilled therapeutic intervention in order to improve the following deficits and  impairments:  Abnormal gait, Decreased activity tolerance, Decreased balance, Decreased mobility, Decreased range of motion, Decreased strength, Hypermobility, Hypomobility, Difficulty walking, Increased muscle spasms, Postural dysfunction, Improper body mechanics, Pain  Visit Diagnosis: Low back pain, unspecified back pain laterality, unspecified chronicity, unspecified whether sciatica present  Muscle weakness (generalized)  Other abnormalities of gait and mobility  Other symptoms and signs involving the musculoskeletal system     Problem List Patient Active Problem List   Diagnosis Date Noted  . Dyspepsia 11/27/2019    8:54 AM, 01/20/20 Wyman Songster PT, DPT Physical Therapist at [facility]  [facility] [address] [place], [place], [address] Phone: [phone]   Fax:  [phone]  Name: KAYLEE TRIVETT MRN: [id number] Date of Birth: [birth year]

## 2020-01-22 ENCOUNTER — Ambulatory Visit (HOSPITAL_COMMUNITY): Payer: PRIVATE HEALTH INSURANCE | Attending: Occupational Medicine | Admitting: Physical Therapy

## 2020-01-22 ENCOUNTER — Encounter (HOSPITAL_COMMUNITY): Payer: Self-pay | Admitting: Physical Therapy

## 2020-01-22 ENCOUNTER — Other Ambulatory Visit: Payer: Self-pay

## 2020-01-22 DIAGNOSIS — M545 Low back pain, unspecified: Secondary | ICD-10-CM

## 2020-01-22 DIAGNOSIS — R2689 Other abnormalities of gait and mobility: Secondary | ICD-10-CM | POA: Diagnosis present

## 2020-01-22 DIAGNOSIS — R29898 Other symptoms and signs involving the musculoskeletal system: Secondary | ICD-10-CM | POA: Diagnosis present

## 2020-01-22 DIAGNOSIS — M6281 Muscle weakness (generalized): Secondary | ICD-10-CM | POA: Diagnosis present

## 2020-01-22 NOTE — Therapy (Signed)
[facility] [address] [place], [place], [address] Phone: [phone]   Fax:  [phone]  Physical Therapy Treatment  Patient Details  Name: Allison Garcia MRN: [id number] Date of Birth: August 29, 1991 Referring Provider (PT): Lucia Gaskins MD   Encounter Date: 01/22/2020  PT End of Session - 01/22/20 6945    Visit Number  7    Number of Visits  8    Date for PT Re-Evaluation  01/28/20    Authorization Type  Workers Comp    Authorization - Visit Number  7    Authorization - Number of Visits  8    PT Start Time  [phone]    PT Stop Time  0857    PT Time Calculation (min)  40 min    Activity Tolerance  Patient tolerated treatment well    Behavior During Therapy  [facility] for tasks assessed/performed       Past Medical History:  Diagnosis Date  . Abnormal Pap smear of cervix   . Back pain   . History of stomach ulcers    told by ED. no EGD  . Preeclampsia     Past Surgical History:  Procedure Laterality Date  . CESAREAN SECTION    . TEAR DUCT PROBING     as baby    There were no vitals filed for this visit.  Subjective Assessment - 01/22/20 0816    Subjective  Patient reports back is doing ok. She is having minimal pain just in her back. Exercises are going well.    Pertinent History  hx back pain, physical job    Limitations  Lifting;Sitting;Standing;House hold activities    Patient Stated Goals  make her back better    Currently in Pain?  Yes    Pain Score  2     Pain Onset  1 to 4 weeks ago                        [facility] Adult PT Treatment/Exercise - 01/22/20 0001      Lumbar Exercises: Stretches   Other Lumbar Stretch Exercise  thomas stretch 3x60" L       Lumbar Exercises: Standing   Functional Squats  10 reps    Functional Squats Limitations  2 sets box lifts with 20#    Other Standing Lumbar Exercises  retro walking with resistance and ab set 2x10      Lumbar Exercises: Supine   Dead Bug  5 reps    Dead Bug Limitations  4 sets, LE only      Lumbar Exercises: Prone   Straight Leg Raise  5 reps;5 seconds    Straight Leg Raises Limitations  3 sets    Other Prone Lumbar Exercises  prone heel squeezes 10 x 10 second hold      Lumbar Exercises: Quadruped   Other Quadruped Lumbar Exercises  quadruped hip extension with ab set 1x10 bilateral             PT Education - 01/22/20 0820    Education Details  Review of Hep, continuing HEP    Person(s) Educated  Patient    Methods  Explanation;Demonstration    Comprehension  Verbalized understanding;Returned demonstration       PT Short Term Goals - 01/06/20 0853      PT SHORT TERM GOAL #1   Title  Patient will be independent with HEP in order to improve functional outcomes.  Time  2    Period  Weeks    Status  On-going    Target Date  01/14/20      PT SHORT TERM GOAL #2   Title  Patient will report at least 25% improvement in symptoms for improved quality of life.    Time  2    Period  Weeks    Status  On-going    Target Date  01/14/20        PT Long Term Goals - 01/06/20 0854      PT LONG TERM GOAL #1   Title  Patient will report at least 75% improvement in symptoms for improved quality of life.    Time  4    Period  Weeks    Status  On-going      PT LONG TERM GOAL #2   Title  Patient will improve FOTO score by at least 5 points in order to indicate improved tolerance to activity.    Time  4    Period  Weeks    Status  On-going      PT LONG TERM GOAL #3   Title  Patient will report centralized symptoms with pain no greater than 1/10 with lumbar AROM for improved function with ADL.    Time  4    Period  Weeks    Status  On-going      PT LONG TERM GOAL #4   Title  Patient will improve lumbar flexion and extension ROM by at least 25% in order to improve ability to return to work.    Time  4    Period  Weeks    Status  On-going            Plan - 01/22/20 6269    Clinical Impression Statement   Patient demonstrating improving hip flexor stiffness on L and tolerates Thomas hip flexor stretch well. Patient tolerates resisted retro walking with ab set without increase in symptoms for improving core strength with increased loads. She requires min/no cueing to complete most exercises following initial demonstration. She demonstrates good lifting mechanics with box lifts and is able to lift from floor to waist without c/o increase in symptoms. She tolerates dying bugs with just LE movement with ab set with slight increase in discomfort. Patient will continue to benefit from skilled physical therapy in order to improve function and reduce impairment.    Personal Factors and Comorbidities  Past/Current Experience;Comorbidity 1;Time since onset of injury/illness/exacerbation;Profession    Comorbidities  history back pain    Examination-Activity Limitations  Bed Mobility;Bend;Caring for Others;Carry;Lift;Locomotion Level;Squat;Transfers    Examination-Participation Restrictions  Cleaning;Driving;Laundry;Yard Work;Volunteer;Shop;Meal Prep    Rehab Potential  Good    PT Frequency  2x / week    PT Duration  4 weeks    PT Treatment/Interventions  ADLs/Self Care Home Management;Aquatic Therapy;Biofeedback;Cryotherapy;Electrical Stimulation;Iontophoresis 4mg /ml Dexamethasone;Moist Heat;Traction;Ultrasound;Parrafin;Fluidtherapy;DME Instruction;Gait training;Stair training;Functional mobility training;Therapeutic activities;Therapeutic exercise;Balance training;Neuromuscular re-education;Patient/family education;Orthotic Fit/Training;Manual techniques;Manual lymph drainage;Compression bandaging;Scar mobilization;Passive range of motion;Dry needling;Energy conservation;Taping;Splinting;Spinal Manipulations;Joint Manipulations    PT Next Visit Plan  continue glute activation L hip flexor/quad stretch, manual therapy for pain relief, continue with extension exercises, continue core and postural strengthing; anticipate  D/c next session    PT Home Exercise Plan  5/4 repeated extension, press ups 5/6 ab sets, marches, 5/10: ab march; 5/13 thomas stretch L, hip extension, hip abd/add isometrics    Consulted and Agree with Plan of Care  Patient       Patient will  benefit from skilled therapeutic intervention in order to improve the following deficits and impairments:  Abnormal gait, Decreased activity tolerance, Decreased balance, Decreased mobility, Decreased range of motion, Decreased strength, Hypermobility, Hypomobility, Difficulty walking, Increased muscle spasms, Postural dysfunction, Improper body mechanics, Pain  Visit Diagnosis: Low back pain, unspecified back pain laterality, unspecified chronicity, unspecified whether sciatica present  Muscle weakness (generalized)  Other abnormalities of gait and mobility  Other symptoms and signs involving the musculoskeletal system     Problem List Patient Active Problem List   Diagnosis Date Noted  . Dyspepsia 11/27/2019    8:58 AM, 01/22/20 Wyman Songster PT, DPT Physical Therapist at [facility]  [facility] [address] [place], [place], [address] Phone: [phone]   Fax:  [phone]  Name: Allison Garcia MRN: [id number] Date of Birth: 1990/11/17

## 2020-01-26 ENCOUNTER — Ambulatory Visit: Payer: No Typology Code available for payment source | Admitting: Family Medicine

## 2020-01-27 ENCOUNTER — Encounter (HOSPITAL_COMMUNITY): Payer: Self-pay | Admitting: Physical Therapy

## 2020-01-27 ENCOUNTER — Other Ambulatory Visit: Payer: Self-pay

## 2020-01-27 ENCOUNTER — Ambulatory Visit (HOSPITAL_COMMUNITY): Payer: PRIVATE HEALTH INSURANCE | Attending: Occupational Medicine | Admitting: Physical Therapy

## 2020-01-27 DIAGNOSIS — R2689 Other abnormalities of gait and mobility: Secondary | ICD-10-CM | POA: Insufficient documentation

## 2020-01-27 DIAGNOSIS — R29898 Other symptoms and signs involving the musculoskeletal system: Secondary | ICD-10-CM | POA: Insufficient documentation

## 2020-01-27 DIAGNOSIS — M545 Low back pain, unspecified: Secondary | ICD-10-CM

## 2020-01-27 DIAGNOSIS — M6281 Muscle weakness (generalized): Secondary | ICD-10-CM | POA: Diagnosis present

## 2020-01-27 NOTE — Therapy (Signed)
[place], [place], [address] Phone: [phone]   Fax:  [phone]  Physical Therapy Treatment/ Discharge Summary  Patient Details  Name: BASILIA STUCKERT MRN: [id number] Date of Birth: May 03, 1991 Referring Provider (PT): Dannial Monarch MD   Encounter Date: 01/27/2020  PHYSICAL THERAPY DISCHARGE SUMMARY  Visits from Start of Care: 8  Current functional level related to goals / functional outcomes: See below   Remaining deficits: See below   Education / Equipment: See below  Plan: Patient agrees to discharge.  Patient goals were met. Patient is being discharged due to meeting the stated rehab goals.  ?????       PT End of Session - 01/27/20 0823    Visit Number  8    Number of Visits  8    Date for PT Re-Evaluation  01/28/20    Authorization Type  Workers Comp    Authorization - Visit Number  8    Authorization - Number of Visits  8    PT Start Time  0815    PT Stop Time  0840    PT Time Calculation (min)  25 min    Activity Tolerance  Patient tolerated treatment well    Behavior During Therapy  WFL for tasks assessed/performed       Past Medical History:  Diagnosis Date  . Abnormal Pap smear of cervix   . Back pain   . History of stomach ulcers    told by ED. no EGD  . Preeclampsia     Past Surgical History:  Procedure Laterality Date  . CESAREAN SECTION    . TEAR DUCT PROBING     as baby    There were no vitals filed for this visit.  Subjective Assessment - 01/27/20 0816    Subjective  Patient states her back is ok. It's a little sore this morning because she did a lot of walking last night. She states this is similar to her prior back pain before injury. Patient states 95% improvement since beginning physical therapy. She is limited by intermittent increase in symptoms. All of her exercises are going well at home.    Pertinent History  hx back pain, physical job    Limitations   Lifting;Sitting;Standing;House hold activities    Patient Stated Goals  make her back better    Currently in Pain?  Yes    Pain Score  4     Pain Location  Back    Pain Onset  1 to 4 weeks ago         [facility] PT Assessment - 01/27/20 0001      Assessment   Medical Diagnosis  Sacrococcygeal Disorder/ LBP    Referring Provider (PT)  Dannial Monarch MD    Onset Date/Surgical Date  12/10/19    Next MD Visit  none    Prior Therapy  Yes      Precautions   Precautions  None      Restrictions   Weight Bearing Restrictions  No      Balance Screen   Has the patient fallen in the past 6 months  No    Has the patient had a decrease in activity level because of a fear of falling?   No    Is the patient reluctant to leave their home because of a fear of falling?   No      Prior Function   Level of Independence  Independent  Vocation  Workers comp    Pensions consultant    Leisure  hang out with kids, play      Cognition   Overall Cognitive Status  Within Functional Limits for tasks assessed      Observation/Other Assessments   Observations  Ambulates without AD    Focus on Therapeutic Outcomes (FOTO)   6% limited      Sensation   Light Touch  Appears Intact      Posture/Postural Control   Posture/Postural Control  Postural limitations    Postural Limitations  Increased thoracic kyphosis;Increased lumbar lordosis      AROM   Lumbar Flexion  0% limited    Lumbar Extension  0% limited    Lumbar - Right Side Bend  0% limited    Lumbar - Left Side Bend  0% limited    Lumbar - Right Rotation  0% limited    Lumbar - Left Rotation  0% limited      Strength   Right Hip Flexion  5/5    Right Hip Extension  5/5    Right Hip ABduction  5/5    Left Hip Flexion  5/5    Left Hip Extension  5/5    Left Hip ABduction  5/5    Right Knee Flexion  5/5    Right Knee Extension  5/5    Left Knee Flexion  5/5    Left Knee Extension  5/5    Right Ankle Dorsiflexion  5/5    Left Ankle  Dorsiflexion  5/5                            PT Education - 01/27/20 0822    Education Details  Review of Hep, continuing HEP, progress made, returning to physical therapy if needed    Person(s) Educated  Patient    Methods  Explanation;Demonstration    Comprehension  Verbalized understanding;Returned demonstration       PT Short Term Goals - 01/27/20 0321      PT SHORT TERM GOAL #1   Title  Patient will be independent with HEP in order to improve functional outcomes.    Time  2    Period  Weeks    Status  Achieved    Target Date  01/14/20      PT SHORT TERM GOAL #2   Title  Patient will report at least 25% improvement in symptoms for improved quality of life.    Time  2    Period  Weeks    Status  Achieved    Target Date  01/14/20        PT Long Term Goals - 01/27/20 2248      PT LONG TERM GOAL #1   Title  Patient will report at least 75% improvement in symptoms for improved quality of life.    Time  4    Period  Weeks    Status  Achieved      PT LONG TERM GOAL #2   Title  Patient will improve FOTO score by at least 5 points in order to indicate improved tolerance to activity.    Time  4    Period  Weeks    Status  Achieved      PT LONG TERM GOAL #3   Title  Patient will report centralized symptoms with pain no greater than 1/10 with lumbar AROM for improved function with ADL.  Time  4    Period  Weeks    Status  Achieved      PT LONG TERM GOAL #4   Title  Patient will improve lumbar flexion and extension ROM by at least 25% in order to improve ability to return to work.    Time  4    Period  Weeks    Status  Achieved            Plan - 01/27/20 9977    Clinical Impression Statement  Patient has met all short and long term goals with ability to complete HEP, improved symptoms, improved activity tolerance, pain free AROM, and ROM. Patient demonstrates improving ROM, strength, and functional mobility today. She continues to be  limited by chronic back pain after long shifts at work occasionally and is educated on continuing HEP to improve core and LE strength to improve symptoms further. She is also educated on returning to physical therapy should any further issues arise. We reviewed HEP today and updated objective measures and she demonstrates vast improvements today. Patient discharged from therapy at this time.    Personal Factors and Comorbidities  Past/Current Experience;Comorbidity 1;Time since onset of injury/illness/exacerbation;Profession    Comorbidities  history back pain    Examination-Activity Limitations  Bed Mobility;Bend;Caring for Others;Carry;Lift;Locomotion Level;Squat;Transfers    Examination-Participation Restrictions  Cleaning;Driving;Laundry;Yard Work;Volunteer;Shop;Meal Prep    Rehab Potential  --    PT Frequency  --    PT Duration  --    PT Treatment/Interventions  ADLs/Self Care Home Management;Aquatic Therapy;Biofeedback;Cryotherapy;Electrical Stimulation;Iontophoresis 42m/ml Dexamethasone;Moist Heat;Traction;Ultrasound;Parrafin;Fluidtherapy;DME Instruction;Gait training;Stair training;Functional mobility training;Therapeutic activities;Therapeutic exercise;Balance training;Neuromuscular re-education;Patient/family education;Orthotic Fit/Training;Manual techniques;Manual lymph drainage;Compression bandaging;Scar mobilization;Passive range of motion;Dry needling;Energy conservation;Taping;Splinting;Spinal Manipulations;Joint Manipulations    PT Next Visit Plan  n/a    PT Home Exercise Plan  5/4 repeated extension, press ups 5/6 ab sets, marches, 5/10: ab march; 5/13 thomas stretch L, hip extension, hip abd/add isometrics    Consulted and Agree with Plan of Care  Patient       Patient will benefit from skilled therapeutic intervention in order to improve the following deficits and impairments:  Abnormal gait, Decreased activity tolerance, Decreased balance, Decreased mobility, Decreased range of  motion, Decreased strength, Hypermobility, Hypomobility, Difficulty walking, Increased muscle spasms, Postural dysfunction, Improper body mechanics, Pain  Visit Diagnosis: Low back pain, unspecified back pain laterality, unspecified chronicity, unspecified whether sciatica present  Muscle weakness (generalized)  Other abnormalities of gait and mobility  Other symptoms and signs involving the musculoskeletal system     Problem List Patient Active Problem List   Diagnosis Date Noted  . Dyspepsia 11/27/2019    8:43 AM, 01/27/20 AMearl LatinPT, DPT [place] [address]Phone: [phone]  Fax:  [phone] Name: JLEOLIA VINZANTMRN: [id number] of Birth: 608/07/1991

## 2020-01-28 NOTE — Patient Instructions (Signed)
Allison Garcia  01/28/2020     @PREFPERIOPPHARMACY @   Your procedure is scheduled on  02/05/2020 .  Report to [facility] at  1330 (1:30)  P.M.  Call this number if you have problems the morning of surgery:  [phone]   Remember:  Follow the diet instructions given to you by Dr [phone] office.                   Take these medicines the morning of surgery with A SIP OF WATER Protonix.    Do not wear jewelry, make-up or nail polish.  Do not wear lotions, powders, or perfumes. Please wear deodorant and brush your teeth.  Do not shave 48 hours prior to surgery.  Men may shave face and neck.  Do not bring valuables to the hospital.  [facility] is not responsible for any belongings or valuables.  Contacts, dentures or bridgework may not be worn into surgery.  Leave your suitcase in the car.  After surgery it may be brought to your room.  For patients admitted to the hospital, discharge time will be determined by your treatment team.  Patients discharged the day of surgery will not be allowed to drive home.   Name and phone number of your driver:   family Special instructions:  DO NOT smoke the morning of your procedure.  Please read over the following fact sheets that you were given. Anesthesia Post-op Instructions and Care and Recovery After Surgery       Upper Endoscopy, Adult, Care After This sheet gives you information about how to care for yourself after your procedure. Your health care provider may also give you more specific instructions. If you have problems or questions, contact your health care provider. What can I expect after the procedure? After the procedure, it is common to have:  A sore throat.  Mild stomach pain or discomfort.  Bloating.  Nausea. Follow these instructions at home:   Follow instructions from your health care provider about what to eat or drink after your procedure.  Return to your normal activities as told by your health  care provider. Ask your health care provider what activities are safe for you.  Take over-the-counter and prescription medicines only as told by your health care provider.  Do not drive for 24 hours if you were given a sedative during your procedure.  Keep all follow-up visits as told by your health care provider. This is important. Contact a health care provider if you have:  A sore throat that lasts longer than one day.  Trouble swallowing. Get help right away if:  You vomit blood or your vomit looks like coffee grounds.  You have: ? A fever. ? Bloody, black, or tarry stools. ? A severe sore throat or you cannot swallow. ? Difficulty breathing. ? Severe pain in your chest or abdomen. Summary  After the procedure, it is common to have a sore throat, mild stomach discomfort, bloating, and nausea.  Do not drive for 24 hours if you were given a sedative during the procedure.  Follow instructions from your health care provider about what to eat or drink after your procedure.  Return to your normal activities as told by your health care provider. This information is not intended to replace advice given to you by your health care provider. Make sure you discuss any questions you have with your health care provider. Document Revised: 02/12/2018 Document Reviewed: 01/21/2018 Elsevier Patient  Education  [place] After These instructions provide you with information about caring for yourself after your procedure. Your health care provider may also give you more specific instructions. Your treatment has been planned according to current medical practices, but problems sometimes occur. Call your health care provider if you have any problems or questions after your procedure. What can I expect after the procedure? After your procedure, you may:  Feel sleepy for several hours.  Feel clumsy and have poor balance for several hours.  Feel  forgetful about what happened after the procedure.  Have poor judgment for several hours.  Feel nauseous or vomit.  Have a sore throat if you had a breathing tube during the procedure. Follow these instructions at home: For at least 24 hours after the procedure:      Have a responsible adult stay with you. It is important to have someone help care for you until you are awake and alert.  Rest as needed.  Do not: ? Participate in activities in which you could fall or become injured. ? Drive. ? Use heavy machinery. ? Drink alcohol. ? Take sleeping pills or medicines that cause drowsiness. ? Make important decisions or sign legal documents. ? Take care of children on your own. Eating and drinking  Follow the diet that is recommended by your health care provider.  If you vomit, drink water, juice, or soup when you can drink without vomiting.  Make sure you have little or no nausea before eating solid foods. General instructions  Take over-the-counter and prescription medicines only as told by your health care provider.  If you have sleep apnea, surgery and certain medicines can increase your risk for breathing problems. Follow instructions from your health care provider about wearing your sleep device: ? Anytime you are sleeping, including during daytime naps. ? While taking prescription pain medicines, sleeping medicines, or medicines that make you drowsy.  If you smoke, do not smoke without supervision.  Keep all follow-up visits as told by your health care provider. This is important. Contact a health care provider if:  You keep feeling nauseous or you keep vomiting.  You feel light-headed.  You develop a rash.  You have a fever. Get help right away if:  You have trouble breathing. Summary  For several hours after your procedure, you may feel sleepy and have poor judgment.  Have a responsible adult stay with you for at least 24 hours or until you are awake and  alert. This information is not intended to replace advice given to you by your health care provider. Make sure you discuss any questions you have with your health care provider. Document Revised: 11/19/2017 Document Reviewed: 12/12/2015 Elsevier Patient Education  [place].

## 2020-01-28 NOTE — Progress Notes (Signed)
Called patient and left a message with her voice mail. I ask her to please, come at 2:45 or a little before because of a scheduling conflict 02/03/2020.  Call patient later this afternoon and and left another message, "Please call me back."  I called patient on 01/29/2020, and the recording said to try again later. (Noted 01/30/2020.) Called and left a message asking patient to come in at 2:45 and tell registration to please send her to Covid Testing first. Also told patient if she had any questions to call me. Nothing further needed. (01/30/2020)

## 2020-01-29 ENCOUNTER — Encounter (HOSPITAL_COMMUNITY): Payer: No Typology Code available for payment source | Admitting: Physical Therapy

## 2020-01-30 NOTE — Progress Notes (Signed)
Called patient and left a message with her voice mail. I ask her to please, come at 2:45 or a little before because of a scheduling conflict 02/03/2020. (File Time 5/26 1:43)  This is a continuation of the first note created 01/28/2020. Waiting for patient to call me back.  I tried to edit the first and couldn't. (copied and pasted note into one note.01/30/2019.)  Tried to get in touch with patient later that afternoon left another message with voicemail, asking patient to please call me back.  I tried patient again 01/29/2020 I couldn't leave a message. I'm hoping she will come in at 2:45 and come to Covid testing first.

## 2020-02-03 ENCOUNTER — Other Ambulatory Visit (HOSPITAL_COMMUNITY)
Admission: RE | Admit: 2020-02-03 | Discharge: 2020-02-03 | Disposition: A | Discharge status: Home / Self Care | Payer: No Typology Code available for payment source | Source: Ambulatory Visit | Attending: Internal Medicine | Admitting: Internal Medicine

## 2020-02-03 ENCOUNTER — Encounter (HOSPITAL_COMMUNITY)
Admission: RE | Admit: 2020-02-03 | Discharge: 2020-02-03 | Disposition: A | Discharge status: Home / Self Care | Payer: No Typology Code available for payment source | Source: Ambulatory Visit | Attending: Internal Medicine | Admitting: Internal Medicine

## 2020-02-03 ENCOUNTER — Encounter (HOSPITAL_COMMUNITY): Payer: Self-pay

## 2020-02-03 ENCOUNTER — Other Ambulatory Visit: Payer: Self-pay

## 2020-02-03 ENCOUNTER — Other Ambulatory Visit (HOSPITAL_COMMUNITY): Payer: No Typology Code available for payment source

## 2020-02-03 DIAGNOSIS — Z01818 Encounter for other preprocedural examination: Secondary | ICD-10-CM | POA: Diagnosis not present

## 2020-02-03 DIAGNOSIS — Z20822 Contact with and (suspected) exposure to covid-19: Secondary | ICD-10-CM | POA: Insufficient documentation

## 2020-02-03 LAB — SARS CORONAVIRUS 2 (TAT 6-24 HRS): SARS Coronavirus 2: NEGATIVE

## 2020-02-03 LAB — PREGNANCY, URINE: Preg Test, Ur: NEGATIVE

## 2020-02-04 ENCOUNTER — Telehealth: Payer: Self-pay | Admitting: *Deleted

## 2020-02-04 NOTE — Telephone Encounter (Signed)
Patient called back and stated she did not want to move procedure time up. Endo aware

## 2020-02-04 NOTE — Telephone Encounter (Signed)
LMOVM for pt. Endo called and wants to see if procedure time can be moved up to 11:15am.

## 2020-02-05 ENCOUNTER — Ambulatory Visit (HOSPITAL_COMMUNITY): Payer: No Typology Code available for payment source | Admitting: Anesthesiology

## 2020-02-05 ENCOUNTER — Encounter: Payer: Self-pay | Admitting: Internal Medicine

## 2020-02-05 ENCOUNTER — Ambulatory Visit (HOSPITAL_COMMUNITY)
Admission: RE | Admit: 2020-02-05 | Discharge: 2020-02-05 | Disposition: A | Discharge status: Home / Self Care | Payer: No Typology Code available for payment source | Source: Ambulatory Visit | Attending: Internal Medicine | Admitting: Internal Medicine

## 2020-02-05 ENCOUNTER — Other Ambulatory Visit: Payer: Self-pay

## 2020-02-05 ENCOUNTER — Encounter (HOSPITAL_COMMUNITY): Payer: Self-pay | Admitting: Internal Medicine

## 2020-02-05 ENCOUNTER — Encounter (HOSPITAL_COMMUNITY)
Admission: RE | Disposition: A | Discharge status: Home / Self Care | Payer: Self-pay | Source: Ambulatory Visit | Attending: Internal Medicine

## 2020-02-05 DIAGNOSIS — Z8759 Personal history of other complications of pregnancy, childbirth and the puerperium: Secondary | ICD-10-CM | POA: Diagnosis not present

## 2020-02-05 DIAGNOSIS — Z8711 Personal history of peptic ulcer disease: Secondary | ICD-10-CM | POA: Insufficient documentation

## 2020-02-05 DIAGNOSIS — M549 Dorsalgia, unspecified: Secondary | ICD-10-CM | POA: Diagnosis not present

## 2020-02-05 DIAGNOSIS — K2100 Gastro-esophageal reflux disease with esophagitis, without bleeding: Secondary | ICD-10-CM | POA: Insufficient documentation

## 2020-02-05 DIAGNOSIS — Z793 Long term (current) use of hormonal contraceptives: Secondary | ICD-10-CM | POA: Insufficient documentation

## 2020-02-05 DIAGNOSIS — Z8379 Family history of other diseases of the digestive system: Secondary | ICD-10-CM | POA: Insufficient documentation

## 2020-02-05 DIAGNOSIS — Z7952 Long term (current) use of systemic steroids: Secondary | ICD-10-CM | POA: Diagnosis not present

## 2020-02-05 DIAGNOSIS — R1013 Epigastric pain: Secondary | ICD-10-CM | POA: Insufficient documentation

## 2020-02-05 DIAGNOSIS — F17210 Nicotine dependence, cigarettes, uncomplicated: Secondary | ICD-10-CM | POA: Insufficient documentation

## 2020-02-05 DIAGNOSIS — Z825 Family history of asthma and other chronic lower respiratory diseases: Secondary | ICD-10-CM | POA: Insufficient documentation

## 2020-02-05 DIAGNOSIS — K2210 Ulcer of esophagus without bleeding: Secondary | ICD-10-CM | POA: Diagnosis not present

## 2020-02-05 DIAGNOSIS — K209 Esophagitis, unspecified without bleeding: Secondary | ICD-10-CM

## 2020-02-05 DIAGNOSIS — Z79899 Other long term (current) drug therapy: Secondary | ICD-10-CM | POA: Diagnosis not present

## 2020-02-05 HISTORY — PX: ESOPHAGOGASTRODUODENOSCOPY (EGD) WITH PROPOFOL: SHX5813

## 2020-02-05 SURGERY — ESOPHAGOGASTRODUODENOSCOPY (EGD) WITH PROPOFOL
Anesthesia: General

## 2020-02-05 MED ORDER — LIDOCAINE 2% (20 MG/ML) 5 ML SYRINGE
INTRAMUSCULAR | Status: AC
  Filled 2020-02-05: qty 25

## 2020-02-05 MED ORDER — PROPOFOL 500 MG/50ML IV EMUL
INTRAVENOUS | Status: DC | PRN
  Administered 2020-02-05: 150 ug/kg/min via INTRAVENOUS

## 2020-02-05 MED ORDER — LACTATED RINGERS IV SOLN
INTRAVENOUS | Status: DC | PRN
Start: 2020-02-05 — End: 2020-02-05

## 2020-02-05 MED ORDER — CHLORHEXIDINE GLUCONATE CLOTH 2 % EX PADS: 6.0000 | MEDICATED_PAD | Freq: Once | CUTANEOUS | Status: DC

## 2020-02-05 MED ORDER — GLYCOPYRROLATE 0.2 MG/ML IJ SOLN
0.2000 mg | Freq: Once | INTRAMUSCULAR | Status: AC
  Administered 2020-02-05: 0.2 mg via INTRAVENOUS
  Filled 2020-02-05: qty 1

## 2020-02-05 MED ORDER — PROPOFOL 10 MG/ML IV BOLUS
INTRAVENOUS | Status: DC | PRN
  Administered 2020-02-05: 100 mg via INTRAVENOUS

## 2020-02-05 MED ORDER — LIDOCAINE VISCOUS HCL 2 % MT SOLN
15.0000 mL | Freq: Once | OROMUCOSAL | Status: AC
  Administered 2020-02-05: 15 mL via OROMUCOSAL

## 2020-02-05 MED ORDER — LIDOCAINE VISCOUS HCL 2 % MT SOLN
OROMUCOSAL | Status: AC
  Filled 2020-02-05: qty 15

## 2020-02-05 MED ORDER — PROPOFOL 10 MG/ML IV BOLUS
INTRAVENOUS | Status: AC
  Filled 2020-02-05: qty 140

## 2020-02-05 MED ORDER — LIDOCAINE 2% (20 MG/ML) 5 ML SYRINGE
INTRAMUSCULAR | Status: AC
  Filled 2020-02-05: qty 5

## 2020-02-05 MED ORDER — LIDOCAINE HCL 1 % IJ SOLN
INTRAMUSCULAR | Status: DC | PRN
  Administered 2020-02-05: 60 mg via INTRADERMAL

## 2020-02-05 MED ORDER — LACTATED RINGERS IV SOLN: Freq: Once | INTRAVENOUS | Status: AC

## 2020-02-05 MED ORDER — PROPOFOL 10 MG/ML IV BOLUS
INTRAVENOUS | Status: AC
  Filled 2020-02-05: qty 20

## 2020-02-05 NOTE — Discharge Instructions (Signed)
EGD Discharge instructions Please read the instructions outlined below and refer to this sheet in the next few weeks. These discharge instructions provide you with general information on caring for yourself after you leave the hospital. Your doctor may also give you specific instructions. While your treatment has been planned according to the most current medical practices available, unavoidable complications occasionally occur. If you have any problems or questions after discharge, please call your doctor. ACTIVITY  You may resume your regular activity but move at a slower pace for the next 24 hours.   Take frequent rest periods for the next 24 hours.   Walking will help expel (get rid of) the air and reduce the bloated feeling in your abdomen.   No driving for 24 hours (because of the anesthesia (medicine) used during the test).   You may shower.   Do not sign any important legal documents or operate any machinery for 24 hours (because of the anesthesia used during the test).  NUTRITION  Drink plenty of fluids.   You may resume your normal diet.   Begin with a light meal and progress to your normal diet.   Avoid alcoholic beverages for 24 hours or as instructed by your caregiver.  MEDICATIONS  You may resume your normal medications unless your caregiver tells you otherwise.  WHAT YOU CAN EXPECT TODAY  You may experience abdominal discomfort such as a feeling of fullness or "gas" pains.  FOLLOW-UP  Your doctor will discuss the results of your test with you.  SEEK IMMEDIATE MEDICAL ATTENTION IF ANY OF THE FOLLOWING OCCUR:  Excessive nausea (feeling sick to your stomach) and/or vomiting.   Severe abdominal pain and distention (swelling).   Trouble swallowing.   Temperature over 101 F (37.8 C).   Rectal bleeding or vomiting of blood.      GERD information provided  You have acid burns in your esophagus.  No ulcer.  Important to take your medication as  directed  Increasing Protonix to 40 mg twice daily ( before breakfast and supper).  I recommend you set the alarm on your smart phone for your medication times.  This way you will be able to easily remember taking it.  Recommend you take it twice daily whether you feel you needed or not.  Office visit with [place] in 6 weeks  At patient request, I called Linde Gillis at [phone] and reviewed results.  PATIENT INSTRUCTIONS POST-ANESTHESIA  IMMEDIATELY FOLLOWING SURGERY:  Do not drive or operate machinery for the first twenty four hours after surgery.  Do not make any important decisions for twenty four hours after surgery or while taking narcotic pain medications or sedatives.  If you develop intractable nausea and vomiting or a severe headache please notify your doctor immediately.  FOLLOW-UP:  Please make an appointment with your surgeon as instructed. You do not need to follow up with anesthesia unless specifically instructed to do so.  WOUND CARE INSTRUCTIONS (if applicable):  Keep a dry clean dressing on the anesthesia/puncture wound site if there is drainage.  Once the wound has quit draining you may leave it open to air.  Generally you should leave the bandage intact for twenty four hours unless there is drainage.  If the epidural site drains for more than 36-48 hours please call the anesthesia department.  QUESTIONS?:  Please feel free to call your physician or the hospital operator if you have any questions, and they will be happy to assist you.  Gastroesophageal Reflux Disease, Adult Gastroesophageal reflux (GER) happens when acid from the stomach flows up into the tube that connects the mouth and the stomach (esophagus). Normally, food travels down the esophagus and stays in the stomach to be digested. With GER, food and stomach acid sometimes move back up into the esophagus. You may have a disease called gastroesophageal reflux disease (GERD) if the reflux:  Happens  often.  Causes frequent or very bad symptoms.  Causes problems such as damage to the esophagus. When this happens, the esophagus becomes sore and swollen (inflamed). Over time, GERD can make small holes (ulcers) in the lining of the esophagus. What are the causes? This condition is caused by a problem with the muscle between the esophagus and the stomach. When this muscle is weak or not normal, it does not close properly to keep food and acid from coming back up from the stomach. The muscle can be weak because of:  Tobacco use.  Pregnancy.  Having a certain type of hernia (hiatal hernia).  Alcohol use.  Certain foods and drinks, such as coffee, chocolate, onions, and peppermint. What increases the risk? You are more likely to develop this condition if you:  Are overweight.  Have a disease that affects your connective tissue.  Use NSAID medicines. What are the signs or symptoms? Symptoms of this condition include:  Heartburn.  Difficult or painful swallowing.  The feeling of having a lump in the throat.  A bitter taste in the mouth.  Bad breath.  Having a lot of saliva.  Having an upset or bloated stomach.  Belching.  Chest pain. Different conditions can cause chest pain. Make sure you see your doctor if you have chest pain.  Shortness of breath or noisy breathing (wheezing).  Ongoing (chronic) cough or a cough at night.  Wearing away of the surface of teeth (tooth enamel).  Weight loss. How is this treated? Treatment will depend on how bad your symptoms are. Your doctor may suggest:  Changes to your diet.  Medicine.  Surgery. Follow these instructions at home: Eating and drinking   Follow a diet as told by your doctor. You may need to avoid foods and drinks such as: ? Coffee and tea (with or without caffeine). ? Drinks that contain alcohol. ? Energy drinks and sports drinks. ? Bubbly (carbonated) drinks or sodas. ? Chocolate and  cocoa. ? Peppermint and mint flavorings. ? Garlic and onions. ? Horseradish. ? Spicy and acidic foods. These include peppers, chili powder, curry powder, vinegar, hot sauces, and BBQ sauce. ? Citrus fruit juices and citrus fruits, such as oranges, lemons, and limes. ? Tomato-based foods. These include red sauce, chili, salsa, and pizza with red sauce. ? Fried and fatty foods. These include donuts, french fries, potato chips, and high-fat dressings. ? High-fat meats. These include hot dogs, rib eye steak, sausage, ham, and bacon. ? High-fat dairy items, such as whole milk, butter, and cream cheese.  Eat small meals often. Avoid eating large meals.  Avoid drinking large amounts of liquid with your meals.  Avoid eating meals during the 2-3 hours before bedtime.  Avoid lying down right after you eat.  Do not exercise right after you eat. Lifestyle   Do not use any products that contain nicotine or tobacco. These include cigarettes, e-cigarettes, and chewing tobacco. If you need help quitting, ask your doctor.  Try to lower your stress. If you need help doing this, ask your doctor.  If you are overweight, lose an  amount of weight that is healthy for you. Ask your doctor about a safe weight loss goal. General instructions  Pay attention to any changes in your symptoms.  Take over-the-counter and prescription medicines only as told by your doctor. Do not take aspirin, ibuprofen, or other NSAIDs unless your doctor says it is okay.  Wear loose clothes. Do not wear anything tight around your waist.  Raise (elevate) the head of your bed about 6 inches (15 cm).  Avoid bending over if this makes your symptoms worse.  Keep all follow-up visits as told by your doctor. This is important. Contact a doctor if:  You have new symptoms.  You lose weight and you do not know why.  You have trouble swallowing or it hurts to swallow.  You have wheezing or a cough that keeps happening.  Your  symptoms do not get better with treatment.  You have a hoarse voice. Get help right away if:  You have pain in your arms, neck, jaw, teeth, or back.  You feel sweaty, dizzy, or light-headed.  You have chest pain or shortness of breath.  You throw up (vomit) and your throw-up looks like blood or coffee grounds.  You pass out (faint).  Your poop (stool) is bloody or black.  You cannot swallow, drink, or eat. Summary  If a person has gastroesophageal reflux disease (GERD), food and stomach acid move back up into the esophagus and cause symptoms or problems such as damage to the esophagus.  Treatment will depend on how bad your symptoms are.  Follow a diet as told by your doctor.  Take all medicines only as told by your doctor. This information is not intended to replace advice given to you by your health care provider. Make sure you discuss any questions you have with your health care provider. Document Revised: 02/27/2018 Document Reviewed: 02/27/2018 Elsevier Patient Education  2020 [employer].

## 2020-02-05 NOTE — H&P (Signed)
@LOGO @   Primary Care Physician:  Patient, No Pcp Per Primary Gastroenterologist:  Dr. Gala Romney  Pre-Procedure History & Physical: HPI:  Allison Garcia is a 29 y.o. female here for intermittent epigastric pain reflux poorly controlled (patient often forgets to take her PPI.)  No dysphagia.  Gallbladder ultrasound negative.  Past Medical History:  Diagnosis Date  . Abnormal Pap smear of cervix   . Back pain   . History of stomach ulcers    told by ED. no EGD  . Preeclampsia     Past Surgical History:  Procedure Laterality Date  . CESAREAN SECTION    . TEAR DUCT PROBING     as baby    Prior to Admission medications   Medication Sig Start Date End Date Taking? Authorizing Provider  acetaminophen (TYLENOL) 500 MG tablet Take 1,000 mg by mouth every 6 (six) hours as needed for moderate pain.   Yes [provider]  albuterol (VENTOLIN HFA) 108 (90 Base) MCG/ACT inhaler Inhale 1-2 puffs into the lungs every 6 (six) hours as needed for wheezing or shortness of breath.   Yes [provider]  levonorgestrel (KYLEENA) 19.5 MG IUD 19.5 mg by Intrauterine route once.   Yes [provider]  pantoprazole (PROTONIX) 40 MG tablet Take 1 tablet (40 mg total) by mouth daily. Patient taking differently: Take 40 mg by mouth daily as needed (heartburn).  11/17/19  Yes Petrucelli, Samantha R, PA-C  sucralfate (CARAFATE) 1 GM/10ML suspension Take 10 mLs (1 g total) by mouth 4 (four) times daily -  with meals and at bedtime. Patient taking differently: Take 1 g by mouth daily as needed (stomach pain).  11/17/19  Yes Petrucelli, Samantha R, PA-C  ondansetron (ZOFRAN ODT) 4 MG disintegrating tablet Take 1 tablet (4 mg total) by mouth every 8 (eight) hours as needed for nausea or vomiting. Patient not taking: Reported on 01/20/2020 11/17/19   Petrucelli, Glynda Jaeger, PA-C  predniSONE (STERAPRED UNI-PAK 21 TAB) 10 MG (21) TBPK tablet Take by mouth daily. Take 6 tabs by mouth daily  for 2  days, then 5 tabs for 2 days, then 4 tabs for 2 days, then 3 tabs for 2 days, 2 tabs for 2 days, then 1 tab by mouth daily for 2 days Patient not taking: Reported on 01/20/2020 12/10/19   Couture, Cortni S, PA-C  levonorgestrel-ethinyl estradiol (SEASONALE,INTROVALE,JOLESSA) 0.15-0.03 MG tablet Take 1 tablet by mouth at bedtime.  08/05/19  [provider]    Allergies as of 11/27/2019 - Review Complete 11/27/2019  Allergen Reaction Noted  . Other Anaphylaxis and Shortness Of Breath 03/11/2019    Family History  Problem Relation Age of Onset  . COPD Other   . Gallbladder disease Mother   . Colon cancer Neg Hx   . Colon polyps Neg Hx     Social History   Socioeconomic History  . Marital status: Single    Spouse name: Not on file  . Number of children: Not on file  . Years of education: Not on file  . Highest education level: Not on file  Occupational History  . Occupation: Nurse Tech    Comment: [employer]  Tobacco Use  . Smoking status: Current Every Day Smoker    Packs/day: 1.00    Years: 9.00    Pack years: 9.00    Types: Cigarettes  . Smokeless tobacco: Never Used  Substance and Sexual Activity  . Alcohol use: Yes    Comment: occas  .  Drug use: No  . Sexual activity: Yes    Birth control/protection: Pill  Other Topics Concern  . Not on file  Social History Narrative  . Not on file   Social Determinants of Health   Financial Resource Strain:   . Difficulty of Paying Living Expenses:   Food Insecurity:   . Worried About Programme researcher, broadcasting/film/video in the Last Year:   . Barista in the Last Year:   Transportation Needs:   . Freight forwarder (Medical):   Marland Kitchen Lack of Transportation (Non-Medical):   Physical Activity:   . Days of Exercise per Week:   . Minutes of Exercise per Session:   Stress:   . Feeling of Stress :   Social Connections:   . Frequency of Communication with Friends and Family:   . Frequency of Social Gatherings with Friends and  Family:   . Attends Religious Services:   . Active Member of Clubs or Organizations:   . Attends Banker Meetings:   Marland Kitchen Marital Status:   Intimate Partner Violence:   . Fear of Current or Ex-Partner:   . Emotionally Abused:   Marland Kitchen Physically Abused:   . Sexually Abused:     Review of Systems: See HPI, otherwise negative ROS  Physical Exam: BP 114/71   Pulse 72   Temp 99 F (37.2 C) (Oral)   Resp (!) 24   Ht 5\' 1"  (1.549 m)   Wt 95.3 kg   SpO2 96%   BMI 39.68 kg/m  General:   Alert,  Well-developed, well-nourished, pleasant and cooperative in NAD Neck:  Supple; no masses or thyromegaly. No significant cervical adenopathy. Lungs:  Clear throughout to auscultation.   No wheezes, crackles, or rhonchi. No acute distress. Heart:  Regular rate and rhythm; no murmurs, clicks, rubs,  or gallops. Abdomen: Non-distended, normal bowel sounds.  Soft and nontender without appreciable mass or hepatosplenomegaly.  Pulses:  Normal pulses noted. Extremities:  Without clubbing or edema.  Impression/Plan: 29 year old lady with intermittent epigastric pain, poorly controlled reflux.  Here for diagnostic EGD per plan.  The risks, benefits, limitations, alternatives and imponderables have been reviewed with the patient. Potential for esophageal dilation, biopsy, etc. have also been reviewed.  Questions have been answered. All parties agreeable.     Notice: This dictation was prepared with Dragon dictation along with smaller phrase technology. Any transcriptional errors that result from this process are unintentional and may not be corrected upon review.

## 2020-02-05 NOTE — Op Note (Signed)
[facility] Patient Name: Allison Garcia Procedure Date: 02/05/2020 3:08 PM MRN: [id number] Date of Birth: 1990-11-12 Attending MD: Norvel Richards , MD CSN: [id number] Age: 29 Admit Type: Inpatient Procedure:                Upper GI endoscopy Indications:              Dyspepsia, Heartburn Providers:                Norvel Richards, MD, Gwynneth Albright RN,                            RN, Aram Candela Referring MD:              Medicines:                Propofol per Anesthesia Complications:            No immediate complications. Estimated Blood Loss:     Estimated blood loss: none. Procedure:                Pre-Anesthesia Assessment:                           - Prior to the procedure, a History and Physical                            was performed, and patient medications and                            allergies were reviewed. The patient's tolerance of                            previous anesthesia was also reviewed. The risks                            and benefits of the procedure and the sedation                            options and risks were discussed with the patient.                            All questions were answered, and informed consent                            was obtained. Prior Anticoagulants: The patient has                            taken no previous anticoagulant or antiplatelet                            agents. ASA Grade Assessment: II - A patient with                            mild systemic disease. After reviewing the risks  and benefits, the patient was deemed in                            satisfactory condition to undergo the procedure.                           After obtaining informed consent, the endoscope was                            passed under direct vision. Throughout the                            procedure, the patient's blood pressure, pulse, and                            oxygen saturations were  monitored continuously. The                            [id number]-H190 ([id number]) scope was introduced through the                            mouth, and advanced to the second part of duodenum.                            The upper GI endoscopy was accomplished without                            difficulty. The patient tolerated the procedure                            well. Scope In: 3:47:51 PM Scope Out: 3:51:39 PM Total Procedure Duration: 0 hours 3 minutes 48 seconds  Findings:      Four-quadrant distal esophageal erosions within 1 cm of the GE junction.       No Barrett's epithelium seen.      The entire examined stomach was normal.      The duodenal bulb and second portion of the duodenum were normal. Impression:               -Erosive reflux esophagitis..                           - Normal stomach.                           - Normal duodenal bulb and second portion of the                            duodenum.                           - No specimens collected. Findings likely goes a                            long way to explain her symptoms in view of the  fact that she takes her "daily PPI only                            sporadically. Moderate Sedation:      Moderate (conscious) sedation was personally administered by an       anesthesia professional. The following parameters were monitored: oxygen       saturation, heart rate, blood pressure, respiratory rate, EKG, adequacy       of pulmonary ventilation, and response to care. Recommendation:           - Patient has a contact number available for                            emergencies. The signs and symptoms of potential                            delayed complications were discussed with the                            patient. Return to normal activities tomorrow.                            Written discharge instructions were provided to the                            patient.                           -  Advance diet as tolerated.                           - Continue present medications. Increase Protonix                            to 40 mg twice daily. I recommend that she set her                            smart phone alarm keep her on a daily regimen.                           - Return to my office in 6 weeks. Procedure Code(s):        --- Professional ---                           [phone], Esophagogastroduodenoscopy, flexible,                            transoral; diagnostic, including collection of                            specimen(s) by brushing or washing, when performed                            (separate procedure) Diagnosis Code(s):        --- Professional ---  K20.90, Esophagitis, unspecified without bleeding                           R10.13, Epigastric pain                           R12, Heartburn CPT copyright 2019 American Medical Association. All rights reserved. The codes documented in this report are preliminary and upon coder review may  be revised to meet current compliance requirements. Allison Garcia. Allison Kegley, MD Allison Pac, MD 02/05/2020 4:05:47 PM This report has been signed electronically. Number of Addenda: 0

## 2020-02-05 NOTE — Transfer of Care (Signed)
Immediate Anesthesia Transfer of Care Note  Patient: Allison Garcia  Procedure(s) Performed: ESOPHAGOGASTRODUODENOSCOPY (EGD) WITH PROPOFOL (N/A )  Patient Location: PACU  Anesthesia Type:General  Level of Consciousness: awake, alert , oriented and sedated  Airway & Oxygen Therapy: Patient Spontanous Breathing  Post-op Assessment: Report given to RN and Post -op Vital signs reviewed and stable  Post vital signs: Reviewed and stable  Last Vitals:  Vitals Value Taken Time  BP 103/61 02/05/20 1558  Temp    Pulse 85 02/05/20 1601  Resp 16 02/05/20 1601  SpO2 99 % 02/05/20 1601  Vitals shown include unvalidated device data.  Last Pain:  Vitals:   02/05/20 1344  TempSrc: Oral  PainSc: 0-No pain      Patients Stated Pain Goal: 6 (02/05/20 1344)  Complications: No apparent anesthesia complications

## 2020-02-05 NOTE — Anesthesia Postprocedure Evaluation (Signed)
Anesthesia Post Note  Patient: Allison Garcia  Procedure(s) Performed: ESOPHAGOGASTRODUODENOSCOPY (EGD) WITH PROPOFOL (N/A )  Patient location during evaluation: PACU Anesthesia Type: General Level of consciousness: awake and alert and oriented Pain management: pain level controlled Vital Signs Assessment: post-procedure vital signs reviewed and stable Respiratory status: spontaneous breathing and respiratory function stable Cardiovascular status: blood pressure returned to baseline Postop Assessment: no apparent nausea or vomiting and adequate PO intake Anesthetic complications: no     Last Vitals:  Vitals:   02/05/20 1558 02/05/20 1600  BP: 103/61 106/64  Pulse: 80 71  Resp: 17 (!) 29  Temp: 36.8 C   SpO2: 99% 99%    Last Pain:  Vitals:   02/05/20 1600  TempSrc:   PainSc: 0-No pain                 [name]

## 2020-02-05 NOTE — Anesthesia Preprocedure Evaluation (Addendum)
Anesthesia Evaluation  Patient identified by MRN, date of birth, ID band Patient awake    Reviewed: Allergy & Precautions, NPO status , Patient's Chart, lab work & pertinent test results  History of Anesthesia Complications Negative for: history of anesthetic complications  Airway Mallampati: II  TM Distance: >3 FB Neck ROM: Full    Dental  (+) Chipped, Dental Advisory Given Broken teeth X4:   Pulmonary Current SmokerPatient did not abstain from smoking.,    Pulmonary exam normal breath sounds clear to auscultation       Cardiovascular hypertension, Pt. on medications Normal cardiovascular exam Rhythm:Regular Rate:Normal     Neuro/Psych negative neurological ROS  negative psych ROS   GI/Hepatic GERD  Medicated,(+)     substance abuse  alcohol use,   Endo/Other  Morbid obesity  Renal/GU negative Renal ROS     Musculoskeletal Back pain   Abdominal   Peds  Hematology   Anesthesia Other Findings   Reproductive/Obstetrics negative OB ROS                          Anesthesia Physical Anesthesia Plan  ASA: II  Anesthesia Plan: General   Post-op Pain Management:    Induction: Intravenous  PONV Risk Score and Plan: TIVA  Airway Management Planned: Nasal Cannula, Natural Airway and Simple Face Mask  Additional Equipment:   Intra-op Plan:   Post-operative Plan:   Informed Consent: I have reviewed the patients History and Physical, chart, labs and discussed the procedure including the risks, benefits and alternatives for the proposed anesthesia with the patient or authorized representative who has indicated his/her understanding and acceptance.     Dental advisory given  Plan Discussed with: CRNA  Anesthesia Plan Comments:         Anesthesia Quick Evaluation

## 2020-03-02 ENCOUNTER — Ambulatory Visit: Payer: No Typology Code available for payment source | Admitting: Gastroenterology

## 2020-03-19 ENCOUNTER — Ambulatory Visit: Payer: No Typology Code available for payment source | Admitting: Gastroenterology

## 2020-03-21 NOTE — Progress Notes (Signed)
Referring Provider: No ref. provider found Primary Care Physician:  Patient, No Pcp Per Primary GI Physician: Dr. Jena Gauss  Chief Complaint  Patient presents with  . pp f/u    ok as long as takes med    HPI:   Allison Garcia is a 29 y.o. female presenting today for follow-up of dyspepsia s/p EGD.  Patient was last seen in our office 11/27/2019 at the time of initial consult of abdominal pain.  She reported new onset intermittent epigastric pain in October/November.  Pain had been worsening with radiation to RUQ and LUQ. Admitted to drinking Pepsi daily and taking ibuprofen every morning.  Denied any association with greasy foods.  Denies any typical reflux symptoms.  No melena.  No weight loss or loss of appetite.  No dysphagia.  Prior evaluation in the ED with RUQ [place] March 2021 normal. No gallstones. WBC count 13.9, Hgb 16. Lipase 25. LFTs normal.   She was started on Protonix by the emergency room.  She is scheduled for EGD, HIDA, and continued on PPI daily.  Counseled on GERD diet advised to avoid NSAIDs.  HIDA scan not completed. EGD 02/05/2020: Erosive reflux esophagitis, normal examined stomach, normal examined duodenum.  Patient was still only taking Protonix sporadically.  Recommended increasing Protonix to twice daily and setting an alarm on her phone to keep her on a daily regimen.  Today: Taking Protonix BID when she can remember. Knows when she misses a dose which is about twice a week. Has an alarm on her phone. States she isn't at home when the alarm goes all the time. Misses the evening dose more than anything. No abdominal pain or GERD symptoms unless missing a dose. No dysphagia. No BRBPR or melna. Stopped ibuprofen completely. Will take Tylenol as needed. No N/V. Limiting Pepsi. Still working on limiting fried/fatty/greasy/spicy foods.   BMs daily. No constipation or diarrhea. No other GI concerns.   Past Medical History:  Diagnosis Date  . Abnormal Pap smear of cervix   .  Back pain   . GERD (gastroesophageal reflux disease) 03/22/2020  . History of stomach ulcers    told by ED. no EGD  . Preeclampsia     Past Surgical History:  Procedure Laterality Date  . CESAREAN SECTION    . ESOPHAGOGASTRODUODENOSCOPY (EGD) WITH PROPOFOL N/A 02/05/2020   Procedure: ESOPHAGOGASTRODUODENOSCOPY (EGD) WITH PROPOFOL;  Surgeon: Corbin Ade, MD;  Location: [facility];  Service: Endoscopy;  Laterality: N/A;  3:00pm  . TEAR DUCT PROBING     as baby    Current Outpatient Medications  Medication Sig Dispense Refill  . acetaminophen (TYLENOL) 500 MG tablet Take 1,000 mg by mouth every 6 (six) hours as needed for moderate pain.    Marland Kitchen albuterol (VENTOLIN HFA) 108 (90 Base) MCG/ACT inhaler Inhale 1-2 puffs into the lungs every 6 (six) hours as needed for wheezing or shortness of breath.    . levonorgestrel (KYLEENA) 19.5 MG IUD 19.5 mg by Intrauterine route once.    . pantoprazole (PROTONIX) 40 MG tablet Take 40 mg by mouth 2 (two) times daily.     No current facility-administered medications for this visit.    Allergies as of 03/22/2020 - Review Complete 03/22/2020  Allergen Reaction Noted  . Other Anaphylaxis and Shortness Of Breath 03/11/2019    Family History  Problem Relation Age of Onset  . COPD Other   . Gallbladder disease Mother   . Colon cancer Neg Hx   .  Colon polyps Neg Hx     Social History   Socioeconomic History  . Marital status: Single    Spouse name: Not on file  . Number of children: Not on file  . Years of education: Not on file  . Highest education level: Not on file  Occupational History  . Occupation: Nurse Tech    Comment: [employer]  Tobacco Use  . Smoking status: Current Every Day Smoker    Packs/day: 1.00    Years: 9.00    Pack years: 9.00    Types: Cigarettes  . Smokeless tobacco: Never Used  Vaping Use  . Vaping Use: Never used  Substance and Sexual Activity  . Alcohol use: Yes    Comment: occas  . Drug use: No  .  Sexual activity: Yes    Birth control/protection: Pill  Other Topics Concern  . Not on file  Social History Narrative  . Not on file   Social Determinants of Health   Financial Resource Strain:   . Difficulty of Paying Living Expenses:   Food Insecurity:   . Worried About Programme researcher, broadcasting/film/video in the Last Year:   . Barista in the Last Year:   Transportation Needs:   . Freight forwarder (Medical):   Marland Kitchen Lack of Transportation (Non-Medical):   Physical Activity:   . Days of Exercise per Week:   . Minutes of Exercise per Session:   Stress:   . Feeling of Stress :   Social Connections:   . Frequency of Communication with Friends and Family:   . Frequency of Social Gatherings with Friends and Family:   . Attends Religious Services:   . Active Member of Clubs or Organizations:   . Attends Banker Meetings:   Marland Kitchen Marital Status:     Review of Systems: Gen: Denies fever, chills, cold or flulike symptoms, lightheadedness, dizziness, presyncope, syncope. CV: Denies chest pain or palpitations. Resp: Denies dyspnea or cough. GI: See HPI Heme: See HPI  Physical Exam: BP 131/86   Pulse 79   Temp 98.3 F (36.8 C) (Oral)   Ht 5\' 2"  (1.575 m)   Wt 209 lb 9.6 oz (95.1 kg)   BMI 38.34 kg/m  General:   Alert and oriented. No distress noted. Pleasant and cooperative.  Head:  Normocephalic and atraumatic. Eyes:  Conjuctiva clear without scleral icterus. Heart:  S1, S2 present without murmurs appreciated. Lungs:  Clear to auscultation bilaterally. No wheezes, rales, or rhonchi. No distress.  Abdomen:  +BS, soft, and non-distended.  Minimal TTP in epigastric area.  No rebound or guarding. No HSM or masses noted. Msk:  Symmetrical without gross deformities. Normal posture. Extremities:  Without edema. Neurologic:  Alert and  oriented x4 Psych:  Normal mood and affect.

## 2020-03-22 ENCOUNTER — Encounter: Payer: Self-pay | Admitting: Gastroenterology

## 2020-03-22 ENCOUNTER — Ambulatory Visit (INDEPENDENT_AMBULATORY_CARE_PROVIDER_SITE_OTHER): Payer: No Typology Code available for payment source | Admitting: Gastroenterology

## 2020-03-22 ENCOUNTER — Other Ambulatory Visit: Payer: Self-pay

## 2020-03-22 DIAGNOSIS — K219 Gastro-esophageal reflux disease without esophagitis: Secondary | ICD-10-CM

## 2020-03-22 DIAGNOSIS — K2100 Gastro-esophageal reflux disease with esophagitis, without bleeding: Secondary | ICD-10-CM | POA: Diagnosis not present

## 2020-03-22 HISTORY — DX: Gastro-esophageal reflux disease without esophagitis: K21.9

## 2020-03-22 NOTE — Patient Instructions (Addendum)
Please continue taking Protonix 40 mg twice daily 30 minutes before breakfast and dinner.  Continue with setting an alarm on your phone to help you remember to take both doses daily.  You are doing great on making dietary changes to help prevent reflux symptoms.  I know it is difficult, but continue with your efforts!  GERD diet/lifestyle:  Avoid fried, fatty, greasy, spicy, citrus foods. Avoid caffeine and carbonated beverages. Avoid chocolate. Try eating 4-6 small meals a day rather than 3 large meals. Do not eat within 3 hours of laying down. Prop head of bed up on wood or bricks to create a 6 inch incline.  Continue to avoid all NSAIDs including ibuprofen, Aleve, Advil, Goody powders, naproxen, and anything that says "NSAID" on the package.  We will plan to see you back in 4 months.  Do not hesitate to call with questions or concerns prior.  Ermalinda Memos, PA-C [facility]   Food Choices for Gastroesophageal Reflux Disease, Adult When you have gastroesophageal reflux disease (GERD), the foods you eat and your eating habits are very important. Choosing the right foods can help ease the discomfort of GERD. Consider working with a diet and nutrition specialist (dietitian) to help you make healthy food choices. What general guidelines should I follow?  Eating plan  Choose healthy foods low in fat, such as fruits, vegetables, whole grains, low-fat dairy products, and lean meat, fish, and poultry.  Eat frequent, small meals instead of three large meals each day. Eat your meals slowly, in a relaxed setting. Avoid bending over or lying down until 2-3 hours after eating.  Limit high-fat foods such as fatty meats or fried foods.  Limit your intake of oils, butter, and shortening to less than 8 teaspoons each day.  Avoid the following: ? Foods that cause symptoms. These may be different for different people. Keep a food diary to keep track of foods that cause  symptoms. ? Alcohol. ? Drinking large amounts of liquid with meals. ? Eating meals during the 2-3 hours before bed.  Cook foods using methods other than frying. This may include baking, grilling, or broiling. Lifestyle  Maintain a healthy weight. Ask your health care provider what weight is healthy for you. If you need to lose weight, work with your health care provider to do so safely.  Exercise for at least 30 minutes on 5 or more days each week, or as told by your health care provider.  Avoid wearing clothes that fit tightly around your waist and chest.  Do not use any products that contain nicotine or tobacco, such as cigarettes and e-cigarettes. If you need help quitting, ask your health care provider.  Sleep with the head of your bed raised. Use a wedge under the mattress or blocks under the bed frame to raise the head of the bed. What foods are not recommended? The items listed may not be a complete list. Talk with your dietitian about what dietary choices are best for you. Grains Pastries or quick breads with added fat. [place] toast. Vegetables Deep fried vegetables. [place] fries. Any vegetables prepared with added fat. Any vegetables that cause symptoms. For some people this may include tomatoes and tomato products, chili peppers, onions and garlic, and horseradish. Fruits Any fruits prepared with added fat. Any fruits that cause symptoms. For some people this may include citrus fruits, such as oranges, grapefruit, pineapple, and lemons. Meats and other protein foods High-fat meats, such as fatty beef or pork, hot dogs, ribs,  ham, sausage, salami and bacon. Fried meat or protein, including fried fish and fried chicken. Nuts and nut butters. Dairy Whole milk and chocolate milk. Sour cream. Cream. Ice cream. Cream cheese. Milk shakes. Beverages Coffee and tea, with or without caffeine. Carbonated beverages. Sodas. Energy drinks. Fruit juice made with acidic fruits (such as orange  or grapefruit). Tomato juice. Alcoholic drinks. Fats and oils Butter. Margarine. Shortening. Ghee. Sweets and desserts Chocolate and cocoa. Donuts. Seasoning and other foods Pepper. Peppermint and spearmint. Any condiments, herbs, or seasonings that cause symptoms. For some people, this may include curry, hot sauce, or vinegar-based salad dressings. Summary  When you have gastroesophageal reflux disease (GERD), food and lifestyle choices are very important to help ease the discomfort of GERD.  Eat frequent, small meals instead of three large meals each day. Eat your meals slowly, in a relaxed setting. Avoid bending over or lying down until 2-3 hours after eating.  Limit high-fat foods such as fatty meat or fried foods. This information is not intended to replace advice given to you by your health care provider. Make sure you discuss any questions you have with your health care provider. Document Revised: 12/12/2018 Document Reviewed: 08/22/2016 Elsevier Patient Education  2020 [employer].

## 2020-03-22 NOTE — Assessment & Plan Note (Addendum)
Well-controlled as long as she takes Protonix 40 mg twice daily.  She does occasionally miss her evening dose and states she can tell.  Previously with concerns of dyspepsia in March 2021.  These symptoms have essentially resolved with Protonix twice daily unless she misses a dose.  EGD in June 2021 with erosive reflux esophagitis. She started on BID dosing of Protonix at that time.  She is without alarm symptoms.  Plan: Continue Protonix 40 mg twice daily 30 minutes before breakfast and dinner. Congratulated on her efforts to make dietary changes.  Encouraged her to continue making further changes. Counseled on GERD diet/lifestyle.  Handout provided. Continue to avoid NSAIDs. Follow-up in 4 months.  We will likely plan to decrease Protonix to once daily at that time.

## 2020-03-22 NOTE — Assessment & Plan Note (Addendum)
29 year old female who had presented with concerns of dyspepsia in March 2021.  EGD in June 2021 revealing erosive reflux esophagitis.  She started on Protonix 40 mg twice daily and has since had improvement/resolution of epigastric discomfort as long as she is taking Protonix.  GERD well controlled as long as she takes her medication.  States she can tell when she misses a dose.  She has an alarm on her phone to help her remember to take medication.  She has also essentially quit drinking Pepsi and discontinued NSAIDs.  She is continuing to work on dietary changes to avoid fried/fatty/greasy/spicy foods.  No alarm symptoms.  Plan: Continue Protonix 40 mg twice daily 30 minutes before breakfast and dinner. Congratulated on her efforts to make dietary changes.  Encouraged her to continue making further changes. Counseled on GERD diet/lifestyle.  Handout provided. Continue to avoid NSAIDs. Follow-up in 4 months.  We will likely plan to decrease Protonix to once daily at that time.

## 2020-07-23 ENCOUNTER — Ambulatory Visit: Payer: No Typology Code available for payment source | Admitting: Gastroenterology

## 2021-01-10 ENCOUNTER — Encounter: Payer: Self-pay | Admitting: Emergency Medicine

## 2021-01-10 ENCOUNTER — Other Ambulatory Visit: Payer: Self-pay

## 2021-01-10 ENCOUNTER — Ambulatory Visit
Admission: EM | Admit: 2021-01-10 | Discharge: 2021-01-10 | Disposition: A | Discharge status: Home / Self Care | Payer: Self-pay | Attending: Family Medicine | Admitting: Family Medicine

## 2021-01-10 DIAGNOSIS — J039 Acute tonsillitis, unspecified: Secondary | ICD-10-CM

## 2021-01-10 DIAGNOSIS — H66002 Acute suppurative otitis media without spontaneous rupture of ear drum, left ear: Secondary | ICD-10-CM

## 2021-01-10 LAB — POCT RAPID STREP A (OFFICE): Rapid Strep A Screen: NEGATIVE

## 2021-01-10 MED ORDER — METHYLPREDNISOLONE SODIUM SUCC 125 MG IJ SOLR
125.0000 mg | Freq: Once | INTRAMUSCULAR | Status: AC
  Administered 2021-01-10: 125 mg via INTRAMUSCULAR

## 2021-01-10 MED ORDER — CEFTRIAXONE SODIUM 1 G IJ SOLR
1.0000 g | Freq: Once | INTRAMUSCULAR | Status: AC
  Administered 2021-01-10: 1 g via INTRAMUSCULAR

## 2021-01-10 MED ORDER — PREDNISONE 10 MG (21) PO TBPK: ORAL_TABLET | Freq: Every day | ORAL | 0 refills | Status: AC

## 2021-01-10 MED ORDER — AMOXICILLIN-POT CLAVULANATE 875-125 MG PO TABS: 1.0000 | ORAL_TABLET | Freq: Two times a day (BID) | ORAL | 0 refills | Status: AC

## 2021-01-10 NOTE — ED Provider Notes (Signed)
[facility]    CSN: [id number] Arrival date & time: 01/10/21  1117      History   Chief Complaint Chief Complaint  Patient presents with  . Sore Throat    HPI Allison Garcia is a 30 y.o. female.   Reports cough, nasal congestion, rhinorrhea for the last week. Reports sore throat, bilateral ear pain and low grade temp as well for the last 3 days. Reports negative home Covid test x 2 days ago. Has positive hx Covid. Has completed Covid vaccines. Has not completed flu vaccine. Denies sick contacts. Denies nausea, vomiting, rash, body aches, other symptoms.  ROS per HPI  The history is provided by the patient.  Sore Throat    Past Medical History:  Diagnosis Date  . Abnormal Pap smear of cervix   . Back pain   . GERD (gastroesophageal reflux disease) 03/22/2020  . History of stomach ulcers    told by ED. no EGD  . Preeclampsia     Patient Active Problem List   Diagnosis Date Noted  . Reflux esophagitis 03/22/2020  . GERD (gastroesophageal reflux disease) 03/22/2020  . Dyspepsia 11/27/2019    Past Surgical History:  Procedure Laterality Date  . CESAREAN SECTION    . ESOPHAGOGASTRODUODENOSCOPY (EGD) WITH PROPOFOL N/A 02/05/2020   Procedure: ESOPHAGOGASTRODUODENOSCOPY (EGD) WITH PROPOFOL;  Surgeon: Corbin Ade, MD;  Erosive reflux esophagitis, normal examined stomach, normal examined duodenum.  Carlis Abbott DUCT PROBING     as baby    OB History    Gravida      Para      Term      Preterm      AB      Living  1     SAB      IAB      Ectopic      Multiple      Live Births               Home Medications    Prior to Admission medications   Medication Sig Start Date End Date Taking? Authorizing Provider  amoxicillin-clavulanate (AUGMENTIN) 875-125 MG tablet Take 1 tablet by mouth 2 (two) times daily for 7 days. 01/10/21 01/17/21 Yes Moshe Cipro, NP  predniSONE (STERAPRED UNI-PAK 21 TAB) 10 MG (21) TBPK tablet Take by mouth daily  for 6 days. Take 6 tablets on day 1, 5 tablets on day 2, 4 tablets on day 3, 3 tablets on day 4, 2 tablets on day 5, 1 tablet on day 6 01/10/21 01/16/21 Yes Moshe Cipro, NP  acetaminophen (TYLENOL) 500 MG tablet Take 1,000 mg by mouth every 6 (six) hours as needed for moderate pain.    [provider]  albuterol (VENTOLIN HFA) 108 (90 Base) MCG/ACT inhaler Inhale 1-2 puffs into the lungs every 6 (six) hours as needed for wheezing or shortness of breath.    [provider]  levonorgestrel (KYLEENA) 19.5 MG IUD 19.5 mg by Intrauterine route once.    [provider]  pantoprazole (PROTONIX) 40 MG tablet Take 40 mg by mouth 2 (two) times daily.    [provider]  levonorgestrel-ethinyl estradiol (SEASONALE,INTROVALE,JOLESSA) 0.15-0.03 MG tablet Take 1 tablet by mouth at bedtime.  08/05/19  [provider]    Family History Family History  Problem Relation Age of Onset  . COPD Other   . Gallbladder disease Mother   . Colon cancer Neg Hx   . Colon polyps Neg Hx  Social History Social History   Tobacco Use  . Smoking status: Current Every Day Smoker    Packs/day: 1.00    Years: 9.00    Pack years: 9.00    Types: Cigarettes  . Smokeless tobacco: Never Used  Vaping Use  . Vaping Use: Never used  Substance Use Topics  . Alcohol use: Yes    Comment: occas  . Drug use: No     Allergies   Other   Review of Systems Review of Systems   Physical Exam Triage Vital Signs ED Triage Vitals [01/10/21 1154]  Enc Vitals Group     BP 132/90     Pulse Rate 79     Resp 17     Temp 99 F (37.2 C)     Temp Source Oral     SpO2 97 %     Weight      Height      Head Circumference      Peak Flow      Pain Score      Pain Loc      Pain Edu?      Excl. in GC?    No data found.  Updated Vital Signs BP 132/90 (BP Location: Right Arm)   Pulse 79   Temp 99 F (37.2 C) (Oral)   Resp 17   SpO2 97%       Physical Exam Vitals  and nursing note reviewed.  Constitutional:      General: She is not in acute distress.    Appearance: Normal appearance. She is well-developed and normal weight. She is not ill-appearing.  HENT:     Head: Normocephalic and atraumatic.     Right Ear: Tympanic membrane, ear canal and external ear normal.     Left Ear: Tympanic membrane, ear canal and external ear normal.     Nose: Nose normal.     Mouth/Throat:     Mouth: Mucous membranes are moist.     Pharynx: Posterior oropharyngeal erythema present.  Eyes:     Extraocular Movements: Extraocular movements intact.     Conjunctiva/sclera: Conjunctivae normal.     Pupils: Pupils are equal, round, and reactive to light.  Cardiovascular:     Rate and Rhythm: Normal rate and regular rhythm.     Heart sounds: Normal heart sounds. No murmur heard.   Pulmonary:     Effort: Pulmonary effort is normal. No respiratory distress.     Breath sounds: Normal breath sounds. No stridor. No wheezing, rhonchi or rales.  Chest:     Chest wall: No tenderness.  Abdominal:     General: Bowel sounds are normal. There is no distension.     Palpations: Abdomen is soft. There is no mass.     Tenderness: There is no abdominal tenderness. There is no right CVA tenderness, left CVA tenderness, guarding or rebound.     Hernia: No hernia is present.  Musculoskeletal:        General: Normal range of motion.     Cervical back: Neck supple.  Lymphadenopathy:     Cervical: Cervical adenopathy present.  Skin:    General: Skin is warm and dry.     Capillary Refill: Capillary refill takes less than 2 seconds.  Neurological:     General: No focal deficit present.     Mental Status: She is alert and oriented to person, place, and time.  Psychiatric:        Mood and Affect: Mood normal.  Behavior: Behavior normal.        Thought Content: Thought content normal.        Judgment: Judgment normal.      UC Treatments / Results  Labs (all labs ordered  are listed, but only abnormal results are displayed) Labs Reviewed  CULTURE, GROUP A STREP [facility])  POCT RAPID STREP A (OFFICE)    EKG   Radiology No results found.  Procedures Procedures (including critical care time)  Medications Ordered in UC Medications  cefTRIAXone (ROCEPHIN) injection 1 g (1 g Intramuscular Given 01/10/21 1225)  methylPREDNISolone sodium succinate (SOLU-MEDROL) 125 mg/2 mL injection 125 mg (125 mg Intramuscular Given 01/10/21 1225)    Initial Impression / Assessment and Plan / UC Course  I have reviewed the triage vital signs and the nursing notes.  Pertinent labs & imaging results that were available during my care of the patient were reviewed by me and considered in my medical decision making (see chart for details).    Acute tonsillitis Left Otitis Media  Rapid strep negative in office today Will culture and inform of abnormal results, and will treat accordingly Solumedrol 125mg  IM in office today Rocephin 1g IM in office today Prescribed steroid taper Prescribed Augmentin BID x 7 days Work note provided Follow up with this office or with PCP if symptoms are persisting Follow up with the ER for worsening symptoms, high fever, trouble swallowing, trouble breathing, other concerning symptoms Final Clinical Impressions(s) / UC Diagnoses   Final diagnoses:  Acute tonsillitis, unspecified etiology  Non-recurrent acute suppurative otitis media of left ear without spontaneous rupture of tympanic membrane     Discharge Instructions     Your rapid strep test is negative.  A throat culture is pending; we will call you if it is positive requiring treatment.    You have received a steroid injection in the office to help with the swelling in your neck  You have also received an antibiotic injection in the office as well  I have sent in Augmentin for you to take twice a day for 7 days.  I have sent in a prednisone taper for you to take for 6 days. 6  tablets on day one, 5 tablets on day two, 4 tablets on day three, 3 tablets on day four, 2 tablets on day five, and 1 tablet on day six.  Follow up with this office or with PCP if symptoms are persisting tomorrow  Follow up with the ER for worsening symptoms, high fever, trouble swallowing, trouble breathing, other concerning symptoms     ED Prescriptions    Medication Sig Dispense Auth. Provider   predniSONE (STERAPRED UNI-PAK 21 TAB) 10 MG (21) TBPK tablet Take by mouth daily for 6 days. Take 6 tablets on day 1, 5 tablets on day 2, 4 tablets on day 3, 3 tablets on day 4, 2 tablets on day 5, 1 tablet on day 6 21 tablet , NP   amoxicillin-clavulanate (AUGMENTIN) 875-125 MG tablet Take 1 tablet by mouth 2 (two) times daily for 7 days. 14 tablet Moshe Cipro, NP     PDMP not reviewed this encounter.   Moshe Cipro, NP 01/10/21 1226

## 2021-01-10 NOTE — Discharge Instructions (Addendum)
Your rapid strep test is negative.  A throat culture is pending; we will call you if it is positive requiring treatment.    You have received a steroid injection in the office to help with the swelling in your neck  You have also received an antibiotic injection in the office as well  I have sent in Augmentin for you to take twice a day for 7 days.  I have sent in a prednisone taper for you to take for 6 days. 6 tablets on day one, 5 tablets on day two, 4 tablets on day three, 3 tablets on day four, 2 tablets on day five, and 1 tablet on day six.  Follow up with this office or with PCP if symptoms are persisting tomorrow  Follow up with the ER for worsening symptoms, high fever, trouble swallowing, trouble breathing, other concerning symptoms

## 2021-01-10 NOTE — ED Triage Notes (Addendum)
Sore throat, body aches,  cough, runny nose.  Neg at home covid test

## 2021-01-13 LAB — CULTURE, GROUP A STREP (THRC)

## 2021-03-28 ENCOUNTER — Emergency Department (HOSPITAL_COMMUNITY)
Admission: EM | Admit: 2021-03-28 | Discharge: 2021-03-28 | Disposition: A | Discharge status: Home / Self Care | Payer: Self-pay | Attending: Emergency Medicine | Admitting: Emergency Medicine

## 2021-03-28 ENCOUNTER — Other Ambulatory Visit: Payer: Self-pay

## 2021-03-28 ENCOUNTER — Encounter (HOSPITAL_COMMUNITY): Payer: Self-pay | Admitting: Emergency Medicine

## 2021-03-28 ENCOUNTER — Emergency Department (HOSPITAL_COMMUNITY): Payer: Self-pay

## 2021-03-28 DIAGNOSIS — M25572 Pain in left ankle and joints of left foot: Secondary | ICD-10-CM | POA: Insufficient documentation

## 2021-03-28 DIAGNOSIS — W1841XA Slipping, tripping and stumbling without falling due to stepping on object, initial encounter: Secondary | ICD-10-CM | POA: Insufficient documentation

## 2021-03-28 DIAGNOSIS — S93402A Sprain of unspecified ligament of left ankle, initial encounter: Secondary | ICD-10-CM | POA: Insufficient documentation

## 2021-03-28 DIAGNOSIS — F17210 Nicotine dependence, cigarettes, uncomplicated: Secondary | ICD-10-CM | POA: Insufficient documentation

## 2021-03-28 MED ORDER — NAPROXEN 250 MG PO TABS
500.0000 mg | ORAL_TABLET | Freq: Once | ORAL | Status: AC
  Administered 2021-03-28: 500 mg via ORAL
  Filled 2021-03-28: qty 2

## 2021-03-28 NOTE — ED Triage Notes (Signed)
Pt c/o left ankle pain after tripping over toy this morning.

## 2021-03-28 NOTE — Discharge Instructions (Addendum)
You were evaluated in the Emergency Department and after careful evaluation, we did not find any emergent condition requiring admission or further testing in the hospital.  Your exam/testing today was overall reassuring.  X-ray is without any broken bones.  Suspect sprain.  Use the brace and the crutches as needed for comfort, use anti-inflammatories at home for pain.  Please return to the Emergency Department if you experience any worsening of your condition.  Thank you for allowing [place] to be a part of your care.

## 2021-03-28 NOTE — ED Provider Notes (Signed)
AP-EMERGENCY DEPT [facility] Emergency Department Provider Note MRN:  [id number]  Arrival date & time: 03/28/21     Chief Complaint   Ankle Pain   History of Present Illness   Allison Garcia is a 30 y.o. year-old female with no pertinent past medical history presenting to the ED with chief complaint of ankle pain.  Patient tripped over a child's toy that was on the ground, rolled her ankle.  Endorsing moderate to severe pain to the lateral aspect of the left ankle since.  Denies falling, no other trauma, no head trauma, no loss of consciousness, no other injuries or complaints.  Review of Systems  A problem-focused ROS was performed. Positive for ankle pain.  Patient denies head trauma.  Patient's Health History    Past Medical History:  Diagnosis Date   Abnormal Pap smear of cervix    Back pain    GERD (gastroesophageal reflux disease) 03/22/2020   History of stomach ulcers    told by ED. no EGD   Preeclampsia     Past Surgical History:  Procedure Laterality Date   CESAREAN SECTION     ESOPHAGOGASTRODUODENOSCOPY (EGD) WITH PROPOFOL N/A 02/05/2020   Procedure: ESOPHAGOGASTRODUODENOSCOPY (EGD) WITH PROPOFOL;  Surgeon: Corbin Ade, MD;  Erosive reflux esophagitis, normal examined stomach, normal examined duodenum.   TEAR DUCT PROBING     as baby    Family History  Problem Relation Age of Onset   COPD Other    Gallbladder disease Mother    Colon cancer Neg Hx    Colon polyps Neg Hx     Social History   Socioeconomic History   Marital status: Single    Spouse name: Not on file   Number of children: Not on file   Years of education: Not on file   Highest education level: Not on file  Occupational History   Occupation: Nurse Tech    Comment: [place] [employer]  Tobacco Use   Smoking status: Every Day    Packs/day: 1.00    Years: 9.00    Pack years: 9.00    Types: Cigarettes   Smokeless tobacco: Never  Vaping Use   Vaping Use: Never used  Substance and  Sexual Activity   Alcohol use: Yes    Comment: occas   Drug use: No   Sexual activity: Yes    Birth control/protection: Pill  Other Topics Concern   Not on file  Social History Narrative   Not on file   Social Determinants of Health   Financial Resource Strain: Not on file  Food Insecurity: Not on file  Transportation Needs: Not on file  Physical Activity: Not on file  Stress: Not on file  Social Connections: Not on file  Intimate Partner Violence: Not on file     Physical Exam   Vitals:   03/28/21 0536  BP: (!) 128/96  Pulse: 76  Resp: 18  Temp: 98.6 F (37 C)  SpO2: 95%    CONSTITUTIONAL: Well-appearing, NAD NEURO:  Alert and oriented x 3, no focal deficits EYES:  eyes equal and reactive ENT/NECK:  no LAD, no JVD CARDIO: Regular rate, well-perfused, normal S1 and S2 PULM:  CTAB no wheezing or rhonchi GI/GU:  normal bowel sounds, non-distended, non-tender MSK/SPINE: Bruising and tenderness to the left lateral malleolus SKIN:  no rash, atraumatic PSYCH:  Appropriate speech and behavior  *Additional and/or pertinent findings included in MDM below  Diagnostic and Interventional Summary    EKG Interpretation  Date/Time:  Ventricular Rate:    PR Interval:    QRS Duration:   QT Interval:    QTC Calculation:   R Axis:     Text Interpretation:         Labs Reviewed - No data to display  DG Ankle Complete Left  Final Result      Medications  naproxen (NAPROSYN) tablet 500 mg (has no administration in time range)     Procedures  /  Critical Care Procedures  ED Course and Medical Decision Making  I have reviewed the triage vital signs, the nursing notes, and pertinent available records from the EMR.  Listed above are laboratory and imaging tests that I personally ordered, reviewed, and interpreted and then considered in my medical decision making (see below for details).  X-ray negative, neurovascularly intact, suspect sprain, no other injuries,  appropriate for discharge.       Elmer Sow. Pilar Plate, MD [facility] Emergency Medicine [facility] [email or web address]  Final Clinical Impressions(s) / ED Diagnoses     ICD-10-CM   1. Sprain of left ankle, unspecified ligament, initial encounter  V95.638V       ED Discharge Orders     None        Discharge Instructions Discussed with and Provided to Patient:    Discharge Instructions      You were evaluated in the Emergency Department and after careful evaluation, we did not find any emergent condition requiring admission or further testing in the hospital.  Your exam/testing today was overall reassuring.  X-ray is without any broken bones.  Suspect sprain.  Use the brace and the crutches as needed for comfort, use anti-inflammatories at home for pain.  Please return to the Emergency Department if you experience any worsening of your condition.  Thank you for allowing [place] to be a part of your care.        Sabas Sous, MD 03/28/21 [phone]

## 2021-07-22 ENCOUNTER — Emergency Department (HOSPITAL_COMMUNITY)
Admission: EM | Admit: 2021-07-22 | Discharge: 2021-07-22 | Disposition: A | Discharge status: Home / Self Care | Payer: Self-pay | Attending: Emergency Medicine | Admitting: Emergency Medicine

## 2021-07-22 ENCOUNTER — Other Ambulatory Visit: Payer: Self-pay

## 2021-07-22 ENCOUNTER — Emergency Department (HOSPITAL_COMMUNITY): Payer: Self-pay

## 2021-07-22 DIAGNOSIS — H9209 Otalgia, unspecified ear: Secondary | ICD-10-CM | POA: Insufficient documentation

## 2021-07-22 DIAGNOSIS — Z20822 Contact with and (suspected) exposure to covid-19: Secondary | ICD-10-CM | POA: Insufficient documentation

## 2021-07-22 DIAGNOSIS — J3489 Other specified disorders of nose and nasal sinuses: Secondary | ICD-10-CM | POA: Insufficient documentation

## 2021-07-22 DIAGNOSIS — F17210 Nicotine dependence, cigarettes, uncomplicated: Secondary | ICD-10-CM | POA: Insufficient documentation

## 2021-07-22 DIAGNOSIS — J069 Acute upper respiratory infection, unspecified: Secondary | ICD-10-CM | POA: Insufficient documentation

## 2021-07-22 LAB — RESP PANEL BY RT-PCR (FLU A&B, COVID) ARPGX2
Influenza A by PCR: NEGATIVE
Influenza B by PCR: NEGATIVE
SARS Coronavirus 2 by RT PCR: NEGATIVE

## 2021-07-22 MED ORDER — ALBUTEROL SULFATE HFA 108 (90 BASE) MCG/ACT IN AERS
1.0000 | INHALATION_SPRAY | Freq: Four times a day (QID) | RESPIRATORY_TRACT | Status: DC | PRN
  Administered 2021-07-22: 1 via RESPIRATORY_TRACT
  Filled 2021-07-22: qty 6.7

## 2021-07-22 MED ORDER — IPRATROPIUM-ALBUTEROL 0.5-2.5 (3) MG/3ML IN SOLN
3.0000 mL | Freq: Once | RESPIRATORY_TRACT | Status: AC
  Administered 2021-07-22: 3 mL via RESPIRATORY_TRACT
  Filled 2021-07-22: qty 3

## 2021-07-22 MED ORDER — ALBUTEROL SULFATE HFA 108 (90 BASE) MCG/ACT IN AERS: 1.0000 | INHALATION_SPRAY | Freq: Every day | RESPIRATORY_TRACT | Status: DC | PRN

## 2021-07-22 NOTE — Discharge Instructions (Signed)
Seen here today for evaluation of your cough and cold symptoms.  You had a negative COVID, flu, and RSV.  Your chest x-ray was normal.  You been prescribed albuterol to use as needed for your symptoms.  Additionally you can try over-the-counter cough and cold medication, just do not use this with Tylenol as this can cause an overdose.  If you have any concern, new or worsening symptoms, please return to the nearest emergency department for reevaluation.

## 2021-07-22 NOTE — ED Provider Notes (Signed)
[facility] EMERGENCY DEPARTMENT Provider Note   CSN: [id number] Arrival date & time: 07/22/21  1557     History Chief Complaint  Patient presents with   Cough    Allison Garcia is a 30 y.o. female resenting to the emergency department for evaluation of ear pain, sore throat, cough, sneezing, rhinorrhea, nasal vision, myalgia, and sinus headache for the past 3 days.  She denies any chest pain, shortness breath, dental pain, nausea.  The patient reports she had 1 episode of posttussis emesis yesterday.  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  She has been vaccinated for COVID, but not the flu.  She denies any medical history.  Surgical history includes C-sections.  She denies any other medications.  No known drug allergies.  Patient is a daily smoker.    Cough Associated symptoms: ear pain, headaches, rhinorrhea and sore throat   Associated symptoms: no chest pain, no chills, no fever, no rash and no shortness of breath       Past Medical History:  Diagnosis Date   Abnormal Pap smear of cervix    Back pain    GERD (gastroesophageal reflux disease) 03/22/2020   History of stomach ulcers    told by ED. no EGD   Preeclampsia     Patient Active Problem List   Diagnosis Date Noted   Reflux esophagitis 03/22/2020   GERD (gastroesophageal reflux disease) 03/22/2020   Dyspepsia 11/27/2019    Past Surgical History:  Procedure Laterality Date   CESAREAN SECTION     ESOPHAGOGASTRODUODENOSCOPY (EGD) WITH PROPOFOL N/A 02/05/2020   Procedure: ESOPHAGOGASTRODUODENOSCOPY (EGD) WITH PROPOFOL;  Surgeon: Daneil Dolin, MD;  Erosive reflux esophagitis, normal examined stomach, normal examined duodenum.   TEAR DUCT PROBING     as baby     OB History     Gravida      Para      Term      Preterm      AB      Living  1      SAB      IAB      Ectopic      Multiple      Live Births               Family History  Problem Relation Age of Onset   COPD Other    Gallbladder disease Mother    Colon cancer Neg Hx    Colon polyps Neg Hx     Social History   Tobacco Use   Smoking status: Every Day    Packs/day: 1.00    Years: 9.00    Pack years: 9.00    Types: Cigarettes   Smokeless tobacco: Never  Vaping Use   Vaping Use: Never used  Substance Use Topics   Alcohol use: Yes    Comment: occas   Drug use: No    Home Medications Prior to Admission medications   Medication Sig Start Date End Date Taking? Authorizing Provider  acetaminophen (TYLENOL) 500 MG tablet Take 1,000 mg by mouth every 6 (six) hours as needed for moderate pain.    [provider]  albuterol (VENTOLIN HFA) 108 (90 Base) MCG/ACT inhaler Inhale 1-2 puffs into the lungs every 6 (six) hours as needed for wheezing or shortness of breath.    [provider]  levonorgestrel (KYLEENA) 19.5 MG IUD 19.5 mg by Intrauterine  route once.    [provider]  pantoprazole (PROTONIX) 40 MG tablet Take 40 mg by mouth 2 (two) times daily.    [provider]  levonorgestrel-ethinyl estradiol (SEASONALE,INTROVALE,JOLESSA) 0.15-0.03 MG tablet Take 1 tablet by mouth at bedtime.  08/05/19  [provider]    Allergies    Other  Review of Systems   Review of Systems  Constitutional:  Negative for chills and fever.  HENT:  Positive for congestion, ear pain, rhinorrhea, sinus pressure and sore throat. Negative for dental problem, drooling and trouble swallowing.   Eyes:  Negative for pain and visual disturbance.  Respiratory:  Positive for cough. Negative for shortness of breath.   Cardiovascular:  Negative for chest pain and palpitations.  Gastrointestinal:  Positive for vomiting (Posttussive). Negative for abdominal pain, constipation, diarrhea and nausea.  Genitourinary:  Negative for dysuria and hematuria.  Musculoskeletal:  Negative for arthralgias and back pain.  Skin:   Negative for color change and rash.  Neurological:  Positive for headaches. Negative for seizures and syncope.  All other systems reviewed and are negative.  Physical Exam Updated Vital Signs BP 128/75 (BP Location: Left Arm)   Pulse 71   Temp 98.5 F (36.9 C) (Oral)   Resp 18   SpO2 100%   Physical Exam Vitals and nursing note reviewed.  Constitutional:      General: She is not in acute distress.    Appearance: Normal appearance. She is not toxic-appearing.  HENT:     Head: Normocephalic and atraumatic.     Right Ear: Tympanic membrane, ear canal and external ear normal.     Left Ear: Tympanic membrane, ear canal and external ear normal.     Nose:     Comments: Bilateral nasal turbinate erythema and edema with scant clear nasal discharge.    Mouth/Throat:     Mouth: Mucous membranes are moist.     Pharynx: Oropharynx is clear. No oropharyngeal exudate or posterior oropharyngeal erythema.     Comments: No pharyngeal erythema, edema, or exudate.  Uvula midline.  Airway patent.  Tonsils appear normal.  No voice changes. Eyes:     General: No scleral icterus. Cardiovascular:     Rate and Rhythm: Normal rate and regular rhythm.  Pulmonary:     Effort: Pulmonary effort is normal. No respiratory distress.     Comments: Diminished breath sounds at the bases.  No respiratory distress.  Patient satting 98% on room air.  No accessory muscle use, tripoding, nasal flaring, or cyanosis present.  Patient speaking in full sentences with ease. Abdominal:     General: Abdomen is flat. Bowel sounds are normal.     Palpations: Abdomen is soft.  Musculoskeletal:        General: No deformity.     Cervical back: Normal range of motion.  Lymphadenopathy:     Cervical: No cervical adenopathy.  Skin:    General: Skin is warm and dry.  Neurological:     General: No focal deficit present.     Mental Status: She is alert. Mental status is at baseline.    ED Results / Procedures / Treatments    Labs (all labs ordered are listed, but only abnormal results are displayed) Labs Reviewed  RESP PANEL BY RT-PCR (FLU A&B, COVID) [id number]    EKG None  Radiology DG Chest 2 View  Result Date: 07/22/2021 CLINICAL DATA:  Cough and headache with ear pain x3 days. EXAM: CHEST - 2 VIEW COMPARISON:  August 07, 2012 FINDINGS: The heart size and mediastinal contours are within normal limits. Both lungs are clear. Bilateral radiopaque nipple piercings are seen. The visualized skeletal structures are unremarkable. IMPRESSION: No active cardiopulmonary disease. Electronically Signed   By: Aram Candela M.D.   On: 07/22/2021 19:09    Procedures Procedures   Medications Ordered in ED Medications  albuterol (VENTOLIN HFA) 108 (90 Base) MCG/ACT inhaler 1-2 puff (has no administration in time range)  ipratropium-albuterol (DUONEB) 0.5-2.5 (3) MG/3ML nebulizer solution 3 mL (3 mLs Nebulization Given 07/22/21 1933)    ED Course  I have reviewed the triage vital signs and the nursing notes.  Pertinent labs & imaging results that were available during my care of the patient were reviewed by me and considered in my medical decision making (see chart for details).  30 year old female presents the emergency department with flulike symptoms for the past 3 days.  COVID, flu, and RSV negative.  Patient does have diminished breath sounds bilaterally and she reports it "feels of the last time she had bronchitis".  Ordered DuoNeb and chest x-ray.  Chest x-ray shows no active cardiopulmonary process.  No pneumonia or pneumothorax.  Low suspicions for strep throat or PTA as patient had reassuring ENT exam.  Will prescribe patient albuterol inhaler for at home use.  At this time patient is safe for discharge as she is in no acute distress, no respiratory distress no pharyngeal erythema, vital signs stable.  Patient afebrile in the emergency department.  Reports she is feeling better after the DuoNeb treatment.   Results discussed with patient.  Return precautions discussed.  Patient agrees to plan.  Patient is stable and being discharged home in good condition.   MDM Rules/Calculators/A&P                         Final Clinical Impression(s) / ED Diagnoses Final diagnoses:  Viral URI with cough    Rx / DC Orders ED Discharge Orders     None        Achille Rich, PA-C 07/22/21 1943    Terald Sleeper, MD 07/23/21 0021

## 2021-07-22 NOTE — ED Triage Notes (Signed)
Pt reports ear pain, throat pain, cough, HA x 3 days. Has been coughing so much that she vomited but not nauseated otherwise. Stepmother has flu. Denies daily medication.

## 2022-01-30 IMAGING — DX DG LUMBAR SPINE COMPLETE 4+V
5 series · 5 of 5 positions shown · non-contrast
Comparison: October 25, 2010

CLINICAL DATA: Low back pain after moving heavy object

EXAM:
LUMBAR SPINE - COMPLETE 4+ VIEW

[l-spine ap]
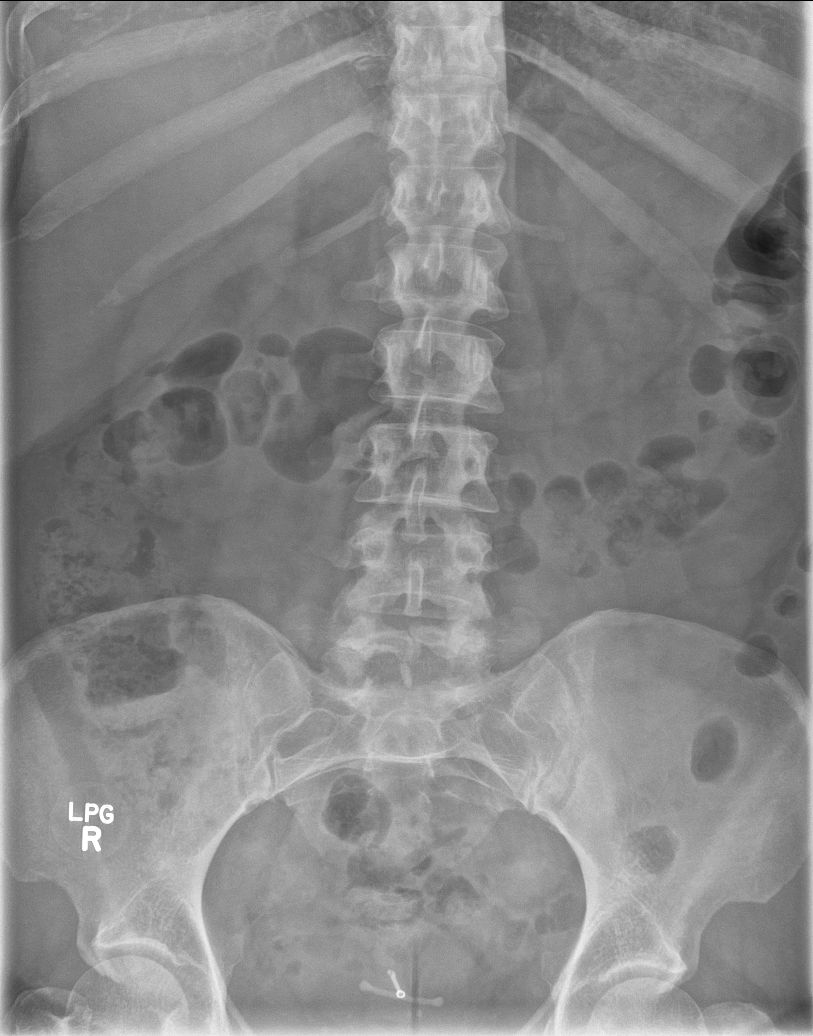

[l-spine obl (1 of 2)]
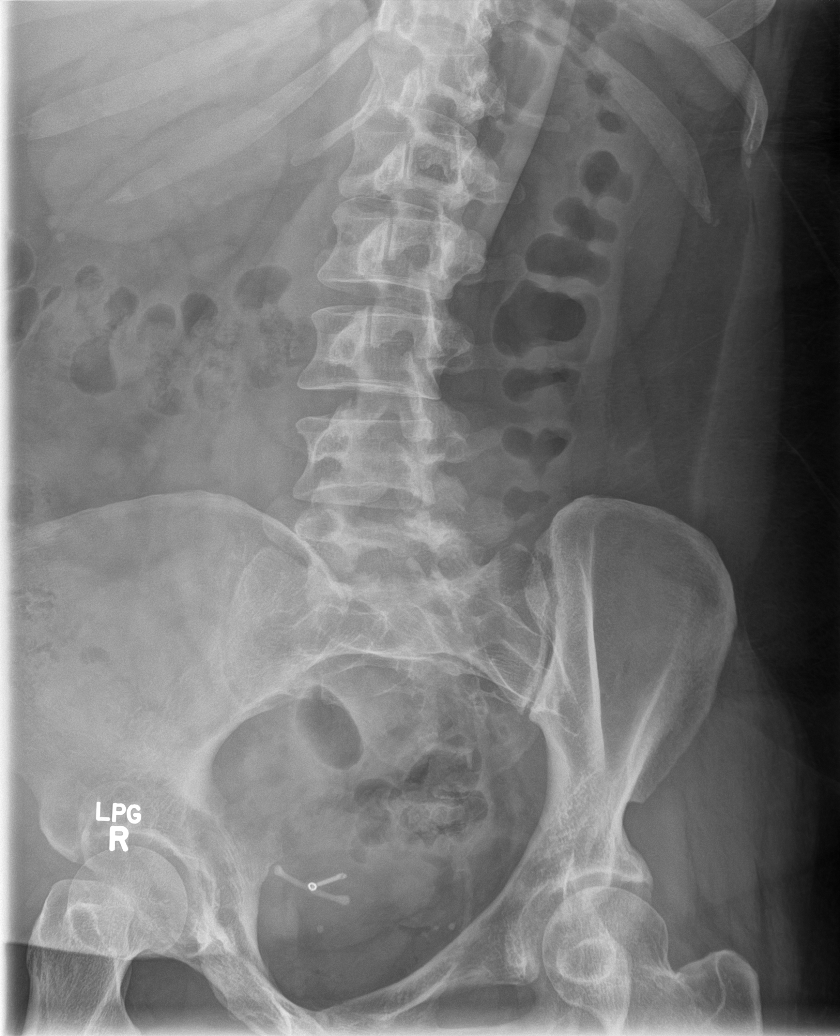

[l-spine obl (2 of 2)]
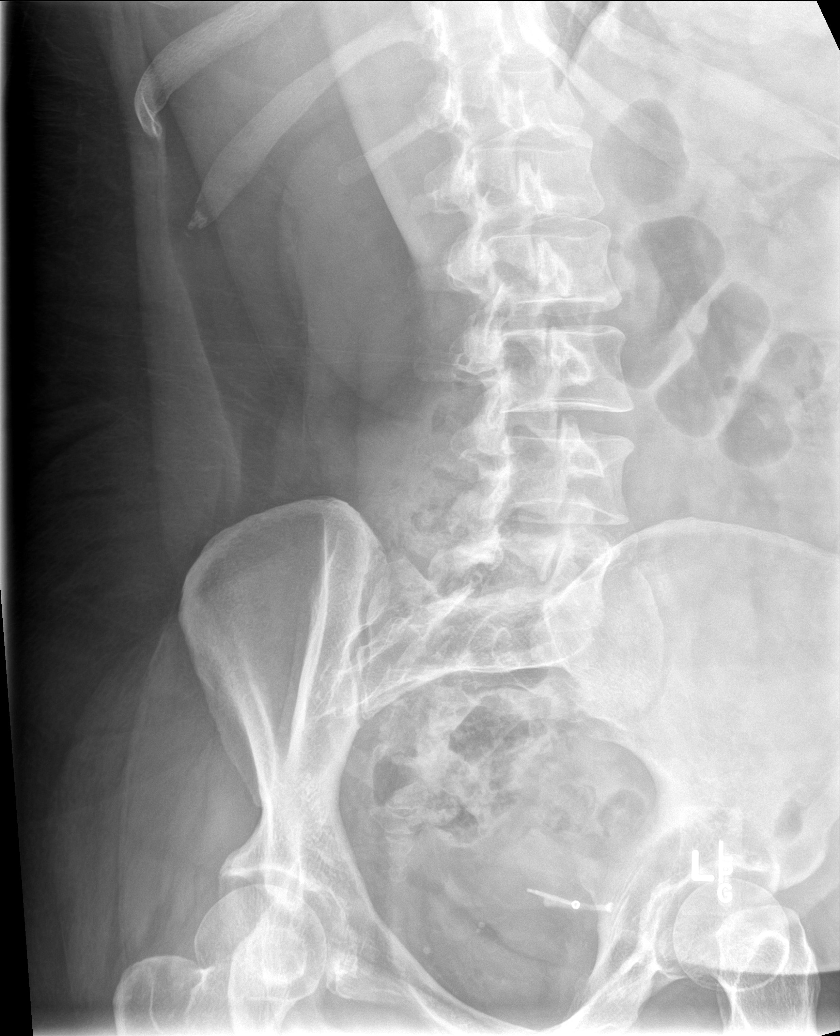

[l-spine lat]
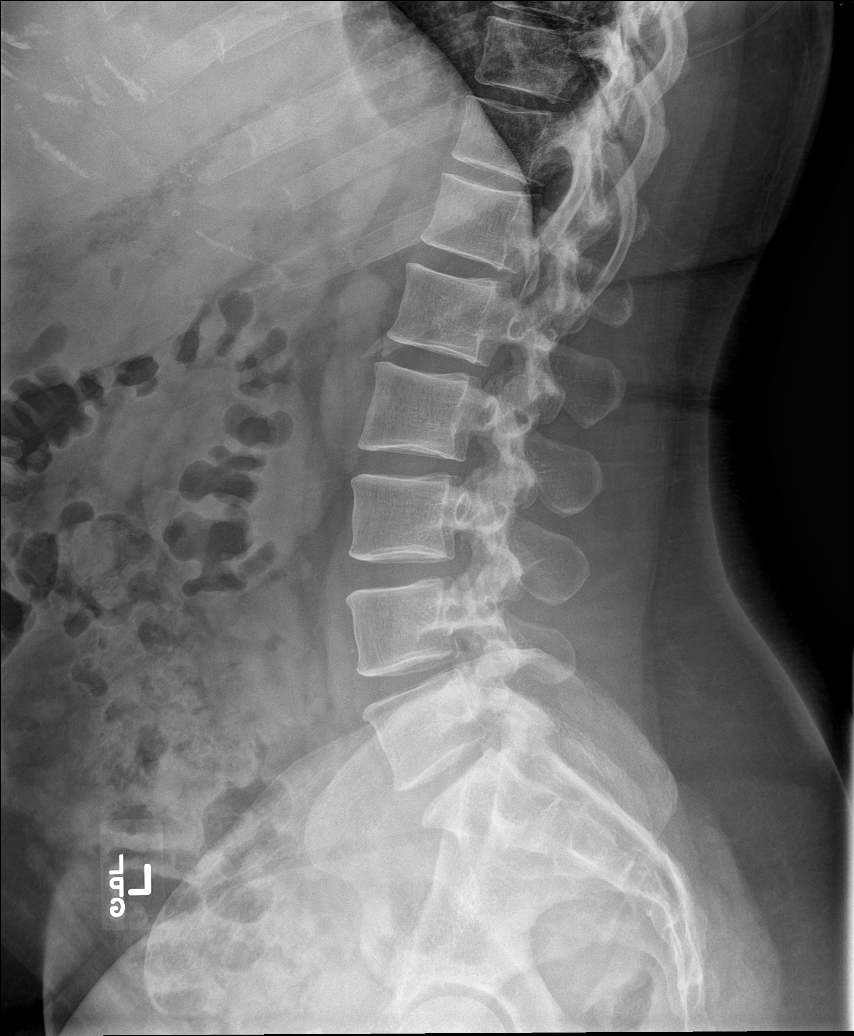

[l-spine spot]
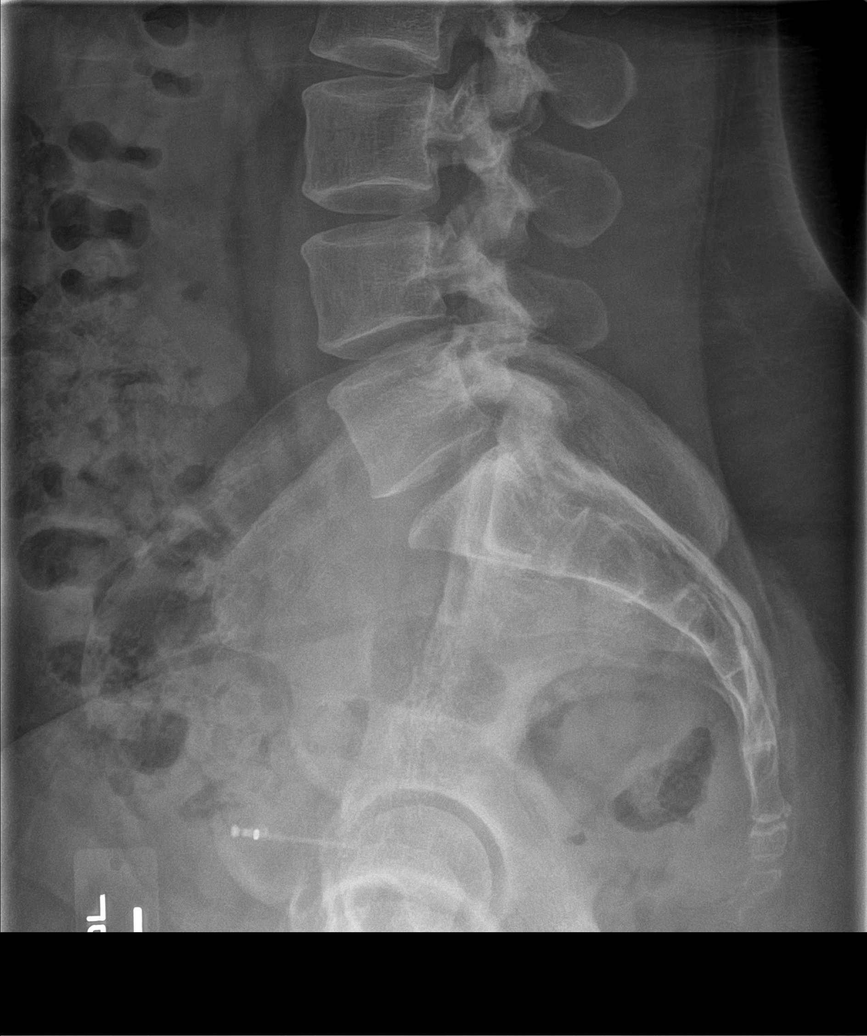

[5 of 5 positions shown; findings below may reference images not displayed]

FINDINGS: Frontal, lateral, spot lumbosacral lateral, and bilateral oblique
views were obtained. There are 5 non-rib-bearing lumbar type
vertebral bodies. T12 ribs are hypoplastic. No fracture or
spondylolisthesis. There is disc space narrowing at L4-5, a finding
not appreciable on prior study. Other disc spaces appear
unremarkable. There is no appreciable facet arthropathy.

Spina bifida occulta noted at L5, a stable congenital finding.

Intrauterine device in mid pelvis.
IMPRESSION: Moderate disc space narrowing at L4-5. Other disc spaces appear
unremarkable. No fracture or spondylolisthesis.

Intrauterine device in mid pelvis.

## 2022-11-02 ENCOUNTER — Other Ambulatory Visit: Payer: Self-pay | Admitting: Obstetrics & Gynecology

## 2022-11-02 DIAGNOSIS — O3680X0 Pregnancy with inconclusive fetal viability, not applicable or unspecified: Secondary | ICD-10-CM

## 2022-11-03 ENCOUNTER — Ambulatory Visit (INDEPENDENT_AMBULATORY_CARE_PROVIDER_SITE_OTHER): Payer: Medicaid Other

## 2022-11-03 DIAGNOSIS — Z3A09 9 weeks gestation of pregnancy: Secondary | ICD-10-CM | POA: Diagnosis not present

## 2022-11-03 DIAGNOSIS — O3680X0 Pregnancy with inconclusive fetal viability, not applicable or unspecified: Secondary | ICD-10-CM | POA: Diagnosis not present

## 2022-11-03 NOTE — Progress Notes (Signed)
[place] 9+1 wks,single IUP with yolk sac,CRL 20.67 mm,normal ovaries,FHR 177 bpm

## 2022-11-30 ENCOUNTER — Encounter: Payer: Self-pay | Admitting: Women's Health

## 2022-11-30 ENCOUNTER — Other Ambulatory Visit: Payer: Self-pay | Admitting: Obstetrics & Gynecology

## 2022-11-30 DIAGNOSIS — Z3682 Encounter for antenatal screening for nuchal translucency: Secondary | ICD-10-CM

## 2022-11-30 DIAGNOSIS — Z98891 History of uterine scar from previous surgery: Secondary | ICD-10-CM | POA: Insufficient documentation

## 2022-12-04 ENCOUNTER — Ambulatory Visit (INDEPENDENT_AMBULATORY_CARE_PROVIDER_SITE_OTHER): Payer: Medicaid Other

## 2022-12-04 ENCOUNTER — Encounter: Payer: Self-pay | Admitting: Women's Health

## 2022-12-04 ENCOUNTER — Ambulatory Visit (INDEPENDENT_AMBULATORY_CARE_PROVIDER_SITE_OTHER): Payer: Medicaid Other | Admitting: Women's Health

## 2022-12-04 ENCOUNTER — Encounter: Payer: Medicaid Other | Admitting: *Deleted

## 2022-12-04 VITALS — BP 125/69 | HR 86 | Wt 236.0 lb

## 2022-12-04 DIAGNOSIS — Z3A13 13 weeks gestation of pregnancy: Secondary | ICD-10-CM

## 2022-12-04 DIAGNOSIS — Z98891 History of uterine scar from previous surgery: Secondary | ICD-10-CM

## 2022-12-04 DIAGNOSIS — Z8632 Personal history of gestational diabetes: Secondary | ICD-10-CM | POA: Diagnosis not present

## 2022-12-04 DIAGNOSIS — Z6839 Body mass index (BMI) 39.0-39.9, adult: Secondary | ICD-10-CM | POA: Diagnosis not present

## 2022-12-04 DIAGNOSIS — Z131 Encounter for screening for diabetes mellitus: Secondary | ICD-10-CM

## 2022-12-04 DIAGNOSIS — O0991 Supervision of high risk pregnancy, unspecified, first trimester: Secondary | ICD-10-CM

## 2022-12-04 DIAGNOSIS — O0990 Supervision of high risk pregnancy, unspecified, unspecified trimester: Secondary | ICD-10-CM

## 2022-12-04 DIAGNOSIS — Z3682 Encounter for antenatal screening for nuchal translucency: Secondary | ICD-10-CM | POA: Diagnosis not present

## 2022-12-04 DIAGNOSIS — O09291 Supervision of pregnancy with other poor reproductive or obstetric history, first trimester: Secondary | ICD-10-CM

## 2022-12-04 DIAGNOSIS — I10 Essential (primary) hypertension: Secondary | ICD-10-CM

## 2022-12-04 DIAGNOSIS — Z3480 Encounter for supervision of other normal pregnancy, unspecified trimester: Secondary | ICD-10-CM

## 2022-12-04 DIAGNOSIS — O09299 Supervision of pregnancy with other poor reproductive or obstetric history, unspecified trimester: Secondary | ICD-10-CM

## 2022-12-04 MED ORDER — BLOOD PRESSURE MONITOR MISC: 0 refills | Status: DC

## 2022-12-04 MED ORDER — DOXYLAMINE-PYRIDOXINE 10-10 MG PO TBEC: DELAYED_RELEASE_TABLET | ORAL | 6 refills | Status: DC

## 2022-12-04 MED ORDER — ASPIRIN 81 MG PO TBEC: 162.0000 mg | DELAYED_RELEASE_TABLET | Freq: Every day | ORAL | 2 refills | Status: DC

## 2022-12-04 NOTE — Progress Notes (Signed)
[place] 13+4 wks,measurements c/w dates,CRL 68.67 mm,FHR 164 bpm,normal ovaries,NT 1.6 mm,NB present,posterior placenta,subchorionic hemorrhage 3.5 x 1.1 x 2.3 cm,

## 2022-12-04 NOTE — Patient Instructions (Signed)
Allison Garcia, thank you for choosing our office today! We appreciate the opportunity to meet your healthcare needs. You may receive a short survey by mail, e-mail, or through MyChart. If you are happy with your care we would appreciate if you could take just a few minutes to complete the survey questions. We read all of your comments and take your feedback very seriously. Thank you again for choosing our office.  [facility] ([address] [place], [place] [address]) Entrance C, located off of [address] Free 24/7 valet parking   Nausea & Vomiting Have saltine crackers or pretzels by your bed and eat a few bites before you raise your head out of bed in the morning Eat small frequent meals throughout the day instead of large meals Drink plenty of fluids throughout the day to stay hydrated, just don't drink a lot of fluids with your meals.  This can make your stomach fill up faster making you feel sick Do not brush your teeth right after you eat Products with real ginger are good for nausea, like ginger ale and ginger hard candy Make sure it says made with real ginger! Sucking on sour candy like lemon heads is also good for nausea If your prenatal vitamins make you nauseated, take them at night so you will sleep through the nausea Sea Bands If you feel like you need medicine for the nausea & vomiting please let us know If you are unable to keep any fluids or food down please let us know   Constipation Drink plenty of fluid, preferably water, throughout the day Eat foods high in fiber such as fruits, vegetables, and grains Exercise, such as walking, is a good way to keep your bowels regular Drink warm fluids, especially warm prune juice, or decaf coffee Eat a 1/2 cup of real oatmeal (not instant), 1/2 cup applesauce, and 1/2-1 cup warm prune juice every day If needed, you may take Colace (docusate sodium) stool softener  once or twice a day to help keep the stool soft.  If you still are having problems with constipation, you may take Miralax once daily as needed to help keep your bowels regular.   Home Blood Pressure Monitoring for Patients   Your provider has recommended that you check your blood pressure (BP) at least once a week at home. If you do not have a blood pressure cuff at home, one will be provided for you. Contact your provider if you have not received your monitor within 1 week.   Helpful Tips for Accurate Home Blood Pressure Checks  Don't smoke, exercise, or drink caffeine 30 minutes before checking your BP Use the restroom before checking your BP (a full bladder can raise your pressure) Relax in a comfortable upright chair Feet on the ground Left arm resting comfortably on a flat surface at the level of your heart Legs uncrossed Back supported Sit quietly and don't talk Place the cuff on your bare arm Adjust snuggly, so that only two fingertips can fit between your skin and the top of the cuff Check 2 readings separated by at least one minute Keep a log of your BP readings For a visual, please reference this diagram: http://ccnc.care/bpdiagram  Provider Name: [facility]     Phone: [phone]  Zone 1: ALL CLEAR  Continue to monitor your symptoms:  BP reading is less than 140 (top number) or less than 90 (bottom   number)  No right upper stomach pain No headaches or seeing spots No feeling nauseated or throwing up No swelling in face and hands  Zone 2: CAUTION Call your doctor's office for any of the following:  BP reading is greater than 140 (top number) or greater than 90 (bottom number)  Stomach pain under your ribs in the middle or right side Headaches or seeing spots Feeling nauseated or throwing up Swelling in face and hands  Zone 3: EMERGENCY  Seek immediate medical care if you have any of the following:  BP reading is greater than160 (top number) or greater than  110 (bottom number) Severe headaches not improving with Tylenol Serious difficulty catching your breath Any worsening symptoms from Zone 2    First Trimester of Pregnancy The first trimester of pregnancy is from week 1 until the end of week 12 (months 1 through 3). A week after a sperm fertilizes an egg, the egg will implant on the wall of the uterus. This embryo will begin to develop into a baby. Genes from you and your partner are forming the baby. The female genes determine whether the baby is a boy or a girl. At 6-8 weeks, the eyes and face are formed, and the heartbeat can be seen on ultrasound. At the end of 12 weeks, all the baby's organs are formed.  Now that you are pregnant, you will want to do everything you can to have a healthy baby. Two of the most important things are to get good prenatal care and to follow your health care provider's instructions. Prenatal care is all the medical care you receive before the baby's birth. This care will help prevent, find, and treat any problems during the pregnancy and childbirth. BODY CHANGES Your body goes through many changes during pregnancy. The changes vary from woman to woman.  You may gain or lose a couple of pounds at first. You may feel sick to your stomach (nauseous) and throw up (vomit). If the vomiting is uncontrollable, call your health care provider. You may tire easily. You may develop headaches that can be relieved by medicines approved by your health care provider. You may urinate more often. Painful urination may mean you have a bladder infection. You may develop heartburn as a result of your pregnancy. You may develop constipation because certain hormones are causing the muscles that push waste through your intestines to slow down. You may develop hemorrhoids or swollen, bulging veins (varicose veins). Your breasts may begin to grow larger and become tender. Your nipples may stick out more, and the tissue that surrounds them  (areola) may become darker. Your gums may bleed and may be sensitive to brushing and flossing. Dark spots or blotches (chloasma, mask of pregnancy) may develop on your face. This will likely fade after the baby is born. Your menstrual periods will stop. You may have a loss of appetite. You may develop cravings for certain kinds of food. You may have changes in your emotions from day to day, such as being excited to be pregnant or being concerned that something may go wrong with the pregnancy and baby. You may have more vivid and strange dreams. You may have changes in your hair. These can include thickening of your hair, rapid growth, and changes in texture. Some women also have hair loss during or after pregnancy, or hair that feels dry or thin. Your hair will most likely return to normal after your baby is born. WHAT TO EXPECT AT YOUR PRENATAL  VISITS During a routine prenatal visit: You will be weighed to make sure you and the baby are growing normally. Your blood pressure will be taken. Your abdomen will be measured to track your baby's growth. The fetal heartbeat will be listened to starting around week 10 or 12 of your pregnancy. Test results from any previous visits will be discussed. Your health care provider may ask you: How you are feeling. If you are feeling the baby move. If you have had any abnormal symptoms, such as leaking fluid, bleeding, severe headaches, or abdominal cramping. If you have any questions. Other tests that may be performed during your first trimester include: Blood tests to find your blood type and to check for the presence of any previous infections. They will also be used to check for low iron levels (anemia) and Rh antibodies. Later in the pregnancy, blood tests for diabetes will be done along with other tests if problems develop. Urine tests to check for infections, diabetes, or protein in the urine. An ultrasound to confirm the proper growth and development  of the baby. An amniocentesis to check for possible genetic problems. Fetal screens for spina bifida and Down syndrome. You may need other tests to make sure you and the baby are doing well. HOME CARE INSTRUCTIONS  Medicines Follow your health care provider's instructions regarding medicine use. Specific medicines may be either safe or unsafe to take during pregnancy. Take your prenatal vitamins as directed. If you develop constipation, try taking a stool softener if your health care provider approves. Diet Eat regular, well-balanced meals. Choose a variety of foods, such as meat or vegetable-based protein, fish, milk and low-fat dairy products, vegetables, fruits, and whole grain breads and cereals. Your health care provider will help you determine the amount of weight gain that is right for you. Avoid raw meat and uncooked cheese. These carry germs that can cause birth defects in the baby. Eating four or five small meals rather than three large meals a day may help relieve nausea and vomiting. If you start to feel nauseous, eating a few soda crackers can be helpful. Drinking liquids between meals instead of during meals also seems to help nausea and vomiting. If you develop constipation, eat more high-fiber foods, such as fresh vegetables or fruit and whole grains. Drink enough fluids to keep your urine clear or pale yellow. Activity and Exercise Exercise only as directed by your health care provider. Exercising will help you: Control your weight. Stay in shape. Be prepared for labor and delivery. Experiencing pain or cramping in the lower abdomen or low back is a good sign that you should stop exercising. Check with your health care provider before continuing normal exercises. Try to avoid standing for long periods of time. Move your legs often if you must stand in one place for a long time. Avoid heavy lifting. Wear low-heeled shoes, and practice good posture. You may continue to have sex  unless your health care provider directs you otherwise. Relief of Pain or Discomfort Wear a good support bra for breast tenderness.   Take warm sitz baths to soothe any pain or discomfort caused by hemorrhoids. Use hemorrhoid cream if your health care provider approves.   Rest with your legs elevated if you have leg cramps or low back pain. If you develop varicose veins in your legs, wear support hose. Elevate your feet for 15 minutes, 3-4 times a day. Limit salt in your diet. Prenatal Care Schedule your prenatal visits by the  twelfth week of pregnancy. They are usually scheduled monthly at first, then more often in the last 2 months before delivery. Write down your questions. Take them to your prenatal visits. Keep all your prenatal visits as directed by your health care provider. Safety Wear your seat belt at all times when driving. Make a list of emergency phone numbers, including numbers for family, friends, the hospital, and police and fire departments. General Tips Ask your health care provider for a referral to a local prenatal education class. Begin classes no later than at the beginning of month 6 of your pregnancy. Ask for help if you have counseling or nutritional needs during pregnancy. Your health care provider can offer advice or refer you to specialists for help with various needs. Do not use hot tubs, steam rooms, or saunas. Do not douche or use tampons or scented sanitary pads. Do not cross your legs for long periods of time. Avoid cat litter boxes and soil used by cats. These carry germs that can cause birth defects in the baby and possibly loss of the fetus by miscarriage or stillbirth. Avoid all smoking, herbs, alcohol, and medicines not prescribed by your health care provider. Chemicals in these affect the formation and growth of the baby. Schedule a dentist appointment. At home, brush your teeth with a soft toothbrush and be gentle when you floss. SEEK MEDICAL CARE IF:   You have dizziness. You have mild pelvic cramps, pelvic pressure, or nagging pain in the abdominal area. You have persistent nausea, vomiting, or diarrhea. You have a bad smelling vaginal discharge. You have pain with urination. You notice increased swelling in your face, hands, legs, or ankles. SEEK IMMEDIATE MEDICAL CARE IF:  You have a fever. You are leaking fluid from your vagina. You have spotting or bleeding from your vagina. You have severe abdominal cramping or pain. You have rapid weight gain or loss. You vomit blood or material that looks like coffee grounds. You are exposed to [place] measles and have never had them. You are exposed to fifth disease or chickenpox. You develop a severe headache. You have shortness of breath. You have any kind of trauma, such as from a fall or a car accident. Document Released: 08/15/2001 Document Revised: 01/05/2014 Document Reviewed: 07/01/2013 [facility] Patient Information 2015 [place], [place]. This information is not intended to replace advice given to you by your health care provider. Make sure you discuss any questions you have with your health care provider.   Subchorionic Hematoma  A hematoma is a collection of blood outside of the blood vessels. A subchorionic hematoma is a collection of blood between the outer wall of the embryo (chorion) and the inner wall of the uterus. This condition can cause vaginal bleeding. Early small hematomas usually shrink on their own and do not affect your baby or pregnancy. When bleeding starts later in pregnancy, or if the hematoma is larger or occurs in older pregnant women, the condition may be more serious. Larger hematomas increase the chances of miscarriage. This condition also increases the risk of: Premature separation of the placenta from the uterus. Premature (preterm) labor. Stillbirth. What are the causes? The exact cause of this condition is not known. It occurs when blood is trapped between  the placenta and the uterine wall because the placenta has separated from the original site of implantation. What increases the risk? You are more likely to develop this condition if: You were treated with fertility medicines. You became pregnant through in vitro fertilization (  IVF). What are the signs or symptoms? Symptoms of this condition include: Vaginal spotting or bleeding. Abdominal pain. This is rare. Sometimes you may have no symptoms and the bleeding may only be seen when ultrasound images are taken (transvaginal ultrasound). How is this diagnosed? This condition is diagnosed based on a physical exam. This includes a pelvic exam. You may also have other tests, including: Blood tests. Urine tests. Ultrasound of the abdomen. How is this treated? Treatment for this condition can vary. Treatment may include: Watchful waiting. You will be monitored closely for any changes in bleeding. Medicines. Activity restriction. This may be needed until the bleeding stops. A medicine called Rh immunoglobulin. This is given if you have an Rh-negative blood type. It prevents Rh sensitization. Follow these instructions at home: Stay on bed rest if told to do so by your health care provider. Do not lift anything that is heavier than 10 lb (4.5 kg), or the limit that you are told by your health care provider. Track and write down the number of pads you use each day and how soaked (saturated) they are. Do not use tampons. Keep all follow-up visits. This is important. Your health care provider may ask you to have follow-up blood tests or ultrasound tests or both. Contact a health care provider if: You have any vaginal bleeding. You have a fever. Get help right away if: You have severe cramps in your stomach, back, abdomen, or pelvis. You pass large clots or tissue. Save any tissue for your health care provider to look at. You faint. You become light-headed or weak. Summary A subchorionic  hematoma is a collection of blood between the outer wall of the embryo (chorion) and the inner wall of the uterus. This condition can cause vaginal bleeding. Sometimes you may have no symptoms and the bleeding may only be seen when ultrasound images are taken. Treatment may include watchful waiting, medicines, or activity restriction. Keep all follow-up visits. Get help right away if you have severe cramps or heavy vaginal bleeding. This information is not intended to replace advice given to you by your health care provider. Make sure you discuss any questions you have with your health care provider. Document Revised: 05/17/2020 Document Reviewed: 05/17/2020 Elsevier Patient Education  [place].

## 2022-12-04 NOTE — Progress Notes (Signed)
INITIAL OBSTETRICAL VISIT Patient name: Allison Garcia MRN [id number]  Date of birth: 1991/07/18 Chief Complaint:   Initial Prenatal Visit (nausea)  History of Present Illness:   Allison Garcia is a 32 y.o. G31P1001 Caucasian female at [redacted]w[redacted]d by LMP c/w u/s at 8 weeks with an Estimated Date of Delivery: 06/07/23 being seen today for her initial obstetrical visit.   Patient's last menstrual period was 08/31/2022. Her obstetrical history is significant for  PCS at 37.6wks after failed IOL at 9.5cm for [facility] w/ super imposed severe pre-e . Baby 6lb15oz, was OP. Wants RCS w/ salpingectomy.  H/O A1DM, has been checking some fasting sugars and are in 110s. Reports neg postpartum GTT after her last baby.   Denies h/o CHTN, states she's never been on meds, discussed this diagnosis is all over her chart in [place].  Today she reports nausea/vomiting, requests meds.  Last pap thinks it was in 2019 and was neg. Doesn't want to do today, will do next visit.      12/04/2022    9:59 AM  Depression screen PHQ 2/9  Decreased Interest 1  Down, Depressed, Hopeless 0  PHQ - 2 Score 1  Tired, decreased energy 2  Change in appetite 1  Feeling bad or failure about yourself  0  Trouble concentrating 0  Moving slowly or fidgety/restless 0  Suicidal thoughts 0        12/04/2022    9:59 AM  GAD 7 : Generalized Anxiety Score  Nervous, Anxious, on Edge 0  Control/stop worrying 0  Worry too much - different things 0  Trouble relaxing 0  Restless 1  Easily annoyed or irritable 1  Afraid - awful might happen 0  Total GAD 7 Score 2     Review of Systems:   Pertinent items are noted in HPI Denies cramping/contractions, leakage of fluid, vaginal bleeding, abnormal vaginal discharge w/ itching/odor/irritation, headaches, visual changes, shortness of breath, chest pain, abdominal pain, severe nausea/vomiting, or problems with urination or bowel movements unless otherwise stated above.  Pertinent  History Reviewed:  Reviewed past medical,surgical, social, obstetrical and family history.  Reviewed problem list, medications and allergies. OB History  Gravida Para Term Preterm AB Living  2 1 1     1   SAB IAB Ectopic Multiple Live Births          1    # Outcome Date GA Lbr Len/2nd Weight Sex Delivery Anes PTL Lv  2 Current           1 Term 05/23/18 [redacted]w[redacted]d  6 lb 15 oz (3.147 kg) F CS-LTranv EPI N LIV     Complications: Failure to Progress in First Stage, Pre-eclampsia, Gestational diabetes   Physical Assessment:   Vitals:   12/04/22 0919 12/04/22 0933  BP: 132/72 125/69  Pulse: 86   Weight: 236 lb (107 kg)   Body mass index is 43.16 kg/m.       Physical Examination:  General appearance - well appearing, and in no distress  Mental status - alert, oriented to person, place, and time  Psych:  She has a normal mood and affect  Skin - warm and dry, normal color, no suspicious lesions noted  Chest - effort normal, all lung fields clear to auscultation bilaterally  Heart - normal rate and regular rhythm  Abdomen - soft, nontender  Extremities:  No swelling or varicosities noted  Thin prep pap is not done-pt declines today- wants to do next visit  Chaperone: N/A    TODAY'S NT [place] 13+4 wks,measurements c/w dates,CRL 68.67 mm,FHR 164 bpm,normal ovaries,NT 1.6 mm,NB present,posterior placenta,subchorionic hemorrhage 3.5 x 1.1 x 2.3 cm   No results found for this or any previous visit (from the past 24 hour(s)).  Assessment & Plan:  1) High-Risk Pregnancy G2P1001 at [redacted]w[redacted]d with an Estimated Date of Delivery: 06/07/23   2) Initial OB visit  3) CHTN> per CareEverywhere, no meds currently  4) H/O severe pre-e> baseline labs today, start ASA 162mg   5) Previous c/s for failed IOL> 9.5cm, baby 6lb15oz, OP. Wants RCS w/ salpingectomy. 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 Swallow whole.    Dispense:  180 tablet    Refill:  2   Doxylamine-Pyridoxine (DICLEGIS) 10-10 MG TBEC    Sig: 2 tabs q hs, if sx persist add 1 tab q am on day 3, if sx persist add 1 tab q afternoon on day 4    Dispense:  100 tablet    Refill:  6    Initial labs obtained Continue prenatal vitamins Reviewed n/v relief measures and warning s/s to report Reviewed recommended weight gain based on pre-gravid BMI Encouraged well-balanced diet Genetic & carrier screening discussed: requests Panorama, NT/IT, and Horizon  Ultrasound discussed; fetal survey: requested CCNC completed> form faxed if has or is planning to apply for medicaid The nature of [employer] for [employer] with multiple MDs and other Advanced Practice Providers was explained to patient; also emphasized that fellows, residents, and students are part of our team. Does not have home bp cuff. Office bp cuff given: no. Rx sent: yes. Check bp weekly, let us know if consistently >140/90.   Follow-up: Return in about 3 weeks (around 12/25/2022) for HROB w/ pap, 2nd IT, MD or CNM, in person; then 6wks from now HROB, anatomy u/s, MD/CNM.   Orders Placed This Encounter  Procedures   Urine Culture   GC/Chlamydia Probe Amp   CBC/D/Plt+RPR+Rh+ABO+RubIgG...   HORIZON CUSTOM   PANORAMA PRENATAL TEST FULL PANEL   Integrated 1   Protein / creatinine ratio, urine   Comprehensive metabolic panel   Hemoglobin A1c    Roma Schanz CNM, [facility] 12/04/2022 12:20 PM

## 2022-12-05 LAB — INTEGRATED 1

## 2022-12-06 ENCOUNTER — Other Ambulatory Visit: Payer: Self-pay | Admitting: Obstetrics & Gynecology

## 2022-12-06 DIAGNOSIS — O0990 Supervision of high risk pregnancy, unspecified, unspecified trimester: Secondary | ICD-10-CM

## 2022-12-06 DIAGNOSIS — I10 Essential (primary) hypertension: Secondary | ICD-10-CM

## 2022-12-06 DIAGNOSIS — Z363 Encounter for antenatal screening for malformations: Secondary | ICD-10-CM

## 2022-12-06 LAB — URINE CULTURE

## 2022-12-06 NOTE — Progress Notes (Signed)
Orders for anatomy and serial growth

## 2022-12-07 LAB — COMPREHENSIVE METABOLIC PANEL
ALT: 19 IU/L (ref 0–32)
AST: 15 IU/L (ref 0–40)
Albumin/Globulin Ratio: 1.5 (ref 1.2–2.2)
Albumin: 4 g/dL (ref 3.9–4.9)
Alkaline Phosphatase: 46 IU/L (ref 44–121)
BUN/Creatinine Ratio: 14 (ref 9–23)
BUN: 7 mg/dL (ref 6–20)
Bilirubin Total: <0.2 mg/dL (ref 0.0–1.2)
CO2: 18 mmol/L — ABNORMAL LOW (ref 20–29)
Calcium: 9.5 mg/dL (ref 8.7–10.2)
Chloride: 105 mmol/L (ref 96–106)
Creatinine, Ser: 0.49 mg/dL — ABNORMAL LOW (ref 0.57–1.00)
Globulin, Total: 2.6 g/dL (ref 1.5–4.5)
Glucose: 117 mg/dL — ABNORMAL HIGH (ref 70–99)
Potassium: 3.7 mmol/L (ref 3.5–5.2)
Sodium: 137 mmol/L (ref 134–144)
Total Protein: 6.6 g/dL (ref 6.0–8.5)
eGFR: 128 mL/min/{1.73_m2} (ref >59)

## 2022-12-07 LAB — CBC/D/PLT+RPR+RH+ABO+RUBIGG...
Antibody Screen: NEGATIVE
Basophils Absolute: 0 10*3/uL (ref 0.0–0.2)
Basos: 0 %
EOS (ABSOLUTE): 0 10*3/uL (ref 0.0–0.4)
Eos: 0 %
HCV Ab: NONREACTIVE
HIV Screen 4th Generation wRfx: NONREACTIVE
Hematocrit: 42.3 % (ref 34.0–46.6)
Hemoglobin: 14.3 g/dL (ref 11.1–15.9)
Hepatitis B Surface Ag: NEGATIVE
Immature Grans (Abs): 0.1 10*3/uL (ref 0.0–0.1)
Immature Granulocytes: 1 %
Lymphocytes Absolute: 2.8 10*3/uL (ref 0.7–3.1)
Lymphs: 24 %
MCH: 30.7 pg (ref 26.6–33.0)
MCHC: 33.8 g/dL (ref 31.5–35.7)
MCV: 91 fL (ref 79–97)
Monocytes Absolute: 0.7 10*3/uL (ref 0.1–0.9)
Monocytes: 6 %
Neutrophils Absolute: 8 10*3/uL — ABNORMAL HIGH (ref 1.4–7.0)
Neutrophils: 69 %
Platelets: 266 10*3/uL (ref 150–450)
RBC: 4.66 x10E6/uL (ref 3.77–5.28)
RDW: 12.9 % (ref 11.7–15.4)
RPR Ser Ql: NONREACTIVE
Rh Factor: POSITIVE
Rubella Antibodies, IGG: 1.25 index (ref >0.99)
WBC: 11.5 10*3/uL — ABNORMAL HIGH (ref 3.4–10.8)

## 2022-12-07 LAB — INTEGRATED 1
Crown Rump Length: 68.7 mm
Gest. Age on Collection Date: 12.9 weeks
Maternal Age at EDD: 34.3 yr
Nuchal Translucency (NT): 1.6 mm
Number of Fetuses: 1
PAPP-A Value: 679.1 ng/mL

## 2022-12-07 LAB — PROTEIN / CREATININE RATIO, URINE
Creatinine, Urine: 50.4 mg/dL
Protein, Ur: 10.4 mg/dL
Protein/Creat Ratio: 206 mg/g creat — ABNORMAL HIGH (ref 0–200)

## 2022-12-07 LAB — HEMOGLOBIN A1C
Est. average glucose Bld gHb Est-mCnc: 111 mg/dL
Hgb A1c MFr Bld: 5.5 % (ref 4.8–5.6)

## 2022-12-07 LAB — GC/CHLAMYDIA PROBE AMP
Chlamydia trachomatis, NAA: NEGATIVE
Neisseria Gonorrhoeae by PCR: NEGATIVE

## 2022-12-07 LAB — HCV INTERPRETATION

## 2022-12-08 ENCOUNTER — Encounter: Payer: Self-pay | Admitting: Women's Health

## 2022-12-12 LAB — HORIZON CUSTOM: REPORT SUMMARY: NEGATIVE

## 2022-12-13 LAB — PANORAMA PRENATAL TEST FULL PANEL:PANORAMA TEST PLUS 5 ADDITIONAL MICRODELETIONS: FETAL FRACTION: 4.4

## 2022-12-15 ENCOUNTER — Encounter: Payer: Self-pay | Admitting: Women's Health

## 2022-12-19 DIAGNOSIS — Z3480 Encounter for supervision of other normal pregnancy, unspecified trimester: Secondary | ICD-10-CM | POA: Diagnosis not present

## 2022-12-26 ENCOUNTER — Ambulatory Visit (INDEPENDENT_AMBULATORY_CARE_PROVIDER_SITE_OTHER): Payer: Medicaid Other | Admitting: Women's Health

## 2022-12-26 ENCOUNTER — Other Ambulatory Visit (HOSPITAL_COMMUNITY)
Admission: RE | Admit: 2022-12-26 | Discharge: 2022-12-26 | Disposition: A | Discharge status: Home / Self Care | Payer: Medicaid Other | Source: Ambulatory Visit | Attending: Women's Health | Admitting: Women's Health

## 2022-12-26 ENCOUNTER — Encounter: Payer: Self-pay | Admitting: Women's Health

## 2022-12-26 VITALS — BP 134/80 | HR 85 | Wt 241.0 lb

## 2022-12-26 DIAGNOSIS — O0990 Supervision of high risk pregnancy, unspecified, unspecified trimester: Secondary | ICD-10-CM

## 2022-12-26 DIAGNOSIS — Z124 Encounter for screening for malignant neoplasm of cervix: Secondary | ICD-10-CM | POA: Diagnosis not present

## 2022-12-26 DIAGNOSIS — Z1379 Encounter for other screening for genetic and chromosomal anomalies: Secondary | ICD-10-CM | POA: Diagnosis not present

## 2022-12-26 DIAGNOSIS — Z1389 Encounter for screening for other disorder: Secondary | ICD-10-CM

## 2022-12-26 DIAGNOSIS — Z363 Encounter for antenatal screening for malformations: Secondary | ICD-10-CM

## 2022-12-26 DIAGNOSIS — Z3A16 16 weeks gestation of pregnancy: Secondary | ICD-10-CM

## 2022-12-26 DIAGNOSIS — Z331 Pregnant state, incidental: Secondary | ICD-10-CM

## 2022-12-26 DIAGNOSIS — O0992 Supervision of high risk pregnancy, unspecified, second trimester: Secondary | ICD-10-CM

## 2022-12-26 DIAGNOSIS — I10 Essential (primary) hypertension: Secondary | ICD-10-CM

## 2022-12-26 LAB — POCT URINALYSIS DIPSTICK OB
Blood, UA: NEGATIVE
Glucose, UA: NEGATIVE
Ketones, UA: NEGATIVE
Leukocytes, UA: NEGATIVE
Nitrite, UA: NEGATIVE
POC,PROTEIN,UA: NEGATIVE

## 2022-12-26 NOTE — Progress Notes (Signed)
HIGH-RISK PREGNANCY VISIT Patient name: Allison Garcia MRN [id number]  Date of birth: [birth year] Chief Complaint:   Routine Prenatal Visit (Pap/ 2ndIT)  History of Present Illness:   Allison Garcia is a 32 y.o. G71P1001 female at [redacted]w[redacted]d with an Estimated Date of Delivery: 06/07/23 being seen today for ongoing management of a high-risk pregnancy complicated by chronic hypertension currently on no meds.    Today she reports  concerned about weight gain, 26lb so far . Contractions: Not present.  .  Movement: Present. denies leaking of fluid.      12/04/2022    9:59 AM  Depression screen PHQ 2/9  Decreased Interest 1  Down, Depressed, Hopeless 0  PHQ - 2 Score 1  Tired, decreased energy 2  Change in appetite 1  Feeling bad or failure about yourself  0  Trouble concentrating 0  Moving slowly or fidgety/restless 0  Suicidal thoughts 0        12/04/2022    9:59 AM  GAD 7 : Generalized Anxiety Score  Nervous, Anxious, on Edge 0  Control/stop worrying 0  Worry too much - different things 0  Trouble relaxing 0  Restless 1  Easily annoyed or irritable 1  Afraid - awful might happen 0  Total GAD 7 Score 2     Review of Systems:   Pertinent items are noted in HPI Denies abnormal vaginal discharge w/ itching/odor/irritation, headaches, visual changes, shortness of breath, chest pain, abdominal pain, severe nausea/vomiting, or problems with urination or bowel movements unless otherwise stated above. Pertinent History Reviewed:  Reviewed past medical,surgical, social, obstetrical and family history.  Reviewed problem list, medications and allergies. Physical Assessment:   Vitals:   12/26/22 0909  BP: 134/80  Pulse: 85  Weight: 241 lb (109.3 kg)  Body mass index is 44.08 kg/m.           Physical Examination:   General appearance: alert, well appearing, and in no distress  Mental status: alert, oriented to person, place, and time  Skin: warm & dry   Extremities: Edema: Trace     Cardiovascular: normal heart rate noted  Respiratory: normal respiratory effort, no distress  Abdomen: gravid, soft, non-tender  Pelvic:  thin prep pap obtained          Fetal Status: Fetal Heart Rate (bpm): 152   Movement: Present    Fetal Surveillance Testing today: doppler   Chaperone: Federico Flake    Results for orders placed or performed in visit on 12/26/22 (from the past 24 hour(s))  POC Urinalysis Dipstick OB   Collection Time: 12/26/22  9:54 AM  Result Value Ref Range   Color, UA     Clarity, UA     Glucose, UA Negative Negative   Bilirubin, UA     Ketones, UA negative    Spec Grav, UA     Blood, UA negative    pH, UA     POC,PROTEIN,UA Negative Negative, Trace, Small (1+), Moderate (2+), Large (3+), 4+   Urobilinogen, UA     Nitrite, UA negative    Leukocytes, UA Negative Negative   Appearance     Odor      Assessment & Plan:  High-risk pregnancy: G2P1001 at [redacted]w[redacted]d with an Estimated Date of Delivery: 06/07/23   1) CHTN, no meds, ASA daily  2) Prev c/s, for RCS  3) Cervical cancer screening> pap today  4) Excessive weight gain> reviewed decreasing carbs, increasing protein, water as main source of po  hydration, increasing activity  Meds: No orders of the defined types were placed in this encounter.   Labs/procedures today: pap and 2nd IT  Treatment Plan:   [place] 24, 28, 32, 36wks     no antenatal testing if bp remains <140/90 and no meds     Deliver @ 38-39.6wks:______   Reviewed: Preterm labor symptoms and general obstetric precautions including but not limited to vaginal bleeding, contractions, leaking of fluid and fetal movement were reviewed in detail with the patient.  All questions were answered. Does have home bp cuff. Office bp cuff given: not applicable. Check bp weekly, let us know if consistently >140 and/or >90.  Follow-up: Return for As scheduled; then 4wks later for EFW u/s and HROB MD/CNM.   Future Appointments  Date Time Provider Department  Center  01/17/2023  8:30 AM [facility] [place] [facility] None  01/17/2023  9:30 AM Myna Hidalgo, DO [facility]  02/21/2023  8:30 AM [facility] [place] [facility] None  02/21/2023  9:30 AM Myna Hidalgo, DO [facility]     Orders Placed This Encounter  Procedures   INTEGRATED 2   POC Urinalysis Dipstick OB   Cheral Marker CNM, [facility] 12/26/2022 10:03 AM

## 2022-12-26 NOTE — Patient Instructions (Signed)
[place], thank you for choosing our office today! We appreciate the opportunity to meet your healthcare needs. You may receive a short survey by mail, e-mail, or through MyChart. If you are happy with your care we would appreciate if you could take just a few minutes to complete the survey questions. We read all of your comments and take your feedback very seriously. Thank you again for choosing our office.  [facility] Team at [facility] ([address] [place], [place] [address]) Entrance C, located off of [address] Free 24/7 valet parking  Go to [email or web address] to register for FREE online childbirth classes  Call the office ([phone]) or go to [facility] if: You begin to severe cramping Your water breaks.  Sometimes it is a big gush of fluid, sometimes it is just a trickle that keeps getting your panties wet or running down your legs You have vaginal bleeding.  It is normal to have a small amount of spotting if your cervix was checked.   [place] Pediatricians/Family Doctors [facility] ([facility]): [address], [phone]           [facility]: [address], [phone]                [facility] ([facility]): [address], [phone] (call to ask if accepting patients) [facility]: [address], [place], [phone]    [place] Pediatricians/Family Doctors [facility] ([facility]): [address], [phone] [facility]: [address], [phone] [facility]: [address], [phone]  [place] [facility]  [facility] ([facility]): [phone] [facility]: [address], [phone]   [place] Family Doctors [facility]: [address], [phone]  [place] [facility]  [facility]: [address], [phone]  Home Blood Pressure Monitoring for Patients   Your provider has recommended that you check your blood pressure (BP) at least once a week at home. If you do not have a blood pressure cuff at home, one will be provided for you. Contact your provider if you have not received your monitor within 1 week.   Helpful Tips for Accurate Home Blood Pressure Checks  Don't smoke, exercise, or drink caffeine 30 minutes before checking your BP Use the restroom before checking your BP (a full bladder can raise your pressure) Relax in a comfortable upright chair Feet on the ground Left arm resting comfortably on a flat surface at the level of your heart Legs uncrossed Back supported Sit quietly and don't talk Place the cuff on your bare arm Adjust snuggly, so that only two fingertips can fit between your skin and the top of the cuff Check 2 readings separated by at least one minute Keep a log of your BP readings For a visual, please reference this diagram: http://[facility].care/bpdiagram  Provider Name: [facility]     Phone: [phone]  Zone 1: ALL CLEAR  Continue to monitor your symptoms:  BP reading is less than 140 (top number) or less than 90 (bottom number)  No right upper stomach pain No headaches or seeing spots No feeling nauseated or throwing up No swelling in face and hands  Zone 2: CAUTION Call your doctor's office for any of the following:  BP reading is greater than 140 (top number) or greater than   90 (bottom number)  Stomach pain under your ribs in the middle or right side Headaches or seeing spots Feeling nauseated or throwing up Swelling in face and hands  Zone 3: EMERGENCY  Seek immediate medical care if you have any of the following:  BP reading is greater than160 (top number) or greater than 110 (bottom number) Severe headaches not improving with Tylenol Serious difficulty catching your breath Any worsening symptoms from  Zone 2     Second Trimester of Pregnancy The second trimester is from week 14 through week 27 (months 4 through 6). The second trimester is often a time when you feel your best. Your body has adjusted to being pregnant, and you begin to feel better physically. Usually, morning sickness has lessened or quit completely, you may have more energy, and you may have an increase in appetite. The second trimester is also a time when the fetus is growing rapidly. At the end of the sixth month, the fetus is about 9 inches long and weighs about 1 pounds. You will likely begin to feel the baby move (quickening) between 16 and 20 weeks of pregnancy. Body changes during your second trimester Your body continues to go through many changes during your second trimester. The changes vary from woman to woman. Your weight will continue to increase. You will notice your lower abdomen bulging out. You may begin to get stretch marks on your hips, abdomen, and breasts. You may develop headaches that can be relieved by medicines. The medicines should be approved by your health care provider. You may urinate more often because the fetus is pressing on your bladder. You may develop or continue to have heartburn as a result of your pregnancy. You may develop constipation because certain hormones are causing the muscles that push waste through your intestines to slow down. You may develop hemorrhoids or swollen, bulging veins (varicose veins). You may have back pain. This is caused by: Weight gain. Pregnancy hormones that are relaxing the joints in your pelvis. A shift in weight and the muscles that support your balance. Your breasts will continue to grow and they will continue to become tender. Your gums may bleed and may be sensitive to brushing and flossing. Dark spots or blotches (chloasma, mask of pregnancy) may develop on your face. This will likely fade after the baby is born. A dark line from your belly button to  the pubic area (linea nigra) may appear. This will likely fade after the baby is born. You may have changes in your hair. These can include thickening of your hair, rapid growth, and changes in texture. Some women also have hair loss during or after pregnancy, or hair that feels dry or thin. Your hair will most likely return to normal after your baby is born.  What to expect at prenatal visits During a routine prenatal visit: You will be weighed to make sure you and the fetus are growing normally. Your blood pressure will be taken. Your abdomen will be measured to track your baby's growth. The fetal heartbeat will be listened to. Any test results from the previous visit will be discussed.  Your health care provider may ask you: How you are feeling. If you are feeling the baby move. If you have had any abnormal symptoms, such as leaking fluid, bleeding, severe headaches, or abdominal cramping. If you are using any tobacco products, including cigarettes, chewing tobacco, and electronic cigarettes. If you have any questions.  Other tests that may be performed during   your second trimester include: Blood tests that check for: Low iron levels (anemia). High blood sugar that affects pregnant women (gestational diabetes) between 24 and 28 weeks. Rh antibodies. This is to check for a protein on red blood cells (Rh factor). Urine tests to check for infections, diabetes, or protein in the urine. An ultrasound to confirm the proper growth and development of the baby. An amniocentesis to check for possible genetic problems. Fetal screens for spina bifida and Down syndrome. HIV (human immunodeficiency virus) testing. Routine prenatal testing includes screening for HIV, unless you choose not to have this test.  Follow these instructions at home: Medicines Follow your health care provider's instructions regarding medicine use. Specific medicines may be either safe or unsafe to take during  pregnancy. Take a prenatal vitamin that contains at least 600 micrograms (mcg) of folic acid. If you develop constipation, try taking a stool softener if your health care provider approves. Eating and drinking Eat a balanced diet that includes fresh fruits and vegetables, whole grains, good sources of protein such as meat, eggs, or tofu, and low-fat dairy. Your health care provider will help you determine the amount of weight gain that is right for you. Avoid raw meat and uncooked cheese. These carry germs that can cause birth defects in the baby. If you have low calcium intake from food, talk to your health care provider about whether you should take a daily calcium supplement. Limit foods that are high in fat and processed sugars, such as fried and sweet foods. To prevent constipation: Drink enough fluid to keep your urine clear or pale yellow. Eat foods that are high in fiber, such as fresh fruits and vegetables, whole grains, and beans. Activity Exercise only as directed by your health care provider. Most women can continue their usual exercise routine during pregnancy. Try to exercise for 30 minutes at least 5 days a week. Stop exercising if you experience uterine contractions. Avoid heavy lifting, wear low heel shoes, and practice good posture. A sexual relationship may be continued unless your health care provider directs you otherwise. Relieving pain and discomfort Wear a good support bra to prevent discomfort from breast tenderness. Take warm sitz baths to soothe any pain or discomfort caused by hemorrhoids. Use hemorrhoid cream if your health care provider approves. Rest with your legs elevated if you have leg cramps or low back pain. If you develop varicose veins, wear support hose. Elevate your feet for 15 minutes, 3-4 times a day. Limit salt in your diet. Prenatal Care Write down your questions. Take them to your prenatal visits. Keep all your prenatal visits as told by your health  care provider. This is important. Safety Wear your seat belt at all times when driving. Make a list of emergency phone numbers, including numbers for family, friends, the hospital, and police and fire departments. General instructions Ask your health care provider for a referral to a local prenatal education class. Begin classes no later than the beginning of month 6 of your pregnancy. Ask for help if you have counseling or nutritional needs during pregnancy. Your health care provider can offer advice or refer you to specialists for help with various needs. Do not use hot tubs, steam rooms, or saunas. Do not douche or use tampons or scented sanitary pads. Do not cross your legs for long periods of time. Avoid cat litter boxes and soil used by cats. These carry germs that can cause birth defects in the baby and possibly loss of the   fetus by miscarriage or stillbirth. Avoid all smoking, herbs, alcohol, and unprescribed drugs. Chemicals in these products can affect the formation and growth of the baby. Do not use any products that contain nicotine or tobacco, such as cigarettes and e-cigarettes. If you need help quitting, ask your health care provider. Visit your dentist if you have not gone yet during your pregnancy. Use a soft toothbrush to brush your teeth and be gentle when you floss. Contact a health care provider if: You have dizziness. You have mild pelvic cramps, pelvic pressure, or nagging pain in the abdominal area. You have persistent nausea, vomiting, or diarrhea. You have a bad smelling vaginal discharge. You have pain when you urinate. Get help right away if: You have a fever. You are leaking fluid from your vagina. You have spotting or bleeding from your vagina. You have severe abdominal cramping or pain. You have rapid weight gain or weight loss. You have shortness of breath with chest pain. You notice sudden or extreme swelling of your face, hands, ankles, feet, or legs. You  have not felt your baby move in over an hour. You have severe headaches that do not go away when you take medicine. You have vision changes. Summary The second trimester is from week 14 through week 27 (months 4 through 6). It is also a time when the fetus is growing rapidly. Your body goes through many changes during pregnancy. The changes vary from woman to woman. Avoid all smoking, herbs, alcohol, and unprescribed drugs. These chemicals affect the formation and growth your baby. Do not use any tobacco products, such as cigarettes, chewing tobacco, and e-cigarettes. If you need help quitting, ask your health care provider. Contact your health care provider if you have any questions. Keep all prenatal visits as told by your health care provider. This is important. This information is not intended to replace advice given to you by your health care provider. Make sure you discuss any questions you have with your health care provider. Document Released: 08/15/2001 Document Revised: 01/27/2016 Document Reviewed: 10/22/2012 Elsevier Interactive Patient Education  2017 [employer]  

## 2022-12-29 LAB — CYTOLOGY - PAP
Comment: NEGATIVE
Diagnosis: NEGATIVE
High risk HPV: NEGATIVE

## 2023-01-01 MED ORDER — METRONIDAZOLE 500 MG PO TABS: 500.0000 mg | ORAL_TABLET | Freq: Two times a day (BID) | ORAL | 0 refills | Status: DC

## 2023-01-01 NOTE — Addendum Note (Signed)
Addended by: Cheral Marker on: 01/01/2023 01:19 PM   Modules accepted: Orders

## 2023-01-02 ENCOUNTER — Ambulatory Visit
Admission: EM | Admit: 2023-01-02 | Discharge: 2023-01-02 | Disposition: A | Discharge status: Home / Self Care | Payer: Medicaid Other | Attending: Family Medicine | Admitting: Family Medicine

## 2023-01-02 ENCOUNTER — Other Ambulatory Visit: Payer: Self-pay

## 2023-01-02 ENCOUNTER — Encounter: Payer: Self-pay | Admitting: Emergency Medicine

## 2023-01-02 DIAGNOSIS — Z3A18 18 weeks gestation of pregnancy: Secondary | ICD-10-CM

## 2023-01-02 DIAGNOSIS — O26812 Pregnancy related exhaustion and fatigue, second trimester: Secondary | ICD-10-CM | POA: Insufficient documentation

## 2023-01-02 DIAGNOSIS — R0981 Nasal congestion: Secondary | ICD-10-CM | POA: Diagnosis not present

## 2023-01-02 DIAGNOSIS — O169 Unspecified maternal hypertension, unspecified trimester: Secondary | ICD-10-CM

## 2023-01-02 DIAGNOSIS — J0100 Acute maxillary sinusitis, unspecified: Secondary | ICD-10-CM

## 2023-01-02 LAB — POCT URINALYSIS DIP (MANUAL ENTRY)
Bilirubin, UA: NEGATIVE
Blood, UA: NEGATIVE
Glucose, UA: NEGATIVE mg/dL
Ketones, POC UA: NEGATIVE mg/dL
Nitrite, UA: NEGATIVE
Protein Ur, POC: NEGATIVE mg/dL
Spec Grav, UA: 1.01 (ref 1.010–1.025)
Urobilinogen, UA: 0.2 E.U./dL
pH, UA: 6.5 (ref 5.0–8.0)

## 2023-01-02 MED ORDER — AMOXICILLIN 875 MG PO TABS: 875.0000 mg | ORAL_TABLET | Freq: Two times a day (BID) | ORAL | 0 refills | Status: AC

## 2023-01-02 NOTE — Discharge Instructions (Signed)
You have had labs (urine culture) sent today. We will call you with any significant abnormalities or if there is need to begin or change treatment or pursue further follow up.  Call your OB to arrange follow up concerning your elevated blood pressure and leg swelling.

## 2023-01-02 NOTE — ED Notes (Addendum)
Pt not in waiting room. Attempted to call x1 per number in chart, no answer.

## 2023-01-02 NOTE — ED Triage Notes (Signed)
Pt reports is [redacted] weeks pregnant. Pt reports nasal congestion, headache, increased BLE swelling, fatigue for last several days. Pt reports today had episode earlier where "felt like was going to pass out." Pt reports has felt fetal movement.

## 2023-01-03 ENCOUNTER — Encounter: Payer: Self-pay | Admitting: *Deleted

## 2023-01-03 ENCOUNTER — Ambulatory Visit (INDEPENDENT_AMBULATORY_CARE_PROVIDER_SITE_OTHER): Payer: Medicaid Other | Admitting: *Deleted

## 2023-01-03 VITALS — BP 136/87 | HR 84 | Ht 61.0 in | Wt 244.0 lb

## 2023-01-03 DIAGNOSIS — Z98891 History of uterine scar from previous surgery: Secondary | ICD-10-CM

## 2023-01-03 DIAGNOSIS — O0990 Supervision of high risk pregnancy, unspecified, unspecified trimester: Secondary | ICD-10-CM

## 2023-01-03 LAB — INTEGRATED 2
AFP MoM: 0.83
Alpha-Fetoprotein: 26.3 ng/mL
Crown Rump Length: 68.7 mm
DIA MoM: 1.21
DIA Value: 188.8 pg/mL
Estriol, Unconjugated: 0.68 ng/mL
Gest. Age on Collection Date: 12.9 weeks
Gestational Age: 16 weeks
Maternal Age at EDD: 34.3 yr
Nuchal Translucency (NT): 1.6 mm
Nuchal Translucency MoM: 1.03
Number of Fetuses: 1
PAPP-A MoM: 0.67
PAPP-A Value: 679.1 ng/mL
Test Results:: NEGATIVE
hCG MoM: 1.07
hCG Value: 40.3 IU/mL
uE3 MoM: 0.72

## 2023-01-03 LAB — URINE CULTURE: Culture: NO GROWTH

## 2023-01-03 NOTE — Progress Notes (Signed)
   NURSE VISIT- BLOOD PRESSURE CHECK  SUBJECTIVE:  Allison Garcia is a 32 y.o. G24P1001 female here for BP check. She is [redacted]w[redacted]d pregnant    HYPERTENSION ROS:  Pregnant/postpartum:  Severe headaches that don't go away with tylenol/other medicines: Yes  Visual changes (seeing spots/double/blurred vision) No  Severe pain under right breast breast or in center of upper chest No  Severe nausea/vomiting No  Taking medicines as instructed not applicable    OBJECTIVE:  BP 136/87   Pulse 84   Ht 5\' 1"  (1.549 m)   Wt 244 lb (110.7 kg)   LMP 08/31/2022   BMI 46.10 kg/m   Appearance alert, well appearing, and in no distress.  ASSESSMENT: Pregnancy [redacted]w[redacted]d  blood pressure check  PLAN: Discussed with Dr. Charlotta Newton   Recommendations: no changes needed   Follow-up: as scheduled   Malachy Mood  01/03/2023 5:02 PM

## 2023-01-03 NOTE — ED Provider Notes (Signed)
[facility]   [id number] 01/02/23 Arrival Time: 1745  ASSESSMENT & PLAN:  1. Nasal congestion   2. Elevated blood pressure complicating pregnancy, antepartum   3. Acute non-recurrent maxillary sinusitis   4. Pregnancy related fatigue in second trimester    Labs Reviewed  POCT URINALYSIS DIP (MANUAL ENTRY) - Abnormal; Notable for the following components:      Result Value   Color, UA light yellow (*)    Leukocytes, UA Small (1+) (*)    All other components within normal limits  URINE CULTURE  Urine culture sent.  H/O pre-eclampsia.    Discharge Instructions      You have had labs (urine culture) sent today. We will call you with any significant abnormalities or if there is need to begin or change treatment or pursue further follow up.  Call your OB to arrange follow up concerning your elevated blood pressure and leg swelling.     Begin: Meds ordered this encounter  Medications   amoxicillin (AMOXIL) 875 MG tablet    Sig: Take 1 tablet (875 mg total) by mouth 2 (two) times daily for 10 days.    Dispense:  20 tablet    Refill:  0    Discussed typical duration of symptoms. OTC symptom care as needed. Ensure adequate fluid intake and rest.   Reviewed expectations re: course of current medical issues. Questions answered. Outlined signs and symptoms indicating need for more acute intervention. Patient verbalized understanding. After Visit Summary given.   SUBJECTIVE: History from: patient. Allison Garcia is a 32 y.o. female who presents with complaint of nasal congestion, post-nasal drainage, and sinus pain; with assoc mild HA and fatigue. [redacted] weeks pregnant. Mild increased lower extremity edema over past week of so. Questions if hands swell at times. Today reports "feeling like I was going to pass out"; short-lived; few seconds; has not recurred. Denies CP/SOB. Normal fetal movement. Denies urinary symptoms.  Social History   Tobacco Use  Smoking  Status Former   Packs/day: 1.00   Years: 9.00   Additional pack years: 0.00   Total pack years: 9.00   Types: Cigarettes  Smokeless Tobacco Never   Increased blood pressure noted today. Reports that she is not treated for HTN. Pre-eclampsia with last pregnancy.  OBJECTIVE:  Vitals:   01/02/23 1804  BP: (!) 153/92  Pulse: 97  Resp: 20  Temp: 98.9 F (37.2 C)  TempSrc: Oral  SpO2: 96%     General appearance: alert; no distress HEENT: nasal congestion; clear runny nose; throat irritation secondary to post-nasal drainage; bilateral maxillary tenderness to palpation; turbinates boggy Neck: supple without LAD; trachea midline Lungs: unlabored respirations, symmetrical air entry; cough: absent; no respiratory distress Abd: benign Skin: warm and dry Psychological: alert and cooperative; normal mood and affect  Allergies  Allergen Reactions   Other Anaphylaxis and Shortness Of Breath    Bleach      Past Medical History:  Diagnosis Date   Abnormal Pap smear of cervix    Back pain    GERD (gastroesophageal reflux disease) 03/22/2020   Gestational diabetes    History of stomach ulcers    told by ED. no EGD   Preeclampsia    Family History  Problem Relation Age of Onset   Gallbladder disease Mother    Torticollis Daughter    Hypertension Maternal Grandmother    COPD Other    Colon cancer Neg Hx    Colon polyps Neg Hx    Social  History   Socioeconomic History   Marital status: Single    Spouse name: Not on file   Number of children: Not on file   Years of education: Not on file   Highest education level: Not on file  Occupational History   Occupation: Nurse Tech    Comment: [employer]  Tobacco Use   Smoking status: Former    Packs/day: 1.00    Years: 9.00    Additional pack years: 0.00    Total pack years: 9.00    Types: Cigarettes   Smokeless tobacco: Never  Vaping Use   Vaping Use: Former  Substance and Sexual Activity   Alcohol use: Not  Currently    Comment: occas   Drug use: No   Sexual activity: Yes    Birth control/protection: None  Other Topics Concern   Not on file  Social History Narrative   Not on file   Social Determinants of Health   Financial Resource Strain: Low Risk  (12/04/2022)   Overall Financial Resource Strain (CARDIA)    Difficulty of Paying Living Expenses: Not very hard  Food Insecurity: No Food Insecurity (12/04/2022)   Hunger Vital Sign    Worried About Running Out of Food in the Last Year: Never true    Ran Out of Food in the Last Year: Never true  Transportation Needs: No Transportation Needs (12/04/2022)   PRAPARE - Administrator, Civil Service (Medical): No    Lack of Transportation (Non-Medical): No  Physical Activity: Inactive (12/04/2022)   Exercise Vital Sign    Days of Exercise per Week: 5 days    Minutes of Exercise per Session: 0 min  Stress: Stress Concern Present (12/04/2022)   [employer] of Occupational Health - Occupational Stress Questionnaire    Feeling of Stress : Very much  Social Connections: Moderately Isolated (12/04/2022)   Social Connection and Isolation Panel [NHANES]    Frequency of Communication with Friends and Family: Twice a week    Frequency of Social Gatherings with Friends and Family: Once a week    Attends Religious Services: 1 to 4 times per year    Active Member of [employer] or Organizations: No    Attends Banker Meetings: Never    Marital Status: Divorced  Catering manager Violence: Not At Risk (12/04/2022)   Humiliation, Afraid, Rape, and Kick questionnaire    Fear of Current or Ex-Partner: No    Emotionally Abused: No    Physically Abused: No    Sexually Abused: No             Mardella Layman, MD 01/03/23 1001

## 2023-01-08 ENCOUNTER — Encounter: Payer: Self-pay | Admitting: Women's Health

## 2023-01-10 ENCOUNTER — Encounter: Payer: Self-pay | Admitting: Obstetrics & Gynecology

## 2023-01-10 ENCOUNTER — Other Ambulatory Visit: Payer: Self-pay | Admitting: Advanced Practice Midwife

## 2023-01-11 ENCOUNTER — Other Ambulatory Visit: Payer: Self-pay | Admitting: *Deleted

## 2023-01-11 DIAGNOSIS — O26899 Other specified pregnancy related conditions, unspecified trimester: Secondary | ICD-10-CM

## 2023-01-17 ENCOUNTER — Ambulatory Visit (INDEPENDENT_AMBULATORY_CARE_PROVIDER_SITE_OTHER): Payer: Medicaid Other | Admitting: Obstetrics & Gynecology

## 2023-01-17 ENCOUNTER — Encounter: Payer: Self-pay | Admitting: Obstetrics & Gynecology

## 2023-01-17 ENCOUNTER — Ambulatory Visit (INDEPENDENT_AMBULATORY_CARE_PROVIDER_SITE_OTHER): Payer: Medicaid Other

## 2023-01-17 VITALS — BP 133/84 | HR 93 | Wt 244.6 lb

## 2023-01-17 DIAGNOSIS — I10 Essential (primary) hypertension: Secondary | ICD-10-CM

## 2023-01-17 DIAGNOSIS — Z363 Encounter for antenatal screening for malformations: Secondary | ICD-10-CM | POA: Diagnosis not present

## 2023-01-17 DIAGNOSIS — O0990 Supervision of high risk pregnancy, unspecified, unspecified trimester: Secondary | ICD-10-CM | POA: Diagnosis not present

## 2023-01-17 DIAGNOSIS — Z98891 History of uterine scar from previous surgery: Secondary | ICD-10-CM

## 2023-01-17 DIAGNOSIS — Z3A19 19 weeks gestation of pregnancy: Secondary | ICD-10-CM

## 2023-01-17 DIAGNOSIS — O0992 Supervision of high risk pregnancy, unspecified, second trimester: Secondary | ICD-10-CM

## 2023-01-17 DIAGNOSIS — Z6841 Body Mass Index (BMI) 40.0 and over, adult: Secondary | ICD-10-CM

## 2023-01-17 NOTE — Progress Notes (Signed)
[place] 19+6 wks,breech,posterior placenta gr 0,normal ovaries,cx 4.2 cm,FHR 150 bpm,SVP of fluid 5.4 cm,EFW 318 g 46%,anatomy complete,no obvious abnormalities

## 2023-01-17 NOTE — Progress Notes (Signed)
HIGH-RISK PREGNANCY VISIT Patient name: Allison Garcia MRN [id number]  Date of birth: Mar 26, 1991 Chief Complaint:   Routine Prenatal Visit  History of Present Illness:   Allison Garcia is a 32 y.o. G31P1001 female at [redacted]w[redacted]d with an Estimated Date of Delivery: 06/07/23 being seen today for ongoing management of a high-risk pregnancy complicated by:  -Chronic hypertension-no meds currently -Prior C-section, desires repeat with BTL   Today she reports Issues with carpal tunnel syndrome.  She is already wearing the wrist guards at night  Contractions: Not present. Vag. Bleeding: None.  Movement: Present. denies leaking of fluid.      12/04/2022    9:59 AM  Depression screen PHQ 2/9  Decreased Interest 1  Down, Depressed, Hopeless 0  PHQ - 2 Score 1  Tired, decreased energy 2  Change in appetite 1  Feeling bad or failure about yourself  0  Trouble concentrating 0  Moving slowly or fidgety/restless 0  Suicidal thoughts 0     Current Outpatient Medications  Medication Instructions   acetaminophen (TYLENOL) 1,000 mg, Oral, Every 6 hours PRN   albuterol (VENTOLIN HFA) 108 (90 Base) MCG/ACT inhaler 1-2 puffs, Inhalation, Every 6 hours PRN   aspirin EC 162 mg, Oral, Daily, Swallow whole.   Blood Pressure Monitor MISC For regular home bp monitoring during pregnancy   Doxylamine-Pyridoxine (DICLEGIS) 10-10 MG TBEC 2 tabs q hs, if sx persist add 1 tab q am on day 3, if sx persist add 1 tab q afternoon on day 4   Prenatal Vit-Fe Fumarate-FA (PRENATAL VITAMIN PO) Oral     Review of Systems:   Pertinent items are noted in HPI Denies abnormal vaginal discharge w/ itching/odor/irritation, headaches, visual changes, shortness of breath, chest pain, abdominal pain, severe nausea/vomiting, or problems with urination or bowel movements unless otherwise stated above. Pertinent History Reviewed:  Reviewed past medical,surgical, social, obstetrical and family history.  Reviewed problem list,  medications and allergies. Physical Assessment:   Vitals:   01/17/23 0921  BP: 133/84  Pulse: 93  Weight: 244 lb 9.6 oz (110.9 kg)  Body mass index is 46.22 kg/m.           Physical Examination:   General appearance: alert, well appearing, and in no distress  Mental status: normal mood, behavior, speech, dress, motor activity, and thought processes  Skin: warm & dry   Extremities: Edema: None    Cardiovascular: normal heart rate noted  Respiratory: normal respiratory effort, no distress  Abdomen: gravid, soft, non-tender  Pelvic: Cervical exam deferred         Fetal Status:     Movement: Present    Fetal Surveillance Testing today: [place] 19+6 wks,breech,posterior placenta gr 0,normal ovaries,cx 4.2 cm,FHR 150 bpm,SVP of fluid 5.4 cm,EFW 318 g 46%,anatomy complete,no obvious abnormalities    Chaperone: N/A    No results found for this or any previous visit (from the past 24 hour(s)).   Assessment & Plan:  High-risk pregnancy: G2P1001 at [redacted]w[redacted]d with an Estimated Date of Delivery: 06/07/23   1) chronic hypertension -No meds currently -Reviewed her concerns.  And discussed when to call/come to clinic for RN visit -Will continue growth every 4 weeks  2) prior C-section 3) morbid obesity 4 (carpal tunnel syndrome  -Reviewed today's anatomy scan, AGA  Meds: No orders of the defined types were placed in this encounter.   Labs/procedures today: Anatomy scan  Treatment Plan: As outlined above  Reviewed: Preterm labor symptoms and general obstetric precautions  including but not limited to vaginal bleeding, contractions, leaking of fluid and fetal movement were reviewed in detail with the patient.  All questions were answered.  Patient has home bp cuff. Check bp weekly, let us know if >140/90.   Follow-up: No follow-ups on file.   Future Appointments  Date Time Provider Department Center  02/21/2023  8:30 AM [facility] [place] [facility] None  02/21/2023  9:30 AM Myna Hidalgo,  DO [facility]    No orders of the defined types were placed in this encounter.   Myna Hidalgo, DO Attending Obstetrician & Gynecologist, [facility] for [employer], [facility]

## 2023-01-18 ENCOUNTER — Encounter (HOSPITAL_COMMUNITY): Payer: Self-pay | Admitting: Obstetrics and Gynecology

## 2023-01-18 ENCOUNTER — Inpatient Hospital Stay (HOSPITAL_COMMUNITY): Payer: Medicaid Other

## 2023-01-18 ENCOUNTER — Inpatient Hospital Stay (HOSPITAL_COMMUNITY)
Admission: AD | Admit: 2023-01-18 | Discharge: 2023-01-18 | Disposition: A | Discharge status: Home / Self Care | Payer: Medicaid Other | Attending: Obstetrics and Gynecology | Admitting: Obstetrics and Gynecology

## 2023-01-18 DIAGNOSIS — I10 Essential (primary) hypertension: Secondary | ICD-10-CM | POA: Diagnosis not present

## 2023-01-18 DIAGNOSIS — R3129 Other microscopic hematuria: Secondary | ICD-10-CM

## 2023-01-18 DIAGNOSIS — Z87891 Personal history of nicotine dependence: Secondary | ICD-10-CM | POA: Insufficient documentation

## 2023-01-18 DIAGNOSIS — R110 Nausea: Secondary | ICD-10-CM | POA: Diagnosis not present

## 2023-01-18 DIAGNOSIS — O99891 Other specified diseases and conditions complicating pregnancy: Secondary | ICD-10-CM | POA: Insufficient documentation

## 2023-01-18 DIAGNOSIS — N1330 Unspecified hydronephrosis: Secondary | ICD-10-CM | POA: Diagnosis not present

## 2023-01-18 DIAGNOSIS — R109 Unspecified abdominal pain: Secondary | ICD-10-CM | POA: Insufficient documentation

## 2023-01-18 DIAGNOSIS — Z3A20 20 weeks gestation of pregnancy: Secondary | ICD-10-CM | POA: Diagnosis not present

## 2023-01-18 DIAGNOSIS — M549 Dorsalgia, unspecified: Secondary | ICD-10-CM | POA: Diagnosis not present

## 2023-01-18 LAB — URINALYSIS, ROUTINE W REFLEX MICROSCOPIC
Bilirubin Urine: NEGATIVE
Glucose, UA: NEGATIVE mg/dL
Ketones, ur: NEGATIVE mg/dL
Leukocytes,Ua: NEGATIVE
Nitrite: NEGATIVE
Protein, ur: NEGATIVE mg/dL
Specific Gravity, Urine: 1.002 — ABNORMAL LOW (ref 1.005–1.030)
pH: 6 (ref 5.0–8.0)

## 2023-01-18 MED ORDER — CYCLOBENZAPRINE HCL 5 MG PO TABS: 5.0000 mg | ORAL_TABLET | Freq: Three times a day (TID) | ORAL | 0 refills | Status: DC | PRN

## 2023-01-18 MED ORDER — CYCLOBENZAPRINE HCL 5 MG PO TABS
10.0000 mg | ORAL_TABLET | Freq: Once | ORAL | Status: AC
  Administered 2023-01-18: 10 mg via ORAL
  Filled 2023-01-18: qty 2

## 2023-01-18 NOTE — MAU Provider Note (Signed)
Chief Complaint:  Flank Pain   Event Date/Time   First Provider Initiated Contact with Patient 01/18/23 0439     HPI: Allison Garcia is a 32 y.o. G2P1001 at 42w0dwho presents to maternity admissions reporting right flank pain since 2300hrs.  Tylenol did not help, nor did heat.  Constant and dull but initially stabbing. . She denies LOF, vaginal bleeding, urinary symptoms, h/a, dizziness, n/v, diarrhea, constipation or fever/chills.  .  Flank Pain This is a new problem. The current episode started today. The problem is unchanged. The quality of the pain is described as aching. The pain does not radiate. The pain is The same all the time. Stiffness is present All day. Pertinent negatives include no abdominal pain, dysuria, fever, headaches, numbness or tingling. She has tried heat and analgesics for the symptoms. The treatment provided no relief.   RN Note: Allison Garcia is a 32 y.o. at [redacted]w[redacted]d here in MAU reporting: left flank pain that began around 2300 took tylenol and fluids but had to relief. Patient describes the pain as constant dull pain, earlier the pain was stabbing.   Past Medical History: Past Medical History:  Diagnosis Date   Abnormal Pap smear of cervix    Back pain    GERD (gastroesophageal reflux disease) 03/22/2020   Gestational diabetes    History of stomach ulcers    told by ED. no EGD   Preeclampsia     Past obstetric history: OB History  Gravida Para Term Preterm AB Living  2 1 1     1   SAB IAB Ectopic Multiple Live Births          1    # Outcome Date GA Lbr Len/2nd Weight Sex Delivery Anes PTL Lv  2 Current           1 Term 05/23/18 [redacted]w[redacted]d  3147 g F CS-LTranv EPI N LIV     Complications: Failure to Progress in First Stage, Pre-eclampsia, Gestational diabetes    Past Surgical History: Past Surgical History:  Procedure Laterality Date   CESAREAN SECTION     ESOPHAGOGASTRODUODENOSCOPY (EGD) WITH PROPOFOL N/A 02/05/2020   Procedure: ESOPHAGOGASTRODUODENOSCOPY  (EGD) WITH PROPOFOL;  Surgeon: Corbin Ade, MD;  Erosive reflux esophagitis, normal examined stomach, normal examined duodenum.   TEAR DUCT PROBING     as baby    Family History: Family History  Problem Relation Age of Onset   Gallbladder disease Mother    Torticollis Daughter    Hypertension Maternal Grandmother    COPD Other    Colon cancer Neg Hx    Colon polyps Neg Hx     Social History: Social History   Tobacco Use   Smoking status: Former    Packs/day: 1.00    Years: 9.00    Additional pack years: 0.00    Total pack years: 9.00    Types: Cigarettes   Smokeless tobacco: Never  Vaping Use   Vaping Use: Former  Substance Use Topics   Alcohol use: Not Currently    Comment: occas   Drug use: No    Allergies:  Allergies  Allergen Reactions   Other Anaphylaxis and Shortness Of Breath    Bleach      Meds:  Medications Prior to Admission  Medication Sig Dispense Refill Last Dose   acetaminophen (TYLENOL) 500 MG tablet Take 1,000 mg by mouth every 6 (six) hours as needed for moderate pain.   01/18/2023 at 0030   aspirin EC 81 MG  tablet Take 2 tablets (162 mg total) by mouth daily. Swallow whole. 180 tablet 2 01/17/2023   Doxylamine-Pyridoxine (DICLEGIS) 10-10 MG TBEC 2 tabs q hs, if sx persist add 1 tab q am on day 3, if sx persist add 1 tab q afternoon on day 4 100 tablet 6 01/17/2023   Prenatal Vit-Fe Fumarate-FA (PRENATAL VITAMIN PO) Take by mouth.   01/17/2023   albuterol (VENTOLIN HFA) 108 (90 Base) MCG/ACT inhaler Inhale 1-2 puffs into the lungs every 6 (six) hours as needed for wheezing or shortness of breath.      Blood Pressure Monitor MISC For regular home bp monitoring during pregnancy 1 each 0     I have reviewed patient's Past Medical Hx, Surgical Hx, Family Hx, Social Hx, medications and allergies.   ROS:  Review of Systems  Constitutional:  Negative for fever.  Gastrointestinal:  Negative for abdominal pain.  Genitourinary:  Positive for flank  pain. Negative for dysuria.  Neurological:  Negative for tingling, numbness and headaches.   Other systems negative  Physical Exam   Vitals:   01/18/23 0347 01/18/23 0430 01/18/23 0702 01/18/23 0703  BP: (!) 158/84 (!) 140/80 132/76 132/76  Pulse: (!) 108 95 (!) 103 (!) 103  Resp: 20  20   Temp: 98 F (36.7 C)  98.5 F (36.9 C)   TempSrc: Oral  Oral   SpO2: 100%     Weight: 111.4 kg     Height: 5\' 1"  (1.549 m)       Constitutional: Well-developed, well-nourished female in no acute distress.  Cardiovascular: normal rate and rhythm Respiratory: normal effort GI: Abd soft, non-tender, gravid appropriate for gestational age.   No rebound or guarding. MS: Extremities nontender, no edema, normal ROM Neurologic: Alert and oriented x 4.  GU: Neg CVAT.  FHR 150   Labs: Results for orders placed or performed during the hospital encounter of 01/18/23 (from the past 24 hour(s))  Urinalysis, Routine w reflex microscopic -Urine, Clean Catch     Status: Abnormal   Collection Time: 01/18/23  3:27 AM  Result Value Ref Range   Color, Urine COLORLESS (A) YELLOW   APPearance CLEAR CLEAR   Specific Gravity, Urine 1.002 (L) 1.005 - 1.030   pH 6.0 5.0 - 8.0   Glucose, UA NEGATIVE NEGATIVE mg/dL   Hgb urine dipstick MODERATE (A) NEGATIVE   Bilirubin Urine NEGATIVE NEGATIVE   Ketones, ur NEGATIVE NEGATIVE mg/dL   Protein, ur NEGATIVE NEGATIVE mg/dL   Nitrite NEGATIVE NEGATIVE   Leukocytes,Ua NEGATIVE NEGATIVE   RBC / HPF 0-5 0 - 5 RBC/hpf   WBC, UA 0-5 0 - 5 WBC/hpf   Bacteria, UA RARE (A) NONE SEEN   Squamous Epithelial / HPF 0-5 0 - 5 /HPF   A/Positive/-- (04/01 1335)  Imaging:  US RENAL  Result Date: 01/18/2023 CLINICAL DATA:  Twenty weeks pregnant with right flank pain. EXAM: RENAL / URINARY TRACT ULTRASOUND COMPLETE COMPARISON:  None Available. FINDINGS: Right Kidney: Renal measurements: 11.8 x 5.8 x 5.3 cm = volume: 189.0 mL. Echogenicity within normal limits. No mass or  caliceal stone is seen. There is mild hydronephrosis. Left Kidney: Renal measurements: 12.8 x 6.0 x 5.5 cm = volume: 219.4 mL. Echogenicity within normal limits. No mass, stones or hydronephrosis visualized. Bladder: Appears normal for degree of bladder distention. Bilateral ureteral jets were confirmed during the study. Other: None. IMPRESSION: There is mild right hydronephrosis. No left hydronephrosis is seen. Bilateral ureteral jets are confirmed. Otherwise negative ultrasound.  Electronically Signed   By: Almira Bar M.D.   On: 01/18/2023 05:53    MAU Course/MDM: I have reviewed the triage vital signs and the nursing notes.   Pertinent labs & imaging results that were available during my care of the patient were reviewed by me and considered in my medical decision making (see chart for details).      I have reviewed her medical records including past results, notes and treatments.   I have ordered labs and reviewed results. UA showed microscopic hematuria and US showed only mild right hydronephrosis with bilateral jets seen.  Treatments in MAU included Flexeril which did improve her pain.  Discussed this may very well be related to muscle spasm although renal stone cannot be completely excluded with the US done.  Recommend pushing PO fluids and monitoring symptoms. .    Assessment: Single IUP at [redacted]w[redacted]d Right flank pain Possible lumbar muscle spasm. Mild right hydronephrosis, gestational vs possible stone  Plan: Discharge home Push PO fluids and monitor pain Rx Flexeril prn pain  Follow up in Office for prenatal visits and recheck Encouraged to return if she develops worsening of symptoms, increase in pain, fever, or other concerning symptoms.   Pt stable at time of discharge.  Wynelle Bourgeois CNM, MSN Certified Nurse-Midwife 01/18/2023 4:39 AM

## 2023-01-18 NOTE — MAU Note (Signed)
..  Allison Garcia is a 32 y.o. at [redacted]w[redacted]d here in MAU reporting: left flank pain that began around 2300 took tylenol and fluids but had to relief. Patient describes the pain as constant dull pain, earlier the pain was stabbing.   Pain score: 6/10 Vitals:   01/18/23 0347  BP: (!) 158/84  Pulse: (!) 108  Resp: 20  Temp: 98 F (36.7 C)  SpO2: 100%     FHT:150 Lab orders placed from triage: UA

## 2023-01-19 LAB — CULTURE, OB URINE

## 2023-01-30 ENCOUNTER — Encounter: Payer: Self-pay | Admitting: Obstetrics & Gynecology

## 2023-02-21 ENCOUNTER — Ambulatory Visit (INDEPENDENT_AMBULATORY_CARE_PROVIDER_SITE_OTHER): Payer: Medicaid Other | Admitting: Obstetrics & Gynecology

## 2023-02-21 ENCOUNTER — Ambulatory Visit (INDEPENDENT_AMBULATORY_CARE_PROVIDER_SITE_OTHER): Payer: Medicaid Other

## 2023-02-21 VITALS — BP 135/82 | HR 99 | Wt 252.0 lb

## 2023-02-21 DIAGNOSIS — O0992 Supervision of high risk pregnancy, unspecified, second trimester: Secondary | ICD-10-CM

## 2023-02-21 DIAGNOSIS — Z3A24 24 weeks gestation of pregnancy: Secondary | ICD-10-CM

## 2023-02-21 DIAGNOSIS — O0990 Supervision of high risk pregnancy, unspecified, unspecified trimester: Secondary | ICD-10-CM

## 2023-02-21 DIAGNOSIS — I10 Essential (primary) hypertension: Secondary | ICD-10-CM | POA: Diagnosis not present

## 2023-02-21 DIAGNOSIS — Z98891 History of uterine scar from previous surgery: Secondary | ICD-10-CM

## 2023-02-21 DIAGNOSIS — O10919 Unspecified pre-existing hypertension complicating pregnancy, unspecified trimester: Secondary | ICD-10-CM

## 2023-02-21 DIAGNOSIS — K219 Gastro-esophageal reflux disease without esophagitis: Secondary | ICD-10-CM

## 2023-02-21 MED ORDER — FAMOTIDINE 20 MG PO TABS
20.0000 mg | ORAL_TABLET | Freq: Two times a day (BID) | ORAL | 11 refills | Status: DC
Start: 2023-02-21 — End: 2023-07-02

## 2023-02-21 MED ORDER — LABETALOL HCL 100 MG PO TABS
100.0000 mg | ORAL_TABLET | Freq: Two times a day (BID) | ORAL | 5 refills | Status: DC
Start: 2023-02-21 — End: 2023-06-21

## 2023-02-21 NOTE — Progress Notes (Signed)
HIGH-RISK PREGNANCY VISIT Patient name: Allison Garcia MRN [id number]  Date of birth: May 29, 1991 Chief Complaint:   Routine Prenatal Visit  History of Present Illness:   Allison Garcia is a 32 y.o. G39P1001 female at [redacted]w[redacted]d with an Estimated Date of Delivery: 06/07/23 being seen today for ongoing management of a high-risk pregnancy complicated by:  -Chronic HTN: BPs reviewed majority at home 140-150/90s Denies HA or blurry vision.  Does note LE edema  -Prior C-section -Obesity  -Carpal tunnel Wearing guards at night Similar issue in prior pregnancy and did require steroid injection  Contractions: Not present. Vag. Bleeding: None.  Movement: Present. denies leaking of fluid.      12/04/2022    9:59 AM  Depression screen PHQ 2/9  Decreased Interest 1  Down, Depressed, Hopeless 0  PHQ - 2 Score 1  Tired, decreased energy 2  Change in appetite 1  Feeling bad or failure about yourself  0  Trouble concentrating 0  Moving slowly or fidgety/restless 0  Suicidal thoughts 0     Current Outpatient Medications  Medication Instructions   acetaminophen (TYLENOL) 1,000 mg, Oral, Every 6 hours PRN   albuterol (VENTOLIN HFA) 108 (90 Base) MCG/ACT inhaler 1-2 puffs, Inhalation, Every 6 hours PRN   aspirin EC 162 mg, Oral, Daily, Swallow whole.   Blood Pressure Monitor MISC For regular home bp monitoring during pregnancy   cyclobenzaprine (FLEXERIL) 5 mg, Oral, Every 8 hours PRN   Doxylamine-Pyridoxine (DICLEGIS) 10-10 MG TBEC 2 tabs q hs, if sx persist add 1 tab q am on day 3, if sx persist add 1 tab q afternoon on day 4   famotidine (PEPCID) 20 mg, Oral, 2 times daily   labetalol (NORMODYNE) 100 mg, Oral, 2 times daily   Prenatal Vit-Fe Fumarate-FA (PRENATAL VITAMIN PO) Oral     Review of Systems:   Pertinent items are noted in HPI Denies abnormal vaginal discharge w/ itching/odor/irritation, headaches, visual changes, shortness of breath, chest pain, abdominal pain, severe  nausea/vomiting, or problems with urination or bowel movements unless otherwise stated above. Pertinent History Reviewed:  Reviewed past medical,surgical, social, obstetrical and family history.  Reviewed problem list, medications and allergies. Physical Assessment:   Vitals:   02/21/23 0904  BP: 135/82  Pulse: 99  Weight: 252 lb (114.3 kg)  Body mass index is 47.61 kg/m.           Physical Examination:   General appearance: alert, well appearing, and in no distress  Mental status: normal mood and behavior  Skin: warm & dry   Extremities:      Cardiovascular: normal heart rate noted  Respiratory: normal respiratory effort, no distress  Abdomen: gravid, soft, non-tender  Pelvic: Cervical exam deferred         Fetal Status:     Movement: Present    Fetal Surveillance Testing today: cephalic,posterior placenta gr 0,normal ovaries,FHR 157 bpm,cx 4.8 cm,SVP of fluid 4.4 cm,EFW 788 g 58%,BPD 7%    Chaperone: N/A    No results found for this or any previous visit (from the past 24 hour(s)).   Assessment & Plan:  High-risk pregnancy: G2P1001 at [redacted]w[redacted]d with an Estimated Date of Delivery: 06/07/23   1) Chronic HTN -Plan to start on labetalol 100 mg twice daily -Continue growth every 4  2) obesity 3) prior C-section-desires repeat with sterilization  Meds:  Meds ordered this encounter  Medications   labetalol (NORMODYNE) 100 MG tablet    Sig: Take 1 tablet (100  mg total) by mouth 2 (two) times daily.    Dispense:  60 tablet    Refill:  5   famotidine (PEPCID) 20 MG tablet    Sig: Take 1 tablet (20 mg total) by mouth 2 (two) times daily.    Dispense:  60 tablet    Refill:  11    Labs/procedures today: growth scan  Treatment Plan:  as outlined above, PN2 next visit   Reviewed: Preterm labor symptoms and general obstetric precautions including but not limited to vaginal bleeding, contractions, leaking of fluid and fetal movement were reviewed in detail with the patient.   All questions were answered. Pt has home bp cuff. Check bp weekly, let us know if >140/90.   Follow-up: Return in about 2 weeks (around 03/07/2023) for 1-2wk HROB and PN2 and 4week growth (scheduled?).   Future Appointments  Date Time Provider Department Center  02/28/2023  9:10 AM [facility] NURSE [facility]  02/28/2023  9:30 AM Hermina Staggers, MD [facility]  03/21/2023  8:30 AM [facility] - [facility] [place] [facility] None  03/21/2023  9:30 AM Lazaro Arms, MD [facility]  04/12/2023  3:00 PM [facility] - [facility] [place] [facility] None  04/12/2023  3:50 PM Myna Hidalgo, DO [facility]  04/16/2023  3:30 PM [facility] NURSE [facility]  04/17/2023  3:10 PM [facility] NURSE [facility]  04/20/2023  8:30 AM [facility] [place] [facility] None  04/20/2023  9:30 AM Lazaro Arms, MD [facility]  04/23/2023  3:30 PM [facility] NURSE [facility]  05/10/2023  8:30 AM [facility] [place] [facility] None  05/14/2023  3:30 PM [facility] NURSE [facility]  05/17/2023  8:30 AM [facility] [place] [facility] None  05/21/2023  3:30 PM [facility] NURSE [facility]  05/24/2023  3:00 PM [facility] [place] [facility] None  05/28/2023  3:30 PM [facility] NURSE [facility]  05/31/2023  8:30 AM [facility] [place] [facility] None  06/04/2023  3:30 PM [facility] NURSE [facility]    Orders Placed This Encounter  Procedures   US FETAL BPP WO NON STRESS    Myna Hidalgo, DO Attending Obstetrician & Gynecologist, [facility] [employer], [facility]

## 2023-02-21 NOTE — Progress Notes (Signed)
[place] 24+6 wks,cephalic,posterior placenta gr 0,normal ovaries,FHR 157 bpm,cx 4.8 cm,SVP of fluid 4.4 cm,EFW 788 g 58%,BPD 7%

## 2023-02-27 ENCOUNTER — Encounter: Payer: Self-pay | Admitting: Women's Health

## 2023-02-28 ENCOUNTER — Other Ambulatory Visit: Payer: Medicaid Other

## 2023-02-28 ENCOUNTER — Encounter: Payer: Self-pay | Admitting: Obstetrics and Gynecology

## 2023-02-28 ENCOUNTER — Ambulatory Visit (INDEPENDENT_AMBULATORY_CARE_PROVIDER_SITE_OTHER): Payer: Medicaid Other | Admitting: Obstetrics and Gynecology

## 2023-02-28 VITALS — BP 137/81 | HR 91 | Wt 256.0 lb

## 2023-02-28 DIAGNOSIS — O0992 Supervision of high risk pregnancy, unspecified, second trimester: Secondary | ICD-10-CM | POA: Diagnosis not present

## 2023-02-28 DIAGNOSIS — Z98891 History of uterine scar from previous surgery: Secondary | ICD-10-CM

## 2023-02-28 DIAGNOSIS — Z131 Encounter for screening for diabetes mellitus: Secondary | ICD-10-CM | POA: Diagnosis not present

## 2023-02-28 DIAGNOSIS — I10 Essential (primary) hypertension: Secondary | ICD-10-CM

## 2023-02-28 DIAGNOSIS — O09299 Supervision of pregnancy with other poor reproductive or obstetric history, unspecified trimester: Secondary | ICD-10-CM

## 2023-02-28 DIAGNOSIS — Z8632 Personal history of gestational diabetes: Secondary | ICD-10-CM

## 2023-02-28 DIAGNOSIS — Z3A25 25 weeks gestation of pregnancy: Secondary | ICD-10-CM | POA: Diagnosis not present

## 2023-02-28 DIAGNOSIS — Z3009 Encounter for other general counseling and advice on contraception: Secondary | ICD-10-CM | POA: Insufficient documentation

## 2023-02-28 DIAGNOSIS — O0990 Supervision of high risk pregnancy, unspecified, unspecified trimester: Secondary | ICD-10-CM

## 2023-02-28 DIAGNOSIS — O09292 Supervision of pregnancy with other poor reproductive or obstetric history, second trimester: Secondary | ICD-10-CM

## 2023-02-28 MED ORDER — NYSTATIN 100000 UNIT/GM EX CREA
1.0000 | TOPICAL_CREAM | Freq: Two times a day (BID) | CUTANEOUS | 1 refills | Status: DC
Start: 2023-02-28 — End: 2023-04-20

## 2023-02-28 NOTE — Progress Notes (Signed)
Subjective:  Allison Garcia is a 32 y.o. G2P1001 at [redacted]w[redacted]d being seen today for ongoing prenatal care.  She is currently monitored for the following issues for this high-risk pregnancy and has Dyspepsia; Reflux esophagitis; GERD (gastroesophageal reflux disease); Chronic hypertension; History of gestational diabetes; History of cesarean delivery; Supervision of high risk pregnancy, antepartum; Hx of severe preeclampsia; and Unwanted fertility on their problem list.  Patient reports  skin irratation .  Contractions: Not present.  .  Movement: Present. Denies leaking of fluid.   The following portions of the patient's history were reviewed and updated as appropriate: allergies, current medications, past family history, past medical history, past social history, past surgical history and problem list. Problem list updated.  Objective:   Vitals:   02/28/23 0931  BP: 137/81  Pulse: 91  Weight: 256 lb (116.1 kg)    Fetal Status:     Movement: Present     General:  Alert, oriented and cooperative. Patient is in no acute distress.  Skin: Skin is warm and dry. No rash noted.   Cardiovascular: Normal heart rate noted  Respiratory: Normal respiratory effort, no problems with respiration noted  Abdomen: Soft, gravid, appropriate for gestational age. Pain/Pressure: Absent     Pelvic:  Cervical exam deferred        Extremities: Normal range of motion.     Mental Status: Normal mood and affect. Normal behavior. Normal judgment and thought content.   Urinalysis:      Assessment and Plan:  Pregnancy: G2P1001 at [redacted]w[redacted]d  1. Supervision of high risk pregnancy, antepartum Stable Glucola today  2. Chronic hypertension Stable Continue with current management Serial growth and weekly antenatal testing as per MFM guidelines  3. History of gestational diabetes Glucola today  4. History of cesarean delivery Desires repeat  5. Hx of severe preeclampsia No S/Sx at present  6. Unwanted  fertility BTL papers signed today  Preterm labor symptoms and general obstetric precautions including but not limited to vaginal bleeding, contractions, leaking of fluid and fetal movement were reviewed in detail with the patient. Please refer to After Visit Summary for other counseling recommendations.  Return in about 3 weeks (around 03/21/2023) for OB visit, face to face, any provider.   Hermina Staggers, MD

## 2023-03-01 LAB — CBC
Hematocrit: 39.2 % (ref 34.0–46.6)
Hemoglobin: 13.1 g/dL (ref 11.1–15.9)
MCH: 30.2 pg (ref 26.6–33.0)
MCHC: 33.4 g/dL (ref 31.5–35.7)
MCV: 90 fL (ref 79–97)
Platelets: 281 10*3/uL (ref 150–450)
RBC: 4.34 x10E6/uL (ref 3.77–5.28)
RDW: 13.5 % (ref 11.7–15.4)
WBC: 11.9 10*3/uL — ABNORMAL HIGH (ref 3.4–10.8)

## 2023-03-01 LAB — GLUCOSE TOLERANCE, 2 HOURS W/ 1HR
Glucose, 1 hour: 238 mg/dL — ABNORMAL HIGH (ref 70–179)
Glucose, 2 hour: 149 mg/dL (ref 70–152)
Glucose, Fasting: 98 mg/dL — ABNORMAL HIGH (ref 70–91)

## 2023-03-01 LAB — HIV ANTIBODY (ROUTINE TESTING W REFLEX): HIV Screen 4th Generation wRfx: NONREACTIVE

## 2023-03-01 LAB — ANTIBODY SCREEN: Antibody Screen: NEGATIVE

## 2023-03-01 LAB — RPR: RPR Ser Ql: NONREACTIVE

## 2023-03-02 ENCOUNTER — Telehealth: Payer: Self-pay | Admitting: *Deleted

## 2023-03-02 ENCOUNTER — Encounter: Payer: Self-pay | Admitting: Obstetrics and Gynecology

## 2023-03-02 ENCOUNTER — Encounter: Payer: Self-pay | Admitting: *Deleted

## 2023-03-02 DIAGNOSIS — O24419 Gestational diabetes mellitus in pregnancy, unspecified control: Secondary | ICD-10-CM | POA: Insufficient documentation

## 2023-03-02 DIAGNOSIS — O24410 Gestational diabetes mellitus in pregnancy, diet controlled: Secondary | ICD-10-CM

## 2023-03-02 MED ORDER — ACCU-CHEK SOFTCLIX LANCETS MISC
12 refills | Status: DC
Start: 2023-03-02 — End: 2023-05-21

## 2023-03-02 MED ORDER — ACCU-CHEK GUIDE ME W/DEVICE KIT
1.0000 | PACK | Freq: Four times a day (QID) | 0 refills | Status: DC
Start: 2023-03-02 — End: 2023-07-02

## 2023-03-02 MED ORDER — ACCU-CHEK GUIDE VI STRP
ORAL_STRIP | 12 refills | Status: DC
Start: 2023-03-02 — End: 2023-05-21

## 2023-03-02 NOTE — Telephone Encounter (Signed)
Called patient to inform of GDM. LMOVM and mychart message sent.

## 2023-03-07 ENCOUNTER — Encounter: Payer: Medicaid Other | Attending: Obstetrics and Gynecology | Admitting: Registered"

## 2023-03-07 DIAGNOSIS — O24419 Gestational diabetes mellitus in pregnancy, unspecified control: Secondary | ICD-10-CM | POA: Diagnosis not present

## 2023-03-08 ENCOUNTER — Encounter: Payer: Self-pay | Admitting: Registered"

## 2023-03-08 NOTE — Progress Notes (Signed)
Patient was seen on 03/07/23 for Gestational Diabetes self-management class at the [facility]. The following learning objectives were met by the patient during this course:  States the definition of Gestational Diabetes States why dietary management is important in controlling blood glucose Describes the effects each nutrient has on blood glucose levels Demonstrates ability to create a balanced meal plan Demonstrates carbohydrate counting  States when to check blood glucose levels Demonstrates proper blood glucose monitoring techniques States the effect of stress and exercise on blood glucose levels States the importance of limiting caffeine and abstaining from alcohol and smoking  Blood glucose monitor given: none  Patient instructed to monitor glucose levels: FBS: 60 - <90 1 hour: <140 2 hour: <120  *Patient received handouts: Nutrition Diabetes and Pregnancy Carbohydrate Counting List  Patient will be seen for follow-up as needed.  

## 2023-03-21 ENCOUNTER — Encounter: Payer: Self-pay | Admitting: Obstetrics & Gynecology

## 2023-03-21 ENCOUNTER — Other Ambulatory Visit: Payer: Medicaid Other

## 2023-03-21 ENCOUNTER — Ambulatory Visit: Payer: Medicaid Other | Admitting: Obstetrics & Gynecology

## 2023-03-21 VITALS — BP 113/73 | HR 94 | Wt 254.0 lb

## 2023-03-21 DIAGNOSIS — O24419 Gestational diabetes mellitus in pregnancy, unspecified control: Secondary | ICD-10-CM

## 2023-03-21 DIAGNOSIS — O0993 Supervision of high risk pregnancy, unspecified, third trimester: Secondary | ICD-10-CM

## 2023-03-21 DIAGNOSIS — I10 Essential (primary) hypertension: Secondary | ICD-10-CM

## 2023-03-21 DIAGNOSIS — Z3A28 28 weeks gestation of pregnancy: Secondary | ICD-10-CM

## 2023-03-21 DIAGNOSIS — O0990 Supervision of high risk pregnancy, unspecified, unspecified trimester: Secondary | ICD-10-CM

## 2023-03-21 DIAGNOSIS — Z98891 History of uterine scar from previous surgery: Secondary | ICD-10-CM

## 2023-03-21 LAB — POCT URINALYSIS DIPSTICK OB
Blood, UA: NEGATIVE
Glucose, UA: NEGATIVE
Ketones, UA: NEGATIVE
Leukocytes, UA: NEGATIVE
Nitrite, UA: NEGATIVE
POC,PROTEIN,UA: NEGATIVE

## 2023-03-21 MED ORDER — METFORMIN HCL 500 MG PO TABS: 500.0000 mg | ORAL_TABLET | Freq: Two times a day (BID) | ORAL | 2 refills | Status: DC

## 2023-03-21 NOTE — Progress Notes (Signed)
[place] 28+6 wks,cephalic,posterior placenta gr 2,AFI 17 cm,mild right renal pelvic dilatation 5.7 mm,LK WNL,FHR 152 bpm,cx 2.9 cm,EFW 1468 g 74%

## 2023-03-21 NOTE — Progress Notes (Signed)
HIGH-RISK PREGNANCY VISIT Patient name: [name] MRN [id number]  Date of birth: 22-May-1991 Chief Complaint:   Routine Prenatal Visit  History of Present Illness:   Allison Garcia is a 32 y.o. G30P1001 female at [redacted]w[redacted]d with an Estimated Date of Delivery: 06/07/23 being seen today for ongoing management of a high-risk pregnancy complicated by cHTN on labetalol 100 BID + ASA 162.    Today she reports no complaints. Contractions: Not present. Vag. Bleeding: None.  Movement: Present. denies leaking of fluid.      03/08/2023    8:00 PM 12/04/2022    9:59 AM  Depression screen PHQ 2/9  Decreased Interest 0 1  Down, Depressed, Hopeless 0 0  PHQ - 2 Score 0 1  Tired, decreased energy  2  Change in appetite  1  Feeling bad or failure about yourself   0  Trouble concentrating  0  Moving slowly or fidgety/restless  0  Suicidal thoughts  0        12/04/2022    9:59 AM  GAD 7 : Generalized Anxiety Score  Nervous, Anxious, on Edge 0  Control/stop worrying 0  Worry too much - different things 0  Trouble relaxing 0  Restless 1  Easily annoyed or irritable 1  Afraid - awful might happen 0  Total GAD 7 Score 2     Review of Systems:   Pertinent items are noted in HPI Denies abnormal vaginal discharge w/ itching/odor/irritation, headaches, visual changes, shortness of breath, chest pain, abdominal pain, severe nausea/vomiting, or problems with urination or bowel movements unless otherwise stated above. Pertinent History Reviewed:  Reviewed past medical,surgical, social, obstetrical and family history.  Reviewed problem list, medications and allergies. Physical Assessment:   Vitals:   03/21/23 0900  BP: 113/73  Pulse: 94  Weight: 254 lb (115.2 kg)  Body mass index is 47.99 kg/m.           Physical Examination:   General appearance: alert, well appearing, and in no distress  Mental status: alert, oriented to person, place, and time  Skin: warm & dry   Extremities:       Cardiovascular: normal heart rate noted  Respiratory: normal respiratory effort, no distress  Abdomen: gravid, soft, non-tender  Pelvic: Cervical exam deferred         Fetal Status:     Movement: Present    Fetal Surveillance Testing today: sonogram 74%   Chaperone: N/A    Results for orders placed or performed in visit on 03/21/23 (from the past 24 hour(s))  POC Urinalysis Dipstick OB   Collection Time: 03/21/23  9:06 AM  Result Value Ref Range   Color, UA     Clarity, UA     Glucose, UA Negative Negative   Bilirubin, UA     Ketones, UA neg    Spec Grav, UA     Blood, UA neg    pH, UA     POC,PROTEIN,UA Negative Negative, Trace, Small (1+), Moderate (2+), Large (3+), 4+   Urobilinogen, UA     Nitrite, UA neg    Leukocytes, UA Negative Negative   Appearance     Odor      Assessment & Plan:  High-risk pregnancy: G2P1001 at [redacted]w[redacted]d with an Estimated Date of Delivery: 06/07/23      ICD-10-CM   1. Supervision of high risk pregnancy, antepartum  O09.90 POC Urinalysis Dipstick OB    2. Chronic hypertension  I10  3. Gestational diabetes mellitus, class A2, ^fastings  O24.419    add metformin 500 qhs        Meds: No orders of the defined types were placed in this encounter.   Orders:  Orders Placed This Encounter  Procedures   POC Urinalysis Dipstick OB     Labs/procedures today:    Treatment Plan:  per protocol for cHTN, now A2DM  Reviewed: Preterm labor symptoms and general obstetric precautions including but not limited to vaginal bleeding, contractions, leaking of fluid and fetal movement were reviewed in detail with the patient.  All questions were answered. Does have home bp cuff. Office bp cuff given: yes. Check bp daily, let us know if consistently >150 and/or >95.  Follow-up: Return for keep scheduled.   Future Appointments  Date Time Provider Department Center  04/12/2023  3:00 PM [facility] - [facility] [place] [facility] None  04/12/2023  3:50 PM Myna Hidalgo,  DO [facility]  04/17/2023  3:10 PM [facility] NURSE [facility]  04/20/2023  8:30 AM [facility] - [facility] [place] [facility] None  04/20/2023  9:30 AM Lazaro Arms, MD [facility]  04/23/2023  3:30 PM [facility] NURSE [facility]  04/26/2023  2:15 PM [facility] - [facility] [place] [facility] None  04/26/2023  3:10 PM Myna Hidalgo, DO [facility]  04/30/2023  3:30 PM [facility] NURSE [facility]  05/03/2023  8:30 AM [facility] - [facility]YN [place] [facility] None  05/03/2023  9:30 AM Myna Hidalgo, DO [facility]  05/10/2023  8:30 AM [facility] - [facility] [place] [facility] None  05/10/2023  9:30 AM Myna Hidalgo, DO [facility]  05/14/2023  3:30 PM [facility] NURSE [facility]  05/17/2023  8:30 AM [facility] - [facility] [place] [facility] None  05/17/2023  9:30 AM Myna Hidalgo, DO [facility]  05/21/2023  3:30 PM [facility] NURSE [facility]  05/24/2023  3:00 PM [facility] - [facility] [place] [facility] None  05/24/2023  3:50 PM Lazaro Arms, MD [facility]  05/28/2023  3:30 PM [facility] NURSE [facility]  05/31/2023  8:30 AM [facility] - [facility] [place] [facility] None  05/31/2023  9:30 AM Lazaro Arms, MD [facility]  06/04/2023  3:30 PM [facility] NURSE [facility]    Orders Placed This Encounter  Procedures   POC Urinalysis Dipstick OB   Lazaro Arms  Attending Physician for the Center for [facility] 03/21/2023 9:46 AM

## 2023-03-22 ENCOUNTER — Other Ambulatory Visit: Payer: Medicaid Other

## 2023-04-11 ENCOUNTER — Other Ambulatory Visit: Payer: Self-pay | Admitting: Obstetrics & Gynecology

## 2023-04-11 DIAGNOSIS — O0990 Supervision of high risk pregnancy, unspecified, unspecified trimester: Secondary | ICD-10-CM

## 2023-04-11 DIAGNOSIS — I10 Essential (primary) hypertension: Secondary | ICD-10-CM

## 2023-04-12 ENCOUNTER — Ambulatory Visit (INDEPENDENT_AMBULATORY_CARE_PROVIDER_SITE_OTHER): Payer: Medicaid Other

## 2023-04-12 ENCOUNTER — Encounter: Payer: Self-pay | Admitting: Obstetrics & Gynecology

## 2023-04-12 ENCOUNTER — Ambulatory Visit (INDEPENDENT_AMBULATORY_CARE_PROVIDER_SITE_OTHER): Payer: Medicaid Other | Admitting: Obstetrics & Gynecology

## 2023-04-12 VITALS — BP 131/75 | HR 89 | Wt 255.4 lb

## 2023-04-12 DIAGNOSIS — Z3A32 32 weeks gestation of pregnancy: Secondary | ICD-10-CM | POA: Diagnosis not present

## 2023-04-12 DIAGNOSIS — O10919 Unspecified pre-existing hypertension complicating pregnancy, unspecified trimester: Secondary | ICD-10-CM

## 2023-04-12 DIAGNOSIS — O0993 Supervision of high risk pregnancy, unspecified, third trimester: Secondary | ICD-10-CM

## 2023-04-12 DIAGNOSIS — O0990 Supervision of high risk pregnancy, unspecified, unspecified trimester: Secondary | ICD-10-CM

## 2023-04-12 DIAGNOSIS — Z3009 Encounter for other general counseling and advice on contraception: Secondary | ICD-10-CM

## 2023-04-12 DIAGNOSIS — I10 Essential (primary) hypertension: Secondary | ICD-10-CM | POA: Diagnosis not present

## 2023-04-12 DIAGNOSIS — O35EXX0 Maternal care for other (suspected) fetal abnormality and damage, fetal genitourinary anomalies, not applicable or unspecified: Secondary | ICD-10-CM

## 2023-04-12 DIAGNOSIS — O24419 Gestational diabetes mellitus in pregnancy, unspecified control: Secondary | ICD-10-CM

## 2023-04-12 DIAGNOSIS — Z98891 History of uterine scar from previous surgery: Secondary | ICD-10-CM

## 2023-04-12 DIAGNOSIS — O10913 Unspecified pre-existing hypertension complicating pregnancy, third trimester: Secondary | ICD-10-CM

## 2023-04-12 NOTE — Progress Notes (Signed)
[place] 32 wks,cephalic,BPP 8/8,AFI 19 cm,posterior fundal placenta gr 3,EFW 2203 g 83%,right renal pelvic dilatation 8.3 mm,normal left kidney,RI .58,.65,.68=64%

## 2023-04-12 NOTE — Progress Notes (Signed)
HIGH-RISK PREGNANCY VISIT Patient name: Allison Garcia MRN [id number]  Date of birth: 01-Mar-1991 Chief Complaint:   Routine Prenatal Visit  History of Present Illness:   Allison Garcia is a 32 y.o. G46P1001 female at [redacted]w[redacted]d with an Estimated Date of Delivery: 06/07/23 being seen today for ongoing management of a high-risk pregnancy complicated by:  -prior C_section- desires repeat with sterilization -Chronic HTN on Labetalol -GDMA2- no issues, no metformin   Today she reports no complaints.   Contractions: Not present. Vag. Bleeding: None.  Movement: Present. denies leaking of fluid.      03/08/2023    8:00 PM 12/04/2022    9:59 AM  Depression screen PHQ 2/9  Decreased Interest 0 1  Down, Depressed, Hopeless 0 0  PHQ - 2 Score 0 1  Tired, decreased energy  2  Change in appetite  1  Feeling bad or failure about yourself   0  Trouble concentrating  0  Moving slowly or fidgety/restless  0  Suicidal thoughts  0     Current Outpatient Medications  Medication Instructions   Accu-Chek Softclix Lancets lancets Use as instructed to check blood sugar 4 times daily   acetaminophen (TYLENOL) 1,000 mg, Oral, Every 6 hours PRN   albuterol (VENTOLIN HFA) 108 (90 Base) MCG/ACT inhaler 1-2 puffs, Inhalation, Every 6 hours PRN   aspirin EC 162 mg, Oral, Daily, Swallow whole.   Blood Glucose Monitoring Suppl (ACCU-CHEK GUIDE ME) w/Device KIT 1 each, Does not apply, 4 times daily   Blood Pressure Monitor MISC For regular home bp monitoring during pregnancy   cyclobenzaprine (FLEXERIL) 5 mg, Oral, Every 8 hours PRN   Doxylamine-Pyridoxine (DICLEGIS) 10-10 MG TBEC 2 tabs q hs, if sx persist add 1 tab q am on day 3, if sx persist add 1 tab q afternoon on day 4   famotidine (PEPCID) 20 mg, Oral, 2 times daily   glucose blood (ACCU-CHEK GUIDE) test strip Use as instructed to check blood sugar 4 times daily   labetalol (NORMODYNE) 100 mg, Oral, 2 times daily   metFORMIN (GLUCOPHAGE) 500 mg, Oral, 2  times daily with meals   nystatin cream (MYCOSTATIN) 1 Application, Topical, 2 times daily   Prenatal Vit-Fe Fumarate-FA (PRENATAL VITAMIN PO) Oral     Review of Systems:   Pertinent items are noted in HPI Denies abnormal vaginal discharge w/ itching/odor/irritation, headaches, visual changes, shortness of breath, chest pain, abdominal pain, severe nausea/vomiting, or problems with urination or bowel movements unless otherwise stated above. Pertinent History Reviewed:  Reviewed past medical,surgical, social, obstetrical and family history.  Reviewed problem list, medications and allergies. Physical Assessment:   Vitals:   04/12/23 1537  BP: 131/75  Pulse: 89  Weight: 255 lb 6.4 oz (115.8 kg)  Body mass index is 48.26 kg/m.           Physical Examination:   General appearance: alert, well appearing, and in no distress  Mental status: normal mood, behavior, speech, dress, motor activity, and thought processes  Skin: warm & dry   Extremities:      Cardiovascular: normal heart rate noted  Respiratory: normal respiratory effort, no distress  Abdomen: gravid, soft, non-tender  Pelvic: Cervical exam deferred         Fetal Status:     Movement: Present    Fetal Surveillance Testing today: cephalic,BPP 8/8,AFI 19 cm,posterior fundal placenta gr 3,EFW 2203 g 83%,right renal pelvic dilatation 8.3 mm,normal left kidney,RI .58,.65,.68=64%    Chaperone: N/A  No results found for this or any previous visit (from the past 24 hour(s)).   Assessment & Plan:  High-risk pregnancy: G2P1001 at [redacted]w[redacted]d with an Estimated Date of Delivery: 06/07/23   1) Chronic HTN- doing well with current meds -BPP 8/8 -Continue antepartum fetal monitoring -Discussed induction/delivery 37 to 38 weeks -Looked at schedule plan for September 16, surgical referral sent  2) GDMA2 -Continue metformin  3) Moderate Fetal RPD []  plan to follow postnatal  Meds: No orders of the defined types were placed in this  encounter.   Labs/procedures today: BPP  Treatment Plan:  as outlined above  Reviewed: Preterm labor symptoms and general obstetric precautions including but not limited to vaginal bleeding, contractions, leaking of fluid and fetal movement were reviewed in detail with the patient.  All questions were answered. Pt has cuff, but currently not working well- in the process of getting it fixed   Follow-up: No follow-ups on file.   Future Appointments  Date Time Provider Department Center  04/12/2023  3:50 PM Myna Hidalgo, DO [facility]  04/17/2023  3:10 PM [facility] NURSE [facility]  04/20/2023  8:30 AM [facility] - [facility] [place] [facility] None  04/20/2023  9:30 AM Lazaro Arms, MD [facility]  04/23/2023  3:30 PM [facility] N[facility]SE [facility]  04/26/2023  2:15 PM [facility] - [facility] [place] [facility] None  04/26/2023  3:10 PM Myna Hidalgo, DO [facility]  04/30/2023  3:30 PM [facility] N[facility]  05/03/2023  8:30 AM [facility] [place] [facility] None  05/03/2023  9:30 AM Myna Hidalgo, DO [facility]  05/10/2023  8:30 AM [facility] [place] [facility] None  05/10/2023  9:30 AM Myna Hidalgo, DO [facility]  05/14/2023  3:30 PM [facility] N[facility]  05/17/2023  8:30 AM [facility] [place] [facility] None  05/17/2023  9:30 AM Myna Hidalgo, DO [facility]  05/21/2023  3:30 PM [facility] N[facility]  05/24/2023  3:00 PM [facility] [place] [facility] None  05/24/2023  3:50 PM Lazaro Arms, MD [facility]  05/28/2023  3:30 PM [facility] NURSE [facility]  05/31/2023  8:30 AM [facility] - [facility] [place] [facility] None  05/31/2023  9:30 AM Lazaro Arms, MD [facility]  06/04/2023  3:30 PM [facility] NURSE [facility]    No orders of the defined types were placed in this encounter.   Myna Hidalgo, DO Attending Obstetrician & Gynecologist, [facility] for [employer], [facility]

## 2023-04-16 ENCOUNTER — Other Ambulatory Visit: Payer: Medicaid Other

## 2023-04-17 ENCOUNTER — Ambulatory Visit (INDEPENDENT_AMBULATORY_CARE_PROVIDER_SITE_OTHER): Payer: Medicaid Other

## 2023-04-17 VITALS — BP 122/70 | HR 101 | Wt 258.0 lb

## 2023-04-17 DIAGNOSIS — Z3A32 32 weeks gestation of pregnancy: Secondary | ICD-10-CM

## 2023-04-17 DIAGNOSIS — O24419 Gestational diabetes mellitus in pregnancy, unspecified control: Secondary | ICD-10-CM

## 2023-04-17 DIAGNOSIS — Z1389 Encounter for screening for other disorder: Secondary | ICD-10-CM

## 2023-04-17 DIAGNOSIS — O288 Other abnormal findings on antenatal screening of mother: Secondary | ICD-10-CM

## 2023-04-17 DIAGNOSIS — I10 Essential (primary) hypertension: Secondary | ICD-10-CM

## 2023-04-17 DIAGNOSIS — Z331 Pregnant state, incidental: Secondary | ICD-10-CM

## 2023-04-17 LAB — POCT URINALYSIS DIPSTICK OB
Glucose, UA: NEGATIVE
Leukocytes, UA: NEGATIVE
Nitrite, UA: NEGATIVE
POC,PROTEIN,UA: NEGATIVE

## 2023-04-17 NOTE — Progress Notes (Signed)
   NURSE VISIT- NST  SUBJECTIVE:  Allison Garcia is a 32 y.o. G100P1001 female at [redacted]w[redacted]d, here for a NST for pregnancy complicated by [facility] and Diabetes: A2DM}.  She reports active fetal movement, contractions: none, vaginal bleeding: none, membranes: intact.   OBJECTIVE:  BP 122/70   Pulse (!) 101   Wt 258 lb (117 kg)   LMP 08/31/2022   BMI 48.75 kg/m   Appears well, no apparent distress  Results for orders placed or performed in visit on 04/17/23 (from the past 24 hour(s))  POC Urinalysis Dipstick OB   Collection Time: 04/17/23  4:09 PM  Result Value Ref Range   Color, UA     Clarity, UA     Glucose, UA Negative Negative   Bilirubin, UA     Ketones, UA trace    Spec Grav, UA     Blood, UA trace    pH, UA     POC,PROTEIN,UA Negative Negative, Trace, Small (1+), Moderate (2+), Large (3+), 4+   Urobilinogen, UA     Nitrite, UA negative    Leukocytes, UA Negative Negative   Appearance     Odor      NST: FHR baseline 135 bpm, Variability: moderate, Accelerations:present, Decelerations:  Absent= Cat 1 /reactive Toco: none   ASSESSMENT: G2P1001 at [redacted]w[redacted]d with CHTN and Diabetes: A2DM} NST reactive  PLAN: EFM strip reviewed by Dr. Charlotta Newton   Recommendations: keep next appointment as scheduled    Caralyn Guile  04/17/2023 4:20 PM

## 2023-04-18 ENCOUNTER — Other Ambulatory Visit: Payer: Medicaid Other

## 2023-04-19 ENCOUNTER — Other Ambulatory Visit: Payer: Self-pay | Admitting: Obstetrics & Gynecology

## 2023-04-19 DIAGNOSIS — O10919 Unspecified pre-existing hypertension complicating pregnancy, unspecified trimester: Secondary | ICD-10-CM

## 2023-04-20 ENCOUNTER — Encounter: Payer: Self-pay | Admitting: Obstetrics & Gynecology

## 2023-04-20 ENCOUNTER — Ambulatory Visit (INDEPENDENT_AMBULATORY_CARE_PROVIDER_SITE_OTHER): Payer: Medicaid Other

## 2023-04-20 ENCOUNTER — Ambulatory Visit (INDEPENDENT_AMBULATORY_CARE_PROVIDER_SITE_OTHER): Payer: Medicaid Other | Admitting: Obstetrics & Gynecology

## 2023-04-20 VITALS — BP 122/79 | HR 86 | Wt 256.0 lb

## 2023-04-20 DIAGNOSIS — Z3A33 33 weeks gestation of pregnancy: Secondary | ICD-10-CM | POA: Diagnosis not present

## 2023-04-20 DIAGNOSIS — O0990 Supervision of high risk pregnancy, unspecified, unspecified trimester: Secondary | ICD-10-CM

## 2023-04-20 DIAGNOSIS — O10913 Unspecified pre-existing hypertension complicating pregnancy, third trimester: Secondary | ICD-10-CM | POA: Diagnosis not present

## 2023-04-20 DIAGNOSIS — O10919 Unspecified pre-existing hypertension complicating pregnancy, unspecified trimester: Secondary | ICD-10-CM

## 2023-04-20 DIAGNOSIS — Z98891 History of uterine scar from previous surgery: Secondary | ICD-10-CM

## 2023-04-20 DIAGNOSIS — I10 Essential (primary) hypertension: Secondary | ICD-10-CM

## 2023-04-20 DIAGNOSIS — O0993 Supervision of high risk pregnancy, unspecified, third trimester: Secondary | ICD-10-CM

## 2023-04-20 DIAGNOSIS — O24419 Gestational diabetes mellitus in pregnancy, unspecified control: Secondary | ICD-10-CM

## 2023-04-20 LAB — POCT URINALYSIS DIPSTICK
Blood, UA: NEGATIVE
Glucose, UA: NEGATIVE
Ketones, UA: NEGATIVE
Leukocytes, UA: NEGATIVE
Nitrite, UA: NEGATIVE
Protein, UA: NEGATIVE

## 2023-04-20 MED ORDER — METFORMIN HCL 1000 MG PO TABS: 1000.0000 mg | ORAL_TABLET | Freq: Every day | ORAL | 2 refills | Status: DC

## 2023-04-20 NOTE — Progress Notes (Signed)
[place] 33+1 wks,cephalic,BPP 8/8,FHR 157 bpm,RI .52,.57,.58,.51=25%,AFI 19 cm,posterior placenta gr 3,right renal pelvis 4.7 mm,left renal pelvis 4.4 mm WNL

## 2023-04-20 NOTE — Progress Notes (Signed)
HIGH-RISK PREGNANCY VISIT Patient name: Allison Garcia MRN [id number]  Date of birth: Dec 18, 1990 Chief Complaint:   High Risk Gestation ([place] today)  History of Present Illness:   DASHIRA JHAVERI is a 32 y.o. G39P1001 female at [redacted]w[redacted]d with an Estimated Date of Delivery: 06/07/23 being seen today for ongoing management of a high-risk pregnancy complicated by [facility] on labetalol 100 mg BID, A2DM on metformin 500 ^1000 mg at bedtime by me today, history of severe pre eclampsia.    Today she reports no complaints. Contractions: Not present. Vag. Bleeding: None.  Movement: Present. denies leaking of fluid.      03/08/2023    8:00 PM 12/04/2022    9:59 AM  Depression screen PHQ 2/9  Decreased Interest 0 1  Down, Depressed, Hopeless 0 0  PHQ - 2 Score 0 1  Tired, decreased energy  2  Change in appetite  1  Feeling bad or failure about yourself   0  Trouble concentrating  0  Moving slowly or fidgety/restless  0  Suicidal thoughts  0        12/04/2022    9:59 AM  GAD 7 : Generalized Anxiety Score  Nervous, Anxious, on Edge 0  Control/stop worrying 0  Worry too much - different things 0  Trouble relaxing 0  Restless 1  Easily annoyed or irritable 1  Afraid - awful might happen 0  Total GAD 7 Score 2     Review of Systems:   Pertinent items are noted in HPI Denies abnormal vaginal discharge w/ itching/odor/irritation, headaches, visual changes, shortness of breath, chest pain, abdominal pain, severe nausea/vomiting, or problems with urination or bowel movements unless otherwise stated above. Pertinent History Reviewed:  Reviewed past medical,surgical, social, obstetrical and family history.  Reviewed problem list, medications and allergies. Physical Assessment:   Vitals:   04/20/23 0958  BP: 122/79  Pulse: 86  Weight: 256 lb (116.1 kg)  Body mass index is 48.37 kg/m.           Physical Examination:   General appearance: alert, well appearing, and in no distress  Mental status:  alert, oriented to person, place, and time  Skin: warm & dry   Extremities: Edema: None    Cardiovascular: normal heart rate noted  Respiratory: normal respiratory effort, no distress  Abdomen: gravid, soft, non-tender  Pelvic: Cervical exam deferred         Fetal Status:     Movement: Present    Fetal Surveillance Testing today: BPP 8/8 UAD 25%  Chaperone: N/A    No results found for this or any previous visit (from the past 24 hour(s)).   Assessment & Plan:  High-risk pregnancy: G2P1001 at [redacted]w[redacted]d with an Estimated Date of Delivery: 06/07/23      ICD-10-CM   1. Supervision of high risk pregnancy, antepartum  O09.90 POCT Urinalysis Dipstick    2. Chronic hypertension on labetalol 100 mg BID  I10     3. A2DM on metformin 1000 mg qhs  O24.419     4. [redacted] weeks gestation of pregnancy  Z3A.33 POCT Urinalysis Dipstick         Meds:  Meds ordered this encounter  Medications   metFORMIN (GLUCOPHAGE) 1000 MG tablet    Sig: Take 1 tablet (1,000 mg total) by mouth at bedtime.    Dispense:  30 tablet    Refill:  2    Orders:  Orders Placed This Encounter  Procedures   POCT Urinalysis  Dipstick     Labs/procedures today: U/S  Treatment Plan:  twice weekly surveillance   Follow-up: Return for keep scheduled.   Future Appointments  Date Time Provider Department Center  05/10/2023  8:30 AM [facility] - [facility]YN [place] [facility] None  05/10/2023  9:30 AM Jacklyn Shell, CNM [facility]  05/14/2023  3:30 PM [facility] NURSE [facility]  05/17/2023  8:30 AM [facility] - [facility] [place] [facility] None  05/17/2023  9:30 AM Myna Hidalgo, DO [facility]  05/18/2023  9:30 AM [facility] PAT 1 [facility] None  05/21/2023  3:30 PM [facility] NURSE [facility]  05/24/2023  3:00 PM [facility] - [facility] [place] [facility] None  05/24/2023  3:50 PM Anaih Brander, Amaryllis Dyke, MD [facility]    Orders Placed This Encounter  Procedures   POCT Urinalysis Dipstick   Lazaro Arms  Attending Physician for the Center for  [facility] 05/09/2023 6:28 PM

## 2023-04-23 ENCOUNTER — Ambulatory Visit: Payer: Medicaid Other | Admitting: *Deleted

## 2023-04-23 VITALS — BP 131/83 | HR 93 | Wt 259.0 lb

## 2023-04-23 DIAGNOSIS — O0993 Supervision of high risk pregnancy, unspecified, third trimester: Secondary | ICD-10-CM

## 2023-04-23 DIAGNOSIS — O0990 Supervision of high risk pregnancy, unspecified, unspecified trimester: Secondary | ICD-10-CM

## 2023-04-23 DIAGNOSIS — O24419 Gestational diabetes mellitus in pregnancy, unspecified control: Secondary | ICD-10-CM

## 2023-04-23 DIAGNOSIS — I10 Essential (primary) hypertension: Secondary | ICD-10-CM | POA: Diagnosis not present

## 2023-04-23 DIAGNOSIS — Z3A33 33 weeks gestation of pregnancy: Secondary | ICD-10-CM | POA: Diagnosis not present

## 2023-04-23 NOTE — Progress Notes (Addendum)
   NURSE VISIT- NST  SUBJECTIVE:  Allison Garcia is a 32 y.o. G34P1001 female at [redacted]w[redacted]d, here for a NST for pregnancy complicated by [facility] and Diabetes: A2DM}.  She reports active fetal movement, contractions: none, vaginal bleeding: none, membranes: intact.   OBJECTIVE:  BP 131/83   Pulse 93   Wt 259 lb (117.5 kg)   LMP 08/31/2022   BMI 48.94 kg/m   Appears well, no apparent distress  No results found for this or any previous visit (from the past 24 hour(s)).  NST: FHR baseline 135 bpm, Variability: moderate, Accelerations:present, Decelerations:  Absent= Cat 1/reactive Toco: none   ASSESSMENT: G2P1001 at [redacted]w[redacted]d with CHTN and Diabetes: A2DM} NST reactive  PLAN: EFM strip reviewed by Cathie Beams, CNM   Recommendations: keep next appointment as scheduled    Jobe Marker  04/23/2023 4:32 PM    Chart reviewed for nurse visit. Agree with plan of care.  Jacklyn Shell, [place] 04/23/2023 4:38 PM

## 2023-04-25 ENCOUNTER — Other Ambulatory Visit: Payer: Self-pay | Admitting: Obstetrics & Gynecology

## 2023-04-25 DIAGNOSIS — O10919 Unspecified pre-existing hypertension complicating pregnancy, unspecified trimester: Secondary | ICD-10-CM

## 2023-04-26 ENCOUNTER — Ambulatory Visit (INDEPENDENT_AMBULATORY_CARE_PROVIDER_SITE_OTHER): Payer: Medicaid Other | Admitting: Obstetrics & Gynecology

## 2023-04-26 ENCOUNTER — Encounter: Payer: Self-pay | Admitting: Obstetrics & Gynecology

## 2023-04-26 ENCOUNTER — Ambulatory Visit (INDEPENDENT_AMBULATORY_CARE_PROVIDER_SITE_OTHER): Payer: Medicaid Other

## 2023-04-26 VITALS — BP 118/75 | HR 102 | Wt 256.2 lb

## 2023-04-26 DIAGNOSIS — Z3A34 34 weeks gestation of pregnancy: Secondary | ICD-10-CM

## 2023-04-26 DIAGNOSIS — O0990 Supervision of high risk pregnancy, unspecified, unspecified trimester: Secondary | ICD-10-CM

## 2023-04-26 DIAGNOSIS — I10 Essential (primary) hypertension: Secondary | ICD-10-CM

## 2023-04-26 DIAGNOSIS — O10913 Unspecified pre-existing hypertension complicating pregnancy, third trimester: Secondary | ICD-10-CM

## 2023-04-26 DIAGNOSIS — Z98891 History of uterine scar from previous surgery: Secondary | ICD-10-CM

## 2023-04-26 DIAGNOSIS — O35EXX0 Maternal care for other (suspected) fetal abnormality and damage, fetal genitourinary anomalies, not applicable or unspecified: Secondary | ICD-10-CM

## 2023-04-26 DIAGNOSIS — O0993 Supervision of high risk pregnancy, unspecified, third trimester: Secondary | ICD-10-CM

## 2023-04-26 DIAGNOSIS — O24419 Gestational diabetes mellitus in pregnancy, unspecified control: Secondary | ICD-10-CM

## 2023-04-26 DIAGNOSIS — O10919 Unspecified pre-existing hypertension complicating pregnancy, unspecified trimester: Secondary | ICD-10-CM

## 2023-04-26 NOTE — Progress Notes (Signed)
HIGH-RISK PREGNANCY VISIT Patient name: Allison Garcia MRN [id number]  Date of birth: 06-19-1991 Chief Complaint:   Routine Prenatal Visit  History of Present Illness:   Allison Garcia is a 32 y.o. G60P1001 female at [redacted]w[redacted]d with an Estimated Date of Delivery: 06/07/23 being seen today for ongoing management of a high-risk pregnancy complicated by:  Chronic HTN-Labetalol 100mg  bid GDMA2- last visit, increased to metformin 1000mg  at bedtime Prior C-section- planned repeat with BTL Obesity RPD  Today she reports  notes some irregular contractions that have now resolved .   Contractions: Irregular. Vag. Bleeding: None.  Movement: Present. denies leaking of fluid.      03/08/2023    8:00 PM 12/04/2022    9:59 AM  Depression screen PHQ 2/9  Decreased Interest 0 1  Down, Depressed, Hopeless 0 0  PHQ - 2 Score 0 1  Tired, decreased energy  2  Change in appetite  1  Feeling bad or failure about yourself   0  Trouble concentrating  0  Moving slowly or fidgety/restless  0  Suicidal thoughts  0     Current Outpatient Medications  Medication Instructions   Accu-Chek Softclix Lancets lancets Use as instructed to check blood sugar 4 times daily   acetaminophen (TYLENOL) 1,000 mg, Oral, Every 6 hours PRN   albuterol (VENTOLIN HFA) 108 (90 Base) MCG/ACT inhaler 1-2 puffs, Inhalation, Every 6 hours PRN   aspirin EC 162 mg, Oral, Daily, Swallow whole.   Blood Glucose Monitoring Suppl (ACCU-CHEK GUIDE ME) w/Device KIT 1 each, Does not apply, 4 times daily   Blood Pressure Monitor MISC For regular home bp monitoring during pregnancy   Doxylamine-Pyridoxine (DICLEGIS) 10-10 MG TBEC 2 tabs q hs, if sx persist add 1 tab q am on day 3, if sx persist add 1 tab q afternoon on day 4   famotidine (PEPCID) 20 mg, Oral, 2 times daily   glucose blood (ACCU-CHEK GUIDE) test strip Use as instructed to check blood sugar 4 times daily   labetalol (NORMODYNE) 100 mg, Oral, 2 times daily   labetalol (NORMODYNE)  100 mg, Oral, 2 times daily   metFORMIN (GLUCOPHAGE) 1,000 mg, Oral, Daily at bedtime   Prenatal Vit-Fe Fumarate-FA (PRENATAL VITAMIN PO) Oral     Review of Systems:   Pertinent items are noted in HPI Denies abnormal vaginal discharge w/ itching/odor/irritation, headaches, visual changes, shortness of breath, chest pain, abdominal pain, severe nausea/vomiting, or problems with urination or bowel movements unless otherwise stated above. Pertinent History Reviewed:  Reviewed past medical,surgical, social, obstetrical and family history.  Reviewed problem list, medications and allergies. Physical Assessment:   Vitals:   04/26/23 1446  BP: 118/75  Pulse: (!) 102  Weight: 256 lb 3.2 oz (116.2 kg)  Body mass index is 48.41 kg/m.           Physical Examination:   General appearance: alert, well appearing, and in no distress  Mental status: normal mood, behavior, speech, dress, motor activity, and thought processes  Skin: warm & dry   Extremities: Edema: None    Cardiovascular: normal heart rate noted  Respiratory: normal respiratory effort, no distress  Abdomen: gravid, soft, non-tender  Pelvic: Cervical exam deferred         Fetal Status:     Movement: Present    Fetal Surveillance Testing today: cephalic,BPP 8/8,FHR 140 bpm,posterior placenta gr 3,AFI 22 cm,RI .61,.58,.66=61%    Chaperone: N/A    No results found for this or any previous  visit (from the past 24 hour(s)).   Assessment & Plan:  High-risk pregnancy: G2P1001 at [redacted]w[redacted]d with an Estimated Date of Delivery: 06/07/23   1) GDMA2 -fastings somewhat improved, continue with 2 tabs at night   2) Chronic HTN- BP stable -growth q 4wks -antepartum testing twice weekly Normal BPP and dopplers today  3) Prior C-section -repeat with BS scheduled for 9/16  -morbid obesity -fetal RPD []  neonatal follow up  Meds: No orders of the defined types were placed in this encounter.   Labs/procedures today:  BPP/doppler  Treatment Plan:  pt given information regarding Tdap, as outlined above  Reviewed: Preterm labor symptoms and general obstetric precautions including but not limited to vaginal bleeding, contractions, leaking of fluid and fetal movement were reviewed in detail with the patient.  All questions were answered. PT has home bp cuff. Check bp weekly, let us know if >140/90.   Follow-up: No follow-ups on file.   Future Appointments  Date Time Provider Department Center  04/26/2023  3:10 PM Myna Hidalgo, [place] [facility]  04/30/2023  3:30 PM [facility] NURSE [facility]  05/03/2023  8:30 AM [facility] [place] [facility] None  05/03/2023  9:30 AM Myna Hidalgo, [place] [facility]  05/10/2023  8:30 AM [facility] [place] [facility] None  05/10/2023  9:30 AM Myna Hidalgo, DO [facility]  05/14/2023  3:30 PM [facility] NURSE [facility]  05/17/2023  8:30 AM [facility] [place] [facility] None  05/17/2023  9:30 AM Myna Hidalgo, DO [facility]  05/21/2023  3:30 PM [facility] NURSE [facility]  05/24/2023  3:00 PM [facility] [place] [facility] None  05/24/2023  3:50 PM Eure, Amaryllis Dyke, MD [facility]    No orders of the defined types were placed in this encounter.   Myna Hidalgo, DO Attending Obstetrician & Gynecologist, [facility] for [employer], [facility]

## 2023-04-26 NOTE — Progress Notes (Signed)
[place] 34 wks,cephalic,BPP 8/8,FHR 140 bpm,posterior placenta gr 3,AFI 22 cm,RI .61,.58,.66=61%

## 2023-04-30 ENCOUNTER — Ambulatory Visit (INDEPENDENT_AMBULATORY_CARE_PROVIDER_SITE_OTHER): Payer: Medicaid Other | Admitting: *Deleted

## 2023-04-30 VITALS — BP 135/91 | HR 102 | Wt 257.3 lb

## 2023-04-30 DIAGNOSIS — Z3A34 34 weeks gestation of pregnancy: Secondary | ICD-10-CM

## 2023-04-30 DIAGNOSIS — O0990 Supervision of high risk pregnancy, unspecified, unspecified trimester: Secondary | ICD-10-CM

## 2023-04-30 DIAGNOSIS — I10 Essential (primary) hypertension: Secondary | ICD-10-CM

## 2023-04-30 DIAGNOSIS — O24419 Gestational diabetes mellitus in pregnancy, unspecified control: Secondary | ICD-10-CM | POA: Diagnosis not present

## 2023-04-30 DIAGNOSIS — Z331 Pregnant state, incidental: Secondary | ICD-10-CM

## 2023-04-30 DIAGNOSIS — Z1389 Encounter for screening for other disorder: Secondary | ICD-10-CM

## 2023-04-30 DIAGNOSIS — O0993 Supervision of high risk pregnancy, unspecified, third trimester: Secondary | ICD-10-CM

## 2023-04-30 LAB — POCT URINALYSIS DIPSTICK OB
Blood, UA: NEGATIVE
Glucose, UA: NEGATIVE
Ketones, UA: NEGATIVE
Leukocytes, UA: NEGATIVE
Nitrite, UA: NEGATIVE
POC,PROTEIN,UA: NEGATIVE

## 2023-04-30 NOTE — Progress Notes (Signed)
   NURSE VISIT- NST  SUBJECTIVE:  Allison Garcia is a 32 y.o. G85P1001 female at [redacted]w[redacted]d, here for a NST for pregnancy complicated by [facility] and Diabetes: A2DM} on Metformin  She reports active fetal movement, contractions: irritability, vaginal bleeding: none, membranes: intact.   OBJECTIVE:  BP (!) 135/91   Pulse (!) 102   Wt 257 lb 4.8 oz (116.7 kg)   LMP 08/31/2022   BMI 48.62 kg/m   Appears well, no apparent distress  No results found for this or any previous visit (from the past 24 hour(s)).  NST: FHR baseline 145 bpm, Variability: moderate, Accelerations:present, Decelerations:  Absent= Cat 1/reactive Toco: none   ASSESSMENT: G2P1001 at [redacted]w[redacted]d with CHTN and Diabetes: A2DM} on Metformin NST reactive  PLAN: EFM strip reviewed by Joellyn Haff, CNM, [facility]   Recommendations: keep next appointment as scheduled    Jobe Marker  04/30/2023 4:49 PM

## 2023-05-02 ENCOUNTER — Other Ambulatory Visit: Payer: Self-pay | Admitting: Obstetrics & Gynecology

## 2023-05-02 DIAGNOSIS — O10919 Unspecified pre-existing hypertension complicating pregnancy, unspecified trimester: Secondary | ICD-10-CM

## 2023-05-03 ENCOUNTER — Ambulatory Visit (INDEPENDENT_AMBULATORY_CARE_PROVIDER_SITE_OTHER): Payer: Medicaid Other

## 2023-05-03 ENCOUNTER — Encounter: Payer: Self-pay | Admitting: Women's Health

## 2023-05-03 ENCOUNTER — Ambulatory Visit (INDEPENDENT_AMBULATORY_CARE_PROVIDER_SITE_OTHER): Payer: Medicaid Other | Admitting: Women's Health

## 2023-05-03 VITALS — BP 117/75 | HR 89 | Wt 255.3 lb

## 2023-05-03 DIAGNOSIS — O0993 Supervision of high risk pregnancy, unspecified, third trimester: Secondary | ICD-10-CM

## 2023-05-03 DIAGNOSIS — O0990 Supervision of high risk pregnancy, unspecified, unspecified trimester: Secondary | ICD-10-CM

## 2023-05-03 DIAGNOSIS — Z331 Pregnant state, incidental: Secondary | ICD-10-CM

## 2023-05-03 DIAGNOSIS — Z3A35 35 weeks gestation of pregnancy: Secondary | ICD-10-CM | POA: Diagnosis not present

## 2023-05-03 DIAGNOSIS — Z1389 Encounter for screening for other disorder: Secondary | ICD-10-CM

## 2023-05-03 DIAGNOSIS — O10913 Unspecified pre-existing hypertension complicating pregnancy, third trimester: Secondary | ICD-10-CM

## 2023-05-03 DIAGNOSIS — Z98891 History of uterine scar from previous surgery: Secondary | ICD-10-CM

## 2023-05-03 DIAGNOSIS — O10919 Unspecified pre-existing hypertension complicating pregnancy, unspecified trimester: Secondary | ICD-10-CM

## 2023-05-03 DIAGNOSIS — O24419 Gestational diabetes mellitus in pregnancy, unspecified control: Secondary | ICD-10-CM

## 2023-05-03 LAB — POCT URINALYSIS DIPSTICK OB
Blood, UA: NEGATIVE
Glucose, UA: NEGATIVE
Ketones, UA: NEGATIVE
Nitrite, UA: NEGATIVE
POC,PROTEIN,UA: NEGATIVE

## 2023-05-03 NOTE — Progress Notes (Signed)
HIGH-RISK PREGNANCY VISIT Patient name: Allison Garcia MRN [id number]  Date of birth: 1991-08-24 Chief Complaint:   Routine Prenatal Visit and Pregnancy Ultrasound  History of Present Illness:   SHEREA FARNELL is a 32 y.o. G71P1001 female at [redacted]w[redacted]d with an Estimated Date of Delivery: 06/07/23 being seen today for ongoing management of a high-risk pregnancy complicated by chronic hypertension currently on labetalol 100mg  BID and diabetes mellitus A2DM currently on metformin 1000mg  qhs .    Today she reports  woke up stuffy this am. FBS all <95 but 2, 103 & 110- 110 wasn't true fasting, had heart burn bad during night and got up and drank milk; 2hr pp 85-154 (7 of 19 >120), states she knows it is a lot of what she is eating, not a lot of time in evenings- has to fix quick food/drivethru . Contractions: Not present.  .  Movement: Present. denies leaking of fluid.      03/08/2023    8:00 PM 12/04/2022    9:59 AM  Depression screen PHQ 2/9  Decreased Interest 0 1  Down, Depressed, Hopeless 0 0  PHQ - 2 Score 0 1  Tired, decreased energy  2  Change in appetite  1  Feeling bad or failure about yourself   0  Trouble concentrating  0  Moving slowly or fidgety/restless  0  Suicidal thoughts  0        12/04/2022    9:59 AM  GAD 7 : Generalized Anxiety Score  Nervous, Anxious, on Edge 0  Control/stop worrying 0  Worry too much - different things 0  Trouble relaxing 0  Restless 1  Easily annoyed or irritable 1  Afraid - awful might happen 0  Total GAD 7 Score 2     Review of Systems:   Pertinent items are noted in HPI Denies abnormal vaginal discharge w/ itching/odor/irritation, headaches, visual changes, shortness of breath, chest pain, abdominal pain, severe nausea/vomiting, or problems with urination or bowel movements unless otherwise stated above. Pertinent History Reviewed:  Reviewed past medical,surgical, social, obstetrical and family history.  Reviewed problem list, medications and  allergies. Physical Assessment:   Vitals:   05/03/23 0903  BP: 117/75  Pulse: 89  Weight: 255 lb 4.8 oz (115.8 kg)  Body mass index is 48.24 kg/m.           Physical Examination:   General appearance: alert, well appearing, and in no distress  Mental status: alert, oriented to person, place, and time  Skin: warm & dry   Extremities: Edema: None    Cardiovascular: normal heart rate noted  Respiratory: normal respiratory effort, no distress  Abdomen: gravid, soft, non-tender  Pelvic: Cervical exam deferred         Fetal Status:     Movement: Present    Fetal Surveillance Testing today: [place] 35 wks,cephalic,BPP 8/8,FHR 133 bpm,AFI 20 cm,posterior placenta gr 3,RI .53,.55.52=28%   Chaperone: N/A    Results for orders placed or performed in visit on 05/03/23 (from the past 24 hour(s))  POC Urinalysis Dipstick OB   Collection Time: 05/03/23  9:25 AM  Result Value Ref Range   Color, UA     Clarity, UA     Glucose, UA Negative Negative   Bilirubin, UA     Ketones, UA negative    Spec Grav, UA     Blood, UA negative    pH, UA     POC,PROTEIN,UA Negative Negative, Trace, Small (1+), Moderate (2+), Large (  3+), 4+   Urobilinogen, UA     Nitrite, UA negative    Leukocytes, UA Trace (A) Negative   Appearance     Odor      Assessment & Plan:  High-risk pregnancy: G2P1001 at [redacted]w[redacted]d with an Estimated Date of Delivery: 06/07/23   1) CHTN w/ h/o severe pre-e, stable on labetalol 100mg  BID, ASA. Reviewed pre-e s/s, reasons to seek care  2) A2DM, add metformin 500mg  qam, continue 1000mg  at bedtime, EFW 83% at 32wk  3) Prev c/s> for RCS w/ BTS  4) PG BMI >=40  Meds: No orders of the defined types were placed in this encounter.   Labs/procedures today: U/S  Treatment Plan:  2x/wk testing, RCS @ 39w  Reviewed: Preterm labor symptoms and general obstetric precautions including but not limited to vaginal bleeding, contractions, leaking of fluid and fetal movement were reviewed in  detail with the patient.  All questions were answered. Does have home bp cuff. Office bp cuff given: not applicable. Check bp daily, let us know if consistently >140 and/or >90.  Follow-up: Return for As scheduled.   Future Appointments  Date Time Provider Department Center  05/10/2023  8:30 AM [facility] - [facility] [place] [facility] None  05/10/2023  9:30 AM Myna Hidalgo, DO [facility]  05/14/2023  3:30 PM [facility] NURSE [facility]  05/17/2023  8:30 AM [facility] [place] [facility] None  05/17/2023  9:30 AM Myna Hidalgo, DO [facility]  05/21/2023  3:30 PM [facility] NURSE [facility]  05/24/2023  3:00 PM [facility] [place] [facility] None  05/24/2023  3:50 PM Eure, Amaryllis Dyke, MD [facility]    Orders Placed This Encounter  Procedures   POC Urinalysis Dipstick OB   Cheral Marker CNM, [facility] 05/03/2023 9:46 AM

## 2023-05-03 NOTE — Patient Instructions (Signed)
Allison Garcia, thank you for choosing our office today! We appreciate the opportunity to meet your healthcare needs. You may receive a short survey by mail, e-mail, or through MyChart. If you are happy with your care we would appreciate if you could take just a few minutes to complete the survey questions. We read all of your comments and take your feedback very seriously. Thank you again for choosing our office.  [facility] [place] ([address] [place], [place] [address]) Entrance C, located off of [address] Free 24/7 valet parking   CLASSES: Go to [email or web address] to register for classes (childbirth, breastfeeding, waterbirth, infant CPR, daddy bootcamp, etc.)  Call the office ([phone]) or go to [facility] if: You begin to have strong, frequent contractions Your water breaks.  Sometimes it is a big gush of fluid, sometimes it is just a trickle that keeps getting your panties wet or running down your legs You have vaginal bleeding.  It is normal to have a small amount of spotting if your cervix was checked.  You don't feel your baby moving like normal.  If you don't, get you something to eat and drink and lay down and focus on feeling your baby move.   If your baby is still not moving like normal, you should call the office or go to [facility].  Call the office ([phone]) or go to [facility] for these signs of pre-eclampsia: Severe headache that does not go away with Tylenol Visual changes- seeing spots, double, blurred vision Pain under your right breast or upper abdomen that does not go away with Tums or heartburn medicine Nausea and/or vomiting Severe swelling in your hands, feet, and face   Tdap Vaccine It is recommended that you get the Tdap vaccine during the third trimester of EACH pregnancy to help protect your baby from getting pertussis (whooping cough) 27-36 weeks is the BEST time to do  this so that you can pass the protection on to your baby. During pregnancy is better than after pregnancy, but if you are unable to get it during pregnancy it will be offered at the hospital.  You can get this vaccine with us, at the health department, your family doctor, or some local pharmacies Everyone who will be around your baby should also be up-to-date on their vaccines before the baby comes. Adults (who are not pregnant) only need 1 dose of Tdap during adulthood.   [place] Pediatricians/Family Doctors [facility] ([facility]): [address], [phone]           [facility]: [address], [phone]                [facility] ([facility]): [address], [phone] (call to ask if accepting patients) [facility]: [address], [place], [phone]    [place] Pediatricians/Family Doctors [facility] ([facility]): [address], [phone] [facility]: [address], [phone] [facility] [place]: [address], [phone]  [place] Family Doctors  [facility] ([facility]): [phone] [facility]: [address], [phone]   [place] Family Doctors [facility]: [address], [phone]  [place] [facility]  [facility]: [address], [phone]  Home Blood Pressure Monitoring for Patients   Your provider has recommended that you check your   blood pressure (BP) at least once a week at home. If you do not have a blood pressure cuff at home, one will be provided for you. Contact your provider if you have not received your monitor within 1 week.   Helpful Tips for Accurate Home Blood Pressure Checks  Don't smoke, exercise, or drink caffeine 30 minutes before checking your BP Use the restroom before checking your BP (a full bladder can raise your  pressure) Relax in a comfortable upright chair Feet on the ground Left arm resting comfortably on a flat surface at the level of your heart Legs uncrossed Back supported Sit quietly and don't talk Place the cuff on your bare arm Adjust snuggly, so that only two fingertips can fit between your skin and the top of the cuff Check 2 readings separated by at least one minute Keep a log of your BP readings For a visual, please reference this diagram: http://ccnc.care/bpdiagram  Provider Name: [facility]     Phone: [phone]  Zone 1: ALL CLEAR  Continue to monitor your symptoms:  BP reading is less than 140 (top number) or less than 90 (bottom number)  No right upper stomach pain No headaches or seeing spots No feeling nauseated or throwing up No swelling in face and hands  Zone 2: CAUTION Call your doctor's office for any of the following:  BP reading is greater than 140 (top number) or greater than 90 (bottom number)  Stomach pain under your ribs in the middle or right side Headaches or seeing spots Feeling nauseated or throwing up Swelling in face and hands  Zone 3: EMERGENCY  Seek immediate medical care if you have any of the following:  BP reading is greater than160 (top number) or greater than 110 (bottom number) Severe headaches not improving with Tylenol Serious difficulty catching your breath Any worsening symptoms from Zone 2  Preterm Labor and Birth Information  The normal length of a pregnancy is 39-41 weeks. Preterm labor is when labor starts before 37 completed weeks of pregnancy. What are the risk factors for preterm labor? Preterm labor is more likely to occur in women who: Have certain infections during pregnancy such as a bladder infection, sexually transmitted infection, or infection inside the uterus (chorioamnionitis). Have a shorter-than-normal cervix. Have gone into preterm labor before. Have had surgery on their cervix. Are younger than age 17  or older than age 35. Are African American. Are pregnant with twins or multiple babies (multiple gestation). Take street drugs or smoke while pregnant. Do not gain enough weight while pregnant. Became pregnant shortly after having been pregnant. What are the symptoms of preterm labor? Symptoms of preterm labor include: Cramps similar to those that can happen during a menstrual period. The cramps may happen with diarrhea. Pain in the abdomen or lower back. Regular uterine contractions that may feel like tightening of the abdomen. A feeling of increased pressure in the pelvis. Increased watery or bloody mucus discharge from the vagina. Water breaking (ruptured amniotic sac). Why is it important to recognize signs of preterm labor? It is important to recognize signs of preterm labor because babies who are born prematurely may not be fully developed. This can put them at an increased risk for: Long-term (chronic) heart and lung problems. Difficulty immediately after birth with regulating body systems, including blood sugar, body temperature, heart rate, and breathing rate. Bleeding in the brain. Cerebral palsy. Learning difficulties. Death. These risks are highest for babies who are born before 34 weeks   of pregnancy. How is preterm labor treated? Treatment depends on the length of your pregnancy, your condition, and the health of your baby. It may involve: Having a stitch (suture) placed in your cervix to prevent your cervix from opening too early (cerclage). Taking or being given medicines, such as: Hormone medicines. These may be given early in pregnancy to help support the pregnancy. Medicine to stop contractions. Medicines to help mature the baby's lungs. These may be prescribed if the risk of delivery is high. Medicines to prevent your baby from developing cerebral palsy. If the labor happens before 34 weeks of pregnancy, you may need to stay in the hospital. What should I do if I  think I am in preterm labor? If you think that you are going into preterm labor, call your health care provider right away. How can I prevent preterm labor in future pregnancies? To increase your chance of having a full-term pregnancy: Do not use any tobacco products, such as cigarettes, chewing tobacco, and e-cigarettes. If you need help quitting, ask your health care provider. Do not use street drugs or medicines that have not been prescribed to you during your pregnancy. Talk with your health care provider before taking any herbal supplements, even if you have been taking them regularly. Make sure you gain a healthy amount of weight during your pregnancy. Watch for infection. If you think that you might have an infection, get it checked right away. Make sure to tell your health care provider if you have gone into preterm labor before. This information is not intended to replace advice given to you by your health care provider. Make sure you discuss any questions you have with your health care provider. Document Revised: 12/13/2018 Document Reviewed: 01/12/2016 Elsevier Patient Education  2020 [employer]   

## 2023-05-03 NOTE — Progress Notes (Signed)
[place] 35 wks,cephalic,BPP 8/8,FHR 133 bpm,AFI 20 cm,posterior placenta gr 3,RI .53,.55.52=28%

## 2023-05-08 ENCOUNTER — Telehealth (HOSPITAL_COMMUNITY): Payer: Self-pay | Admitting: *Deleted

## 2023-05-08 ENCOUNTER — Encounter (HOSPITAL_COMMUNITY): Payer: Self-pay

## 2023-05-08 NOTE — Patient Instructions (Signed)
ANNITA STANCHFIELD  05/08/2023   Your procedure is scheduled on:  05/21/2023  Arrive at 1015 at Entrance C on [employer] at [facility]  and [employer]. You are invited to use the FREE valet parking or use the Visitor's parking deck.  Pick up the phone at the desk and dial [phone].  Call this number if you have problems the morning of surgery: [phone]  Remember:   Do not eat food:(After Midnight) Desps de medianoche.  Do not drink clear liquids: (After Midnight) Desps de medianoche.  Take these medicines the morning of surgery with A SIP OF WATER:  Labetalol and bring your inhaler with you please   Do not wear jewelry, make-up or nail polish.  Do not wear lotions, powders, or perfumes. Do not wear deodorant.  Do not shave 48 hours prior to surgery.  Do not bring valuables to the hospital.  [facility] is not   responsible for any belongings or valuables brought to the hospital.  Contacts, dentures or bridgework may not be worn into surgery.  Leave suitcase in the car. After surgery it may be brought to your room.  For patients admitted to the hospital, checkout time is 11:00 AM the day of              discharge.      Please read over the following fact sheets that you were given:     Preparing for Surgery

## 2023-05-08 NOTE — Telephone Encounter (Signed)
Preadmission screen  

## 2023-05-10 ENCOUNTER — Encounter: Payer: Self-pay | Admitting: Advanced Practice Midwife

## 2023-05-10 ENCOUNTER — Encounter (HOSPITAL_COMMUNITY): Payer: Self-pay

## 2023-05-10 ENCOUNTER — Ambulatory Visit (INDEPENDENT_AMBULATORY_CARE_PROVIDER_SITE_OTHER): Payer: Medicaid Other | Admitting: Advanced Practice Midwife

## 2023-05-10 ENCOUNTER — Other Ambulatory Visit (HOSPITAL_COMMUNITY)
Admission: RE | Admit: 2023-05-10 | Discharge: 2023-05-10 | Disposition: A | Discharge status: Home / Self Care | Payer: Medicaid Other | Source: Ambulatory Visit | Attending: Obstetrics & Gynecology | Admitting: Obstetrics & Gynecology

## 2023-05-10 ENCOUNTER — Ambulatory Visit (INDEPENDENT_AMBULATORY_CARE_PROVIDER_SITE_OTHER): Payer: Medicaid Other

## 2023-05-10 ENCOUNTER — Other Ambulatory Visit: Payer: Self-pay | Admitting: Obstetrics & Gynecology

## 2023-05-10 VITALS — BP 120/76 | HR 87 | Wt 253.0 lb

## 2023-05-10 DIAGNOSIS — I10 Essential (primary) hypertension: Secondary | ICD-10-CM

## 2023-05-10 DIAGNOSIS — O0993 Supervision of high risk pregnancy, unspecified, third trimester: Secondary | ICD-10-CM

## 2023-05-10 DIAGNOSIS — Z98891 History of uterine scar from previous surgery: Secondary | ICD-10-CM

## 2023-05-10 DIAGNOSIS — Z3A36 36 weeks gestation of pregnancy: Secondary | ICD-10-CM | POA: Diagnosis not present

## 2023-05-10 DIAGNOSIS — O0990 Supervision of high risk pregnancy, unspecified, unspecified trimester: Secondary | ICD-10-CM

## 2023-05-10 DIAGNOSIS — Z3493 Encounter for supervision of normal pregnancy, unspecified, third trimester: Secondary | ICD-10-CM | POA: Diagnosis not present

## 2023-05-10 DIAGNOSIS — O10919 Unspecified pre-existing hypertension complicating pregnancy, unspecified trimester: Secondary | ICD-10-CM

## 2023-05-10 DIAGNOSIS — Z3483 Encounter for supervision of other normal pregnancy, third trimester: Secondary | ICD-10-CM | POA: Insufficient documentation

## 2023-05-10 DIAGNOSIS — O24415 Gestational diabetes mellitus in pregnancy, controlled by oral hypoglycemic drugs: Secondary | ICD-10-CM

## 2023-05-10 NOTE — Progress Notes (Signed)
HIGH-RISK PREGNANCY VISIT Patient name: Allison Garcia MRN [id number]  Date of birth: 1991-02-04 Chief Complaint:   Routine Prenatal Visit (culture) and Pregnancy Ultrasound  History of Present Illness:   Allison Garcia is a 32 y.o. G63P1001 female at [redacted]w[redacted]d with an Estimated Date of Delivery: 06/07/23 being seen today for ongoing management of a high-risk pregnancy complicated by chronic hypertension currently on labetalol 100mg  BID and diabetes mellitus A2DM currently on Metformin 500mg  am and 1000mg  pm .    Today she reports FBS all <96 (only 1 @ 96) and 2hr pp all normal except a couple and they were diet related. Contractions: Not present.  .  Movement: Present. denies leaking of fluid.      03/08/2023    8:00 PM 12/04/2022    9:59 AM  Depression screen PHQ 2/9  Decreased Interest 0 1  Down, Depressed, Hopeless 0 0  PHQ - 2 Score 0 1  Tired, decreased energy  2  Change in appetite  1  Feeling bad or failure about yourself   0  Trouble concentrating  0  Moving slowly or fidgety/restless  0  Suicidal thoughts  0        12/04/2022    9:59 AM  GAD 7 : Generalized Anxiety Score  Nervous, Anxious, on Edge 0  Control/stop worrying 0  Worry too much - different things 0  Trouble relaxing 0  Restless 1  Easily annoyed or irritable 1  Afraid - awful might happen 0  Total GAD 7 Score 2     Review of Systems:   Pertinent items are noted in HPI Denies abnormal vaginal discharge w/ itching/odor/irritation, headaches, visual changes, shortness of breath, chest pain, abdominal pain, severe nausea/vomiting, or problems with urination or bowel movements unless otherwise stated above. Pertinent History Reviewed:  Reviewed past medical,surgical, social, obstetrical and family history.  Reviewed problem list, medications and allergies. Physical Assessment:   Vitals:   05/10/23 0913  BP: 120/76  Pulse: 87  Weight: 253 lb (114.8 kg)  Body mass index is 47.8 kg/m.           Physical  Examination:   General appearance: alert, well appearing, and in no distress  Mental status: alert, oriented to person, place, and time  Skin: warm & dry   Extremities: Edema: None    Cardiovascular: normal heart rate noted  Respiratory: normal respiratory effort, no distress  Abdomen: gravid, soft, non-tender  Pelvic: Cervical exam performed         Fetal Status:     Movement: Present    Fetal Surveillance Testing today: [place] 36 wks,cephalic,BPP 8/8, posterior placenta gr 3,FHR 138 bpm,AFI 22 cm,EFW 33712 G 93%,RI .58,.47,.46=22%    Chaperone: Peggy Dones    No results found for this or any previous visit (from the past 24 hour(s)).  Assessment & Plan:  High-risk pregnancy: G2P1001 at [redacted]w[redacted]d with an Estimated Date of Delivery: 06/07/23   1. Supervision of high risk pregnancy, antepartum   2. [redacted] weeks gestation of pregnancy  - Culture, beta strep (group b only) - Cervicovaginal ancillary only( )  3. Chronic hypertension Continue labetalol 100mg  BOD  4. Gestational diabetes mellitus (GDM) in third trimester controlled on oral hypoglycemic drug Continue metformin 500/1000  5. History of cesarean delivery Repeat CS w/BTL on 9-16    Meds: No orders of the defined types were placed in this encounter.   Orders:  Orders Placed This Encounter  Procedures   Culture,  beta strep (group b only)     Labs/procedures today: GBS, GC/CT, and SVE   Reviewed: Term labor symptoms and general obstetric precautions including but not limited to vaginal bleeding, contractions, leaking of fluid and fetal movement were reviewed in detail with the patient.  All questions were answered. Does have home bp cuff. Office bp cuff given: not applicable. Check bp daily, let us know if consistently >140 and/or >90.  Follow-up: No follow-ups on file.   Future Appointments  Date Time Provider Department Center  05/14/2023  3:30 PM [facility] NURSE [facility]  05/17/2023  8:30 AM [facility] [place] [facility] None  05/17/2023  9:30 AM Myna Hidalgo, DO [facility]  05/18/2023  9:30 AM [facility] PAT 1 [facility] None  05/21/2023  3:30 PM [facility] NURSE [facility]  05/24/2023  3:00 PM [facility] [place] [facility] None  05/24/2023  3:50 PM Eure, Amaryllis Dyke, MD [facility]    Orders Placed This Encounter  Procedures   Culture, beta strep (group b only)   Jacklyn Shell , DNP, CNM [facility] 05/10/2023 9:37 AM

## 2023-05-10 NOTE — Progress Notes (Signed)
[place] 36 wks,cephalic,BPP 8/8, posterior placenta gr 3,FHR 138 bpm,AFI 22 cm,EFW 33712 G 93%,RI .58,.47,.46=22%

## 2023-05-11 DIAGNOSIS — Z3483 Encounter for supervision of other normal pregnancy, third trimester: Secondary | ICD-10-CM | POA: Diagnosis not present

## 2023-05-11 DIAGNOSIS — Z3482 Encounter for supervision of other normal pregnancy, second trimester: Secondary | ICD-10-CM | POA: Diagnosis not present

## 2023-05-11 LAB — CERVICOVAGINAL ANCILLARY ONLY
Chlamydia: NEGATIVE
Comment: NEGATIVE
Comment: NORMAL
Neisseria Gonorrhea: NEGATIVE

## 2023-05-14 ENCOUNTER — Ambulatory Visit (INDEPENDENT_AMBULATORY_CARE_PROVIDER_SITE_OTHER): Payer: Medicaid Other | Admitting: *Deleted

## 2023-05-14 VITALS — BP 141/80 | HR 84

## 2023-05-14 DIAGNOSIS — O0993 Supervision of high risk pregnancy, unspecified, third trimester: Secondary | ICD-10-CM | POA: Diagnosis not present

## 2023-05-14 DIAGNOSIS — O0990 Supervision of high risk pregnancy, unspecified, unspecified trimester: Secondary | ICD-10-CM

## 2023-05-14 DIAGNOSIS — I10 Essential (primary) hypertension: Secondary | ICD-10-CM | POA: Diagnosis not present

## 2023-05-14 DIAGNOSIS — O24415 Gestational diabetes mellitus in pregnancy, controlled by oral hypoglycemic drugs: Secondary | ICD-10-CM

## 2023-05-14 DIAGNOSIS — Z3A36 36 weeks gestation of pregnancy: Secondary | ICD-10-CM

## 2023-05-14 LAB — CULTURE, BETA STREP (GROUP B ONLY): Strep Gp B Culture: NEGATIVE

## 2023-05-14 NOTE — Progress Notes (Signed)
   NURSE VISIT- NST  SUBJECTIVE:  Allison Garcia is a 32 y.o. G67P1001 female at [redacted]w[redacted]d, here for a NST for pregnancy complicated by [facility] and Diabetes: A2DM}.  She reports active fetal movement, contractions: irregular, vaginal bleeding: none, membranes: intact.   OBJECTIVE:  BP (!) 141/80   Pulse 84   LMP 08/31/2022   Appears well, no apparent distress  No results found for this or any previous visit (from the past 24 hour(s)).  NST: FHR baseline 140 bpm, Variability: moderate, Accelerations:present, Decelerations:  Absent= Cat 1/reactive Toco: occasional   ASSESSMENT: G2P1001 at [redacted]w[redacted]d with CHTN and Diabetes: A2DM} NST reactive  PLAN: EFM strip reviewed by Dr. Charlotta Newton   Recommendations: keep next appointment as scheduled    Jobe Marker  05/14/2023 4:31 PM

## 2023-05-16 ENCOUNTER — Other Ambulatory Visit: Payer: Self-pay | Admitting: Obstetrics & Gynecology

## 2023-05-16 DIAGNOSIS — I10 Essential (primary) hypertension: Secondary | ICD-10-CM

## 2023-05-16 DIAGNOSIS — O0990 Supervision of high risk pregnancy, unspecified, unspecified trimester: Secondary | ICD-10-CM

## 2023-05-17 ENCOUNTER — Ambulatory Visit (INDEPENDENT_AMBULATORY_CARE_PROVIDER_SITE_OTHER): Payer: Medicaid Other | Admitting: Obstetrics & Gynecology

## 2023-05-17 ENCOUNTER — Encounter: Payer: Self-pay | Admitting: Obstetrics & Gynecology

## 2023-05-17 ENCOUNTER — Ambulatory Visit (INDEPENDENT_AMBULATORY_CARE_PROVIDER_SITE_OTHER): Payer: Medicaid Other

## 2023-05-17 VITALS — BP 119/77 | HR 101 | Wt 253.4 lb

## 2023-05-17 DIAGNOSIS — O0990 Supervision of high risk pregnancy, unspecified, unspecified trimester: Secondary | ICD-10-CM

## 2023-05-17 DIAGNOSIS — I10 Essential (primary) hypertension: Secondary | ICD-10-CM

## 2023-05-17 DIAGNOSIS — O35EXX0 Maternal care for other (suspected) fetal abnormality and damage, fetal genitourinary anomalies, not applicable or unspecified: Secondary | ICD-10-CM

## 2023-05-17 DIAGNOSIS — O10919 Unspecified pre-existing hypertension complicating pregnancy, unspecified trimester: Secondary | ICD-10-CM

## 2023-05-17 DIAGNOSIS — O0993 Supervision of high risk pregnancy, unspecified, third trimester: Secondary | ICD-10-CM

## 2023-05-17 DIAGNOSIS — Z98891 History of uterine scar from previous surgery: Secondary | ICD-10-CM

## 2023-05-17 DIAGNOSIS — Z3A37 37 weeks gestation of pregnancy: Secondary | ICD-10-CM | POA: Diagnosis not present

## 2023-05-17 DIAGNOSIS — O24415 Gestational diabetes mellitus in pregnancy, controlled by oral hypoglycemic drugs: Secondary | ICD-10-CM

## 2023-05-17 NOTE — Progress Notes (Signed)
[place] 37 wks,cephalic,posterior placenta gr 3,BPP 8/8,AFI 22 cm,FHR 130 bpm,RI .47,.60,.57=46%

## 2023-05-17 NOTE — Progress Notes (Signed)
HIGH-RISK PREGNANCY VISIT Patient name: Allison Garcia MRN [id number]  Date of birth: 1991-05-29 Chief Complaint:   Routine Prenatal Visit  History of Present Illness:   Allison Garcia is a 32 y.o. G42P1001 female at [redacted]w[redacted]d with an Estimated Date of Delivery: 06/07/23 being seen today for ongoing management of a high-risk pregnancy complicated by:  -Chronic HTN: on Labetalol Machine at home broken Currently aysmptomtatic -GDMA2- metformin 500mg  in am, 1000mg  at night Sugar reviewed wnl Prior C-section Fetal renal pelvic dilation  Today she reports  she had one episode of mucus-like spotting a few days ago that resolved.  Denies bleeding or any issues currently .   Contractions: Not present. Vag. Bleeding: None.  Movement: Present. denies leaking of fluid.      03/08/2023    8:00 PM 12/04/2022    9:59 AM  Depression screen PHQ 2/9  Decreased Interest 0 1  Down, Depressed, Hopeless 0 0  PHQ - 2 Score 0 1  Tired, decreased energy  2  Change in appetite  1  Feeling bad or failure about yourself   0  Trouble concentrating  0  Moving slowly or fidgety/restless  0  Suicidal thoughts  0     Current Outpatient Medications  Medication Instructions   Accu-Chek Softclix Lancets lancets Use as instructed to check blood sugar 4 times daily   acetaminophen (TYLENOL) 1,000 mg, Oral, Every 6 hours PRN   albuterol (VENTOLIN HFA) 108 (90 Base) MCG/ACT inhaler 1-2 puffs, Inhalation, Every 6 hours PRN   aspirin EC 162 mg, Oral, Daily, Swallow whole.   Blood Glucose Monitoring Suppl (ACCU-CHEK GUIDE ME) w/Device KIT 1 each, Does not apply, 4 times daily   Blood Pressure Monitor MISC For regular home bp monitoring during pregnancy   calcium carbonate (TUMS EX) 750 MG chewable tablet 2 tablets, Oral, Daily PRN   docusate sodium (COLACE) 100 mg, Oral, Daily PRN   Doxylamine-Pyridoxine (DICLEGIS) 10-10 MG TBEC 2 tabs q hs, if sx persist add 1 tab q am on day 3, if sx persist add 1 tab q afternoon on  day 4   famotidine (PEPCID) 20 mg, Oral, 2 times daily   glucose blood (ACCU-CHEK GUIDE) test strip Use as instructed to check blood sugar 4 times daily   labetalol (NORMODYNE) 100 mg, Oral, 2 times daily   metFORMIN (GLUCOPHAGE) 1,000 mg, Oral, Daily at bedtime   nystatin cream (MYCOSTATIN) 1 Application, Topical, 2 times daily   Prenatal Vit-Fe Fumarate-FA (PRENATAL VITAMIN PO) 1 tablet, Oral, Daily     Review of Systems:   Pertinent items are noted in HPI Denies abnormal vaginal discharge w/ itching/odor/irritation, headaches, visual changes, shortness of breath, chest pain, abdominal pain, severe nausea/vomiting, or problems with urination or bowel movements unless otherwise stated above. Pertinent History Reviewed:  Reviewed past medical,surgical, social, obstetrical and family history.  Reviewed problem list, medications and allergies. Physical Assessment:   Vitals:   05/17/23 0910  BP: 119/77  Pulse: (!) 101  Weight: 253 lb 6.4 oz (114.9 kg)  Body mass index is 47.88 kg/m.           Physical Examination:   General appearance: alert, well appearing, and in no distress  Mental status: normal mood, behavior, speech, dress, motor activity, and thought processes  Skin: warm & dry   Extremities:      Cardiovascular: normal heart rate noted  Respiratory: normal respiratory effort, no distress  Abdomen: gravid, soft, non-tender  Pelvic: Cervical exam deferred  Fetal Status:     Movement: Present    Fetal Surveillance Testing today: cephalic,posterior placenta gr 3,BPP 8/8,AFI 22 cm,FHR 130 bpm,RI .47,.60,.57=46%    Chaperone: N/A    No results found for this or any previous visit (from the past 24 hour(s)).   Assessment & Plan:  High-risk pregnancy: G2P1001 at [redacted]w[redacted]d with an Estimated Date of Delivery: 06/07/23   -Chronic HTN: on Labetalol Continue with current meds Ok to stop ASA a few days before surgery Normal testing today  -GDMA2- metformin 500mg  in am,  1000mg  at night  Prior C-section Fetal renal pelvic dilation  Meds: No orders of the defined types were placed in this encounter.   Labs/procedures today: BPP/doppler  Treatment Plan:  scheduled for delivery on Monday, reviewed precautions as below  Reviewed: Term labor symptoms and general obstetric precautions including but not limited to vaginal bleeding, contractions, leaking of fluid and fetal movement were reviewed in detail with the patient.  All questions were answered. Pt has home bp cuff. Check bp weekly, let us know if >160/110.   Follow-up: Return for 1wk postop check from 9/16.   Future Appointments  Date Time Provider Department Center  05/17/2023  9:30 AM Myna Hidalgo, DO [facility]  05/18/2023  9:30 AM [facility] PAT 1 [facility] None    No orders of the defined types were placed in this encounter.   Myna Hidalgo, DO Attending Obstetrician & Gynecologist, [facility] for [employer], [facility]

## 2023-05-18 ENCOUNTER — Encounter (HOSPITAL_COMMUNITY)
Admission: RE | Admit: 2023-05-18 | Discharge: 2023-05-18 | Disposition: A | Discharge status: Home / Self Care | Payer: Medicaid Other | Source: Ambulatory Visit | Attending: Family Medicine | Admitting: Family Medicine

## 2023-05-18 DIAGNOSIS — Z98891 History of uterine scar from previous surgery: Secondary | ICD-10-CM | POA: Diagnosis not present

## 2023-05-18 DIAGNOSIS — Z01812 Encounter for preprocedural laboratory examination: Secondary | ICD-10-CM | POA: Insufficient documentation

## 2023-05-18 DIAGNOSIS — Z3009 Encounter for other general counseling and advice on contraception: Secondary | ICD-10-CM | POA: Diagnosis not present

## 2023-05-18 DIAGNOSIS — Z01818 Encounter for other preprocedural examination: Secondary | ICD-10-CM | POA: Diagnosis present

## 2023-05-18 HISTORY — DX: Gestational (pregnancy-induced) hypertension without significant proteinuria, unspecified trimester: O13.9

## 2023-05-18 LAB — COMPREHENSIVE METABOLIC PANEL
ALT: 28 U/L (ref 0–44)
AST: 28 U/L (ref 15–41)
Albumin: 2.7 g/dL — ABNORMAL LOW (ref 3.5–5.0)
Alkaline Phosphatase: 106 U/L (ref 38–126)
Anion gap: 12 (ref 5–15)
BUN: 9 mg/dL (ref 6–20)
CO2: 18 mmol/L — ABNORMAL LOW (ref 22–32)
Calcium: 9.2 mg/dL (ref 8.9–10.3)
Chloride: 106 mmol/L (ref 98–111)
Creatinine, Ser: 0.61 mg/dL (ref 0.44–1.00)
GFR, Estimated: >60 mL/min (ref >60)
Glucose, Bld: 118 mg/dL — ABNORMAL HIGH (ref 70–99)
Potassium: 3.7 mmol/L (ref 3.5–5.1)
Sodium: 136 mmol/L (ref 135–145)
Total Bilirubin: 0.5 mg/dL (ref 0.3–1.2)
Total Protein: 6.8 g/dL (ref 6.5–8.1)

## 2023-05-18 LAB — CBC
HCT: 41.6 % (ref 36.0–46.0)
Hemoglobin: 13.2 g/dL (ref 12.0–15.0)
MCH: 27.2 pg (ref 26.0–34.0)
MCHC: 31.7 g/dL (ref 30.0–36.0)
MCV: 85.8 fL (ref 80.0–100.0)
Platelets: 273 10*3/uL (ref 150–400)
RBC: 4.85 MIL/uL (ref 3.87–5.11)
RDW: 14.7 % (ref 11.5–15.5)
WBC: 11.2 10*3/uL — ABNORMAL HIGH (ref 4.0–10.5)
nRBC: 0 % (ref 0.0–0.2)

## 2023-05-18 LAB — TYPE AND SCREEN
ABO/RH(D): A POS
Antibody Screen: NEGATIVE

## 2023-05-19 LAB — RPR: RPR Ser Ql: NONREACTIVE

## 2023-05-20 ENCOUNTER — Encounter (HOSPITAL_COMMUNITY): Payer: Self-pay | Admitting: Family Medicine

## 2023-05-21 ENCOUNTER — Other Ambulatory Visit: Payer: Medicaid Other

## 2023-05-21 ENCOUNTER — Encounter (HOSPITAL_COMMUNITY): Payer: Self-pay | Admitting: Family Medicine

## 2023-05-21 ENCOUNTER — Other Ambulatory Visit: Payer: Self-pay

## 2023-05-21 ENCOUNTER — Inpatient Hospital Stay (HOSPITAL_COMMUNITY)
Admission: AD | Admit: 2023-05-21 | Discharge: 2023-05-23 | DRG: 784 | Disposition: A | Discharge status: Home / Self Care | Payer: Medicaid Other | Attending: Family Medicine | Admitting: Family Medicine

## 2023-05-21 ENCOUNTER — Encounter (HOSPITAL_COMMUNITY)
Admission: AD | Disposition: A | Discharge status: Home / Self Care | Payer: Self-pay | Source: Home / Self Care | Attending: Family Medicine

## 2023-05-21 ENCOUNTER — Inpatient Hospital Stay (HOSPITAL_COMMUNITY): Payer: Medicaid Other | Admitting: Anesthesiology

## 2023-05-21 ENCOUNTER — Inpatient Hospital Stay (HOSPITAL_COMMUNITY): Payer: Self-pay | Admitting: Anesthesiology

## 2023-05-21 DIAGNOSIS — O1092 Unspecified pre-existing hypertension complicating childbirth: Secondary | ICD-10-CM | POA: Diagnosis not present

## 2023-05-21 DIAGNOSIS — Z8711 Personal history of peptic ulcer disease: Secondary | ICD-10-CM | POA: Diagnosis not present

## 2023-05-21 DIAGNOSIS — O24425 Gestational diabetes mellitus in childbirth, controlled by oral hypoglycemic drugs: Secondary | ICD-10-CM | POA: Diagnosis present

## 2023-05-21 DIAGNOSIS — O3663X0 Maternal care for excessive fetal growth, third trimester, not applicable or unspecified: Secondary | ICD-10-CM | POA: Diagnosis not present

## 2023-05-21 DIAGNOSIS — O99214 Obesity complicating childbirth: Secondary | ICD-10-CM | POA: Diagnosis present

## 2023-05-21 DIAGNOSIS — Z3A37 37 weeks gestation of pregnancy: Secondary | ICD-10-CM

## 2023-05-21 DIAGNOSIS — Z7982 Long term (current) use of aspirin: Secondary | ICD-10-CM | POA: Diagnosis not present

## 2023-05-21 DIAGNOSIS — O34211 Maternal care for low transverse scar from previous cesarean delivery: Principal | ICD-10-CM | POA: Diagnosis present

## 2023-05-21 DIAGNOSIS — O0990 Supervision of high risk pregnancy, unspecified, unspecified trimester: Secondary | ICD-10-CM

## 2023-05-21 DIAGNOSIS — Z87891 Personal history of nicotine dependence: Secondary | ICD-10-CM

## 2023-05-21 DIAGNOSIS — O134 Gestational [pregnancy-induced] hypertension without significant proteinuria, complicating childbirth: Secondary | ICD-10-CM

## 2023-05-21 DIAGNOSIS — Z640 Problems related to unwanted pregnancy: Secondary | ICD-10-CM | POA: Diagnosis not present

## 2023-05-21 DIAGNOSIS — Z302 Encounter for sterilization: Secondary | ICD-10-CM | POA: Diagnosis not present

## 2023-05-21 DIAGNOSIS — Z825 Family history of asthma and other chronic lower respiratory diseases: Secondary | ICD-10-CM | POA: Diagnosis not present

## 2023-05-21 DIAGNOSIS — Z9079 Acquired absence of other genital organ(s): Secondary | ICD-10-CM

## 2023-05-21 DIAGNOSIS — O35EXX0 Maternal care for other (suspected) fetal abnormality and damage, fetal genitourinary anomalies, not applicable or unspecified: Secondary | ICD-10-CM

## 2023-05-21 DIAGNOSIS — O9962 Diseases of the digestive system complicating childbirth: Secondary | ICD-10-CM | POA: Diagnosis present

## 2023-05-21 DIAGNOSIS — O24424 Gestational diabetes mellitus in childbirth, insulin controlled: Secondary | ICD-10-CM | POA: Diagnosis not present

## 2023-05-21 DIAGNOSIS — Z98891 History of uterine scar from previous surgery: Principal | ICD-10-CM

## 2023-05-21 DIAGNOSIS — K2100 Gastro-esophageal reflux disease with esophagitis, without bleeding: Secondary | ICD-10-CM | POA: Diagnosis not present

## 2023-05-21 DIAGNOSIS — O24419 Gestational diabetes mellitus in pregnancy, unspecified control: Secondary | ICD-10-CM | POA: Diagnosis present

## 2023-05-21 DIAGNOSIS — Z8249 Family history of ischemic heart disease and other diseases of the circulatory system: Secondary | ICD-10-CM

## 2023-05-21 DIAGNOSIS — O09299 Supervision of pregnancy with other poor reproductive or obstetric history, unspecified trimester: Secondary | ICD-10-CM

## 2023-05-21 DIAGNOSIS — O1002 Pre-existing essential hypertension complicating childbirth: Secondary | ICD-10-CM | POA: Diagnosis not present

## 2023-05-21 DIAGNOSIS — Z3009 Encounter for other general counseling and advice on contraception: Secondary | ICD-10-CM | POA: Diagnosis present

## 2023-05-21 DIAGNOSIS — I10 Essential (primary) hypertension: Secondary | ICD-10-CM | POA: Diagnosis present

## 2023-05-21 LAB — ABO/RH: ABO/RH(D): A POS

## 2023-05-21 LAB — GLUCOSE, CAPILLARY
Glucose-Capillary: 92 mg/dL (ref 70–99)
Glucose-Capillary: 91 mg/dL (ref 70–99)

## 2023-05-21 SURGERY — Surgical Case
Anesthesia: Spinal

## 2023-05-21 MED ORDER — BUPIVACAINE IN DEXTROSE 0.75-8.25 % IT SOLN
INTRATHECAL | Status: DC | PRN
Start: 2023-05-21 — End: 2023-05-21
  Administered 2023-05-21: 1.55 mL via INTRATHECAL

## 2023-05-21 MED ORDER — SODIUM CHLORIDE 0.9 % IR SOLN
Status: DC | PRN
  Administered 2023-05-21: 1

## 2023-05-21 MED ORDER — OXYCODONE HCL 5 MG PO TABS: 5.0000 mg | ORAL_TABLET | Freq: Once | ORAL | Status: DC | PRN

## 2023-05-21 MED ORDER — SENNOSIDES-DOCUSATE SODIUM 8.6-50 MG PO TABS
2.0000 | ORAL_TABLET | ORAL | Status: DC
  Administered 2023-05-21 – 2023-05-22 (×2): 2 via ORAL
  Filled 2023-05-21 (×2): qty 2

## 2023-05-21 MED ORDER — DEXAMETHASONE SODIUM PHOSPHATE 10 MG/ML IJ SOLN
INTRAMUSCULAR | Status: DC | PRN
  Administered 2023-05-21: 10 mg via INTRAVENOUS

## 2023-05-21 MED ORDER — ACETAMINOPHEN 325 MG PO TABS: 325.0000 mg | ORAL_TABLET | ORAL | Status: DC | PRN

## 2023-05-21 MED ORDER — COCONUT OIL OIL: 1.0000 | TOPICAL_OIL | Status: DC | PRN

## 2023-05-21 MED ORDER — MORPHINE SULFATE (PF) 0.5 MG/ML IJ SOLN
INTRAMUSCULAR | Status: AC
  Filled 2023-05-21: qty 10

## 2023-05-21 MED ORDER — PHENYLEPHRINE HCL-NACL 20-0.9 MG/250ML-% IV SOLN
INTRAVENOUS | Status: AC
  Filled 2023-05-21: qty 250

## 2023-05-21 MED ORDER — CEFAZOLIN IN SODIUM CHLORIDE 3-0.9 GM/100ML-% IV SOLN
2.0000 g | INTRAVENOUS | Status: AC
  Administered 2023-05-21: 2 g via INTRAVENOUS

## 2023-05-21 MED ORDER — FUROSEMIDE 20 MG PO TABS
20.0000 mg | ORAL_TABLET | Freq: Every day | ORAL | Status: DC
  Administered 2023-05-21: 20 mg via ORAL
  Filled 2023-05-21: qty 1

## 2023-05-21 MED ORDER — ACETAMINOPHEN 160 MG/5ML PO SOLN: 325.0000 mg | ORAL | Status: DC | PRN

## 2023-05-21 MED ORDER — KETOROLAC TROMETHAMINE 30 MG/ML IJ SOLN
30.0000 mg | Freq: Four times a day (QID) | INTRAMUSCULAR | Status: AC
  Administered 2023-05-21 – 2023-05-22 (×4): 30 mg via INTRAVENOUS
  Filled 2023-05-21 (×4): qty 1

## 2023-05-21 MED ORDER — WITCH HAZEL-GLYCERIN EX PADS: 1.0000 | MEDICATED_PAD | CUTANEOUS | Status: DC | PRN

## 2023-05-21 MED ORDER — LACTATED RINGERS IV SOLN: INTRAVENOUS | Status: DC

## 2023-05-21 MED ORDER — ACETAMINOPHEN 10 MG/ML IV SOLN
INTRAVENOUS | Status: AC
  Filled 2023-05-21: qty 100

## 2023-05-21 MED ORDER — MEPERIDINE HCL 25 MG/ML IJ SOLN: 6.2500 mg | INTRAMUSCULAR | Status: DC | PRN

## 2023-05-21 MED ORDER — ONDANSETRON HCL 4 MG/2ML IJ SOLN
INTRAMUSCULAR | Status: AC
  Filled 2023-05-21: qty 2

## 2023-05-21 MED ORDER — STERILE WATER FOR IRRIGATION IR SOLN
Status: DC | PRN
  Administered 2023-05-21: 1000 mL

## 2023-05-21 MED ORDER — MORPHINE SULFATE (PF) 0.5 MG/ML IJ SOLN
INTRAMUSCULAR | Status: DC | PRN
Start: 2023-05-21 — End: 2023-05-21
  Administered 2023-05-21: 150 ug via INTRATHECAL

## 2023-05-21 MED ORDER — ACETAMINOPHEN 10 MG/ML IV SOLN
INTRAVENOUS | Status: DC | PRN
  Administered 2023-05-21: 1000 mg via INTRAVENOUS

## 2023-05-21 MED ORDER — DIBUCAINE (PERIANAL) 1 % EX OINT: 1.0000 | TOPICAL_OINTMENT | CUTANEOUS | Status: DC | PRN

## 2023-05-21 MED ORDER — OXYTOCIN-SODIUM CHLORIDE 30-0.9 UT/500ML-% IV SOLN
2.5000 [IU]/h | INTRAVENOUS | Status: AC
  Administered 2023-05-21: 2.5 [IU]/h via INTRAVENOUS
  Filled 2023-05-21: qty 500

## 2023-05-21 MED ORDER — FENTANYL CITRATE (PF) 100 MCG/2ML IJ SOLN
INTRAMUSCULAR | Status: AC
  Filled 2023-05-21: qty 2

## 2023-05-21 MED ORDER — FENTANYL CITRATE (PF) 100 MCG/2ML IJ SOLN: 25.0000 ug | INTRAMUSCULAR | Status: DC | PRN

## 2023-05-21 MED ORDER — CEFAZOLIN SODIUM-DEXTROSE 2-3 GM-%(50ML) IV SOLR
INTRAVENOUS | Status: DC | PRN
Start: 2023-05-21 — End: 2023-05-21
  Administered 2023-05-21: 2 g via INTRAVENOUS

## 2023-05-21 MED ORDER — PRENATAL MULTIVITAMIN CH
1.0000 | ORAL_TABLET | Freq: Every day | ORAL | Status: DC
  Administered 2023-05-22 – 2023-05-23 (×2): 1 via ORAL
  Filled 2023-05-21 (×2): qty 1

## 2023-05-21 MED ORDER — ENOXAPARIN SODIUM 60 MG/0.6ML IJ SOSY
60.0000 mg | PREFILLED_SYRINGE | INTRAMUSCULAR | Status: DC
  Filled 2023-05-21: qty 0.6

## 2023-05-21 MED ORDER — NIFEDIPINE ER OSMOTIC RELEASE 30 MG PO TB24
30.0000 mg | ORAL_TABLET | Freq: Every day | ORAL | Status: DC
  Administered 2023-05-21 – 2023-05-23 (×3): 30 mg via ORAL
  Filled 2023-05-21 (×3): qty 1

## 2023-05-21 MED ORDER — HYDROCODONE-ACETAMINOPHEN 5-325 MG PO TABS
1.0000 | ORAL_TABLET | ORAL | Status: DC | PRN
  Administered 2023-05-22: 1 via ORAL
  Filled 2023-05-21: qty 1

## 2023-05-21 MED ORDER — ONDANSETRON HCL 4 MG/2ML IJ SOLN: 4.0000 mg | Freq: Once | INTRAMUSCULAR | Status: DC | PRN

## 2023-05-21 MED ORDER — FUROSEMIDE 20 MG PO TABS: 20.0000 mg | ORAL_TABLET | Freq: Every day | ORAL | Status: DC

## 2023-05-21 MED ORDER — PHENYLEPHRINE HCL-NACL 20-0.9 MG/250ML-% IV SOLN
INTRAVENOUS | Status: DC | PRN
  Administered 2023-05-21: 60 ug/min via INTRAVENOUS

## 2023-05-21 MED ORDER — FENTANYL CITRATE (PF) 100 MCG/2ML IJ SOLN
INTRAMUSCULAR | Status: DC | PRN
Start: 2023-05-21 — End: 2023-05-21
  Administered 2023-05-21: 85 ug via INTRAVENOUS

## 2023-05-21 MED ORDER — SIMETHICONE 80 MG PO CHEW
80.0000 mg | CHEWABLE_TABLET | Freq: Three times a day (TID) | ORAL | Status: DC
  Administered 2023-05-21 – 2023-05-23 (×6): 80 mg via ORAL
  Filled 2023-05-21 (×6): qty 1

## 2023-05-21 MED ORDER — PHENYLEPHRINE HCL (PRESSORS) 10 MG/ML IV SOLN
INTRAVENOUS | Status: DC | PRN
Start: 2023-05-21 — End: 2023-05-21
  Administered 2023-05-21: 80 ug via INTRAVENOUS
  Administered 2023-05-21: 160 ug via INTRAVENOUS
  Administered 2023-05-21 (×3): 80 ug via INTRAVENOUS

## 2023-05-21 MED ORDER — CEFAZOLIN SODIUM-DEXTROSE 2-4 GM/100ML-% IV SOLN
INTRAVENOUS | Status: AC
  Filled 2023-05-21: qty 100

## 2023-05-21 MED ORDER — SOD CITRATE-CITRIC ACID 500-334 MG/5ML PO SOLN
30.0000 mL | ORAL | Status: AC
  Administered 2023-05-21: 30 mL via ORAL

## 2023-05-21 MED ORDER — PHENYLEPHRINE 80 MCG/ML (10ML) SYRINGE FOR IV PUSH (FOR BLOOD PRESSURE SUPPORT)
PREFILLED_SYRINGE | INTRAVENOUS | Status: AC
  Filled 2023-05-21: qty 10

## 2023-05-21 MED ORDER — ONDANSETRON HCL 4 MG/2ML IJ SOLN
INTRAMUSCULAR | Status: DC | PRN
Start: 2023-05-21 — End: 2023-05-21
  Administered 2023-05-21: 4 mg via INTRAVENOUS

## 2023-05-21 MED ORDER — DEXAMETHASONE SODIUM PHOSPHATE 10 MG/ML IJ SOLN
INTRAMUSCULAR | Status: AC
  Filled 2023-05-21: qty 1

## 2023-05-21 MED ORDER — FENTANYL CITRATE (PF) 100 MCG/2ML IJ SOLN
INTRAMUSCULAR | Status: DC | PRN
Start: 2023-05-21 — End: 2023-05-21
  Administered 2023-05-21: 15 ug via INTRATHECAL

## 2023-05-21 MED ORDER — POVIDONE-IODINE 10 % EX SWAB
2.0000 | Freq: Once | CUTANEOUS | Status: AC
  Administered 2023-05-21: 2 via TOPICAL

## 2023-05-21 MED ORDER — SOD CITRATE-CITRIC ACID 500-334 MG/5ML PO SOLN
ORAL | Status: AC
  Filled 2023-05-21: qty 30

## 2023-05-21 MED ORDER — SIMETHICONE 80 MG PO CHEW
80.0000 mg | CHEWABLE_TABLET | ORAL | Status: DC | PRN
  Administered 2023-05-22: 80 mg via ORAL
  Filled 2023-05-21: qty 1

## 2023-05-21 MED ORDER — MENTHOL 3 MG MT LOZG: 1.0000 | LOZENGE | OROMUCOSAL | Status: DC | PRN

## 2023-05-21 MED ORDER — DEXMEDETOMIDINE HCL IN NACL 80 MCG/20ML IV SOLN
INTRAVENOUS | Status: DC | PRN
Start: 2023-05-21 — End: 2023-05-21
  Administered 2023-05-21 (×2): 8 ug via INTRAVENOUS

## 2023-05-21 MED ORDER — IBUPROFEN 600 MG PO TABS
600.0000 mg | ORAL_TABLET | Freq: Four times a day (QID) | ORAL | Status: DC
  Administered 2023-05-22 – 2023-05-23 (×4): 600 mg via ORAL
  Filled 2023-05-21 (×4): qty 1

## 2023-05-21 MED ORDER — DIPHENHYDRAMINE HCL 25 MG PO CAPS: 25.0000 mg | ORAL_CAPSULE | Freq: Four times a day (QID) | ORAL | Status: DC | PRN

## 2023-05-21 MED ORDER — OXYCODONE HCL 5 MG/5ML PO SOLN: 5.0000 mg | Freq: Once | ORAL | Status: DC | PRN

## 2023-05-21 MED ORDER — LABETALOL HCL 100 MG PO TABS: 100.0000 mg | ORAL_TABLET | Freq: Two times a day (BID) | ORAL | Status: DC

## 2023-05-21 SURGICAL SUPPLY — 35 items
ADH SKN CLS APL DERMABOND .7 (GAUZE/BANDAGES/DRESSINGS) ×1
APL PRP STRL LF DISP 70% ISPRP (MISCELLANEOUS) ×1
CHLORAPREP W/TINT 26 (MISCELLANEOUS) IMPLANT
CLAMP UMBILICAL CORD (MISCELLANEOUS) IMPLANT
DERMABOND ADVANCED .7 DNX12 (GAUZE/BANDAGES/DRESSINGS) IMPLANT
DRSG OPSITE POSTOP 4X10 (GAUZE/BANDAGES/DRESSINGS) IMPLANT
ELECT REM PT RETURN 9FT ADLT (ELECTROSURGICAL) ×1
ELECTRODE REM PT RTRN 9FT ADLT (ELECTROSURGICAL) IMPLANT
GAUZE PAD ABD 7.5X8 STRL (GAUZE/BANDAGES/DRESSINGS) IMPLANT
GAUZE SPONGE 4X4 12PLY STRL LF (GAUZE/BANDAGES/DRESSINGS) IMPLANT
GLOVE BIOGEL PI IND STRL 7.0 (GLOVE) IMPLANT
GLOVE ECLIPSE 7.0 STRL STRAW (GLOVE) IMPLANT
GOWN STRL REUS W/TWL LRG LVL3 (GOWN DISPOSABLE) IMPLANT
HEMOSTAT ARISTA ABSORB 3G PWDR (HEMOSTASIS) IMPLANT
LIGASURE IMPACT 36 18CM CVD LR (INSTRUMENTS) IMPLANT
MAT PREVALON FULL STRYKER (MISCELLANEOUS) IMPLANT
NS IRRIG 1000ML POUR BTL (IV SOLUTION) IMPLANT
PACK C SECTION WH (CUSTOM PROCEDURE TRAY) IMPLANT
PAD OB MATERNITY 4.3X12.25 (PERSONAL CARE ITEMS) IMPLANT
RETAINER VISCERAL (MISCELLANEOUS) IMPLANT
RETRACTOR TRAXI PANNICULUS (MISCELLANEOUS) IMPLANT
RETRACTOR WND ALEXIS 25 LRG (MISCELLANEOUS) IMPLANT
RTRCTR WOUND ALEXIS 25CM LRG (MISCELLANEOUS) ×1
SUT MNCRL 0 VIOLET CTX 36 (SUTURE) IMPLANT
SUT PLAIN 0 NONE (SUTURE) IMPLANT
SUT PLAIN 2 0 (SUTURE) ×1
SUT PLAIN ABS 2-0 CT1 27XMFL (SUTURE) IMPLANT
SUT VIC AB 0 CTX 36 (SUTURE) ×1
SUT VIC AB 0 CTX36XBRD ANBCTRL (SUTURE) IMPLANT
SUT VIC AB 2-0 CT1 27 (SUTURE) ×1
SUT VIC AB 2-0 CT1 TAPERPNT 27 (SUTURE) IMPLANT
SUT VIC AB 4-0 KS 27 (SUTURE) IMPLANT
TOWEL OR 17X24 6PK STRL BLUE (TOWEL DISPOSABLE) IMPLANT
TRAY FOLEY W/BAG SLVR 14FR LF (SET/KITS/TRAYS/PACK) IMPLANT
WATER STERILE IRR 1000ML POUR (IV SOLUTION) IMPLANT

## 2023-05-21 NOTE — Op Note (Signed)
Operative Note   Patient: Allison Garcia  Date of Procedure: 05/21/2023  Procedure: Repeat Low Transverse Cesarean and Bilateral Tubal Ligation via Bilateral salpingectomy   Indications: patient declines vag del attempt, elective repeat, previous uterine incision: low transverse and chronic hypertension on medications, GDMA2 with signs of poor control (fetal macrosomia), unwanted fertility  Pre-operative Diagnosis: RCSx2 Undesired Fertility.   Post-operative Diagnosis: Same  TOLAC Candidate: No  Surgeon: Surgeons and Role:    * Venora Maples, MD - Primary    * Joanne Gavel, MD - Assisting  An experienced assistant was required given the standard of surgical care given the complexity of the case.  This assistant was needed for exposure, dissection, suctioning, retraction, instrument exchange, assisting with delivery with administration of fundal pressure, and for overall help during the procedure.   Anesthesia: spinal  Anesthesiologist: Bethena Midget, MD   Antibiotics: Cefazolin   Estimated Blood Loss: 321 ml   Total IV Fluids: 1116 ml  Urine Output:  200 cc OF clear urine  Specimens: bilateral fallopian tubes to pathology   Complications: no complications   Indications: Allison Garcia is a 32 y.o. [id number] with an IUP [redacted]w[redacted]d presenting for scheduled cesarean secondary to the indications listed above..  The risks of cesarean section were discussed with the patient including but were not limited to: bleeding which may require transfusion or reoperation; infection which may require antibiotics; injury to bowel, bladder, ureters or other surrounding organs; injury to the fetus; need for additional procedures including hysterectomy in the event of a life-threatening hemorrhage; placental abnormalities wth subsequent pregnancies, incisional problems, thromboembolic phenomenon and other postoperative/anesthesia complications.  Patient also desires permanent sterilization.   Other reversible forms of contraception were discussed with patient; she declines all other modalities. Risks of procedure discussed with patient including but not limited to: risk of regret, permanence of method, bleeding, infection, injury to surrounding organs and need for additional procedures.  Failure risk of about 1% with increased risk of ectopic gestation if pregnancy occurs was also discussed with patient.  Also discussed possibility of post-tubal pain syndrome. The patient concurred with the proposed plan, giving informed written consent for the procedures.  Patient has been NPO since last night she will remain NPO for procedure. Anesthesia and OR aware.  Preoperative prophylactic antibiotics and SCDs ordered on call to the OR.   Findings: Viable infant in cephalic presentation, no nuchal cord present. Apgars 8, 9, . Weight 3590 g. Clear amniotic fluid. Normal placenta, three vessel cord. Normal uterus, Normal bilateral fallopian tubes, Normal bilateral ovaries. Moderate adhesive disease, primarily on the anterior wall of the uterus with bladder adhered quite high, one large central adhesion that had to be ligated and cut, and diffuse filmy adhesions around the fundus .  Procedure Details: A Time Out was held and the above information confirmed. The patient received intravenous antibiotics and had sequential compression devices applied to her lower extremities preoperatively. The patient was taken back to the operative suite where spinal anesthesia was administered. After induction of anesthesia, the patient was draped and prepped in the usual sterile manner and placed in a dorsal supine position with a leftward tilt. A low transverse skin incision was made with scalpel and carried down through the subcutaneous tissue to the fascia. Fascial incision was made and extended transversely. The fascia was separated from the underlying rectus tissue superiorly and inferiorly. The rectus muscles were  separated in the midline bluntly and the peritoneum was entered bluntly. At this  point adhesive disease was encountered with the bladder adherent to the lower uterine segment of the uterus. The utero-vesical peritoneal reflection was incised transversely and the bladder flap was bluntly freed from the lower uterine segment. An Alexis retractor was placed to aid in visualization of the uterus.  A low transverse uterine incision was made. The infant was successfully delivered from cephalic presentation with assistance of Kiwi vacuum, there were no pop offs, the umbilical cord was clamped after 1 minute. Cord ph was not sent, and cord blood was obtained for evaluation. The placenta was removed Intact and appeared normal. The uterine incision was closed with a single layer running unlocked suture of 0-Monocryl. Due to ongoing bleeding from the hysterotomy figure of eight sutures of 0 Monocryl were placed after which there was excellent hemostasis. In addition, a layer of Arista was placed.  Attention was then turned to the fallopian tubes. At this point a large adhesion of omentum was noted to the anterior surface of the uterus approaching the fundus which was inhibiting our view as well as our ability to rotate the uterus. This was double ligated with 0 plain gut free ties and transected with Bovie cautery, at which point we were able to mobilize the uterus much better. The left Fallopian tube was identified and then traced to it's fimbriae. Using the Ligasure device and taking care to avoid large vascular structures, the left fallopian tube was removed sequentially from the fimbriae and extending almost to the cornua (unable to incorporate about 1 cm of tube due to nature of surgical window), with excellent hemostasis noted. Attention was then turned to the right fallopian tube, and after confirmation of identification by tracing the tube out to the fimbriae, the same procedure was then performed along the entire  length of the tube with excellent hemostasis noted. .  The abdomen and pelvis were cleared of all clot and debris and the Jon Gills was removed. Hemostasis was confirmed on all surfaces.  The peritoneum was adherent to the muscle wall, and so a loose reapproximation of the abdominal wall was made with remaining 0 monocryl suture. The fascia was then closed using 0 Vicryl in a running fashion. The subcutaneous layer was reapproximated with 2-0 plain gut suture. The skin was closed with a 4-0 vicryl subcuticular stitch. The patient tolerated the procedure well. Sponge, lap, instrument and needle counts were correct x 2. She was taken to the recovery room in stable condition.  Disposition: PACU - hemodynamically stable.    Signed: Venora Maples, MD/MPH Attending Family Medicine Physician, [facility] for [facility], [facility]

## 2023-05-21 NOTE — Transfer of Care (Signed)
Immediate Anesthesia Transfer of Care Note  Patient: Allison Garcia  Procedure(s) Performed: CESAREAN SECTION WITH BILATERAL TUBAL LIGATION  Patient Location: PACU  Anesthesia Type:Spinal  Level of Consciousness: awake, alert , oriented, and patient cooperative  Airway & Oxygen Therapy: Patient Spontanous Breathing  Post-op Assessment: Report given to RN and Post -op Vital signs reviewed and stable  Post vital signs: Reviewed and stable  Last Vitals:  Vitals Value Taken Time  BP 122/72 05/21/23 1408  Temp    Pulse 71 05/21/23 1410  Resp 15 05/21/23 1410  SpO2 96 % 05/21/23 1410  Vitals shown include unfiled device data.  Last Pain:  Vitals:   05/21/23 1023  TempSrc: Oral         Complications: No notable events documented.

## 2023-05-21 NOTE — Anesthesia Postprocedure Evaluation (Signed)
Anesthesia Post Note  Patient: Allison Garcia  Procedure(s) Performed: CESAREAN SECTION WITH BILATERAL TUBAL LIGATION     Patient location during evaluation: PACU Anesthesia Type: Spinal Level of consciousness: oriented and awake and alert Pain management: pain level controlled Vital Signs Assessment: post-procedure vital signs reviewed and stable Respiratory status: spontaneous breathing, respiratory function stable and patient connected to nasal cannula oxygen Cardiovascular status: blood pressure returned to baseline and stable Postop Assessment: no headache, no backache and no apparent nausea or vomiting Anesthetic complications: no   No notable events documented.  Last Vitals:  Vitals:   05/21/23 1445 05/21/23 1500  BP: 109/87 124/71  Pulse: 77 76  Resp: (!) 21 20  Temp: 36.5 C   SpO2: 95% 94%    Last Pain:  Vitals:   05/21/23 1445  TempSrc: Oral   Pain Goal:    LLE Motor Response: Purposeful movement (05/21/23 1500) LLE Sensation: Tingling (05/21/23 1500) RLE Motor Response: Purposeful movement (05/21/23 1500) RLE Sensation: Tingling (05/21/23 1500)     Epidural/Spinal Function Cutaneous sensation: Able to Discern Pressure (05/21/23 1500), Patient able to flex knees: Yes (05/21/23 1500), Patient able to lift hips off bed: No (05/21/23 1500), Back pain beyond tenderness at insertion site: No (05/21/23 1500), Progressively worsening motor and/or sensory loss: No (05/21/23 1500), Bowel and/or bladder incontinence post epidural: No (05/21/23 1500)  [name]

## 2023-05-21 NOTE — Discharge Summary (Signed)
Postpartum Discharge Summary  Date of Service updated***     Patient Name: Allison Garcia DOB: October 13, 1990 MRN: [id number]  Date of admission: 05/21/2023 Delivery date:05/21/2023 Delivering provider: Joanne Gavel Date of discharge: 05/21/2023  Admitting diagnosis: History of cesarean delivery [Z98.891] Intrauterine pregnancy: [redacted]w[redacted]d     Secondary diagnosis:  Principal Problem:   History of cesarean delivery Active Problems:   Chronic hypertension   Supervision of high risk pregnancy, antepartum   Hx of severe preeclampsia   Unwanted fertility   Gestational diabetes   Fetal renal pelvic dilation   History of bilateral salpingectomy  Additional problems: ***    Discharge diagnosis: Term Pregnancy Delivered, CHTN, and GDM A2                                              Post partum procedures:postpartum tubal ligation Augmentation: N/A Complications: {OB Labor/Delivery Complications:20784}  Hospital course: Sceduled C/S   32 y.o. yo G2P2002 at [redacted]w[redacted]d was admitted to the hospital 05/21/2023 for scheduled cesarean section with the following indication:Elective Repeat.Delivery details are as follows:  Membrane Rupture Time/Date: 12:54 PM,05/21/2023  Delivery Method:C-Section, Vacuum Assisted Operative Delivery:Device used:Kiwi Indication: inability to deliver fetal head with normal manuevers Details of operation can be found in separate operative note.  Patient had a postpartum course complicated by***.  She is ambulating, tolerating a regular diet, passing flatus, and urinating well. Patient is discharged home in stable condition on  05/21/23        Newborn Data: Birth date:05/21/2023 Birth time:12:58 PM Gender:Female Living status:Living Apgars:8 ,9  Weight:3590 g    Magnesium Sulfate received: No BMZ received: No Rhophylac:N/A MMR:No T-DaP: declined prenatally Flu: {YQM:57846} Transfusion:{Transfusion received:30440034}  Physical exam  Vitals:   05/21/23 1023  05/21/23 1028  BP: 114/88   Pulse:  93  Resp:  17  Temp: 98.1 F (36.7 C)   TempSrc: Oral   SpO2:  97%  Weight: 115.6 kg   Height: 5\' 1"  (1.549 m)    General: {Exam; general:21111117} Lochia: {Desc; appropriate/inappropriate:30686::"appropriate"} Uterine Fundus: {Desc; firm/soft:30687} Incision: {Exam; incision:21111123} DVT Evaluation: {Exam; NGE:9528413} Labs: Lab Results  Component Value Date   WBC 11.2 (H) 05/18/2023   HGB 13.2 05/18/2023   HCT 41.6 05/18/2023   MCV 85.8 05/18/2023   PLT 273 05/18/2023      Latest Ref Rng & Units 05/18/2023    9:10 AM  CMP  Glucose 70 - 99 mg/dL 244   BUN 6 - 20 mg/dL 9   Creatinine 0.10 - 2.72 mg/dL 5.36   Sodium 644 - 034 mmol/L 136   Potassium 3.5 - 5.1 mmol/L 3.7   Chloride 98 - 111 mmol/L 106   CO2 22 - 32 mmol/L 18   Calcium 8.9 - 10.3 mg/dL 9.2   Total Protein 6.5 - 8.1 g/dL 6.8   Total Bilirubin 0.3 - 1.2 mg/dL 0.5   Alkaline Phos 38 - 126 U/L 106   AST 15 - 41 U/L 28   ALT 0 - 44 U/L 28    Edinburgh Score:     No data to display           After visit meds:  Allergies as of 05/21/2023       Reactions   Other Anaphylaxis, Shortness Of Breath   Bleach     Med Rec must be completed prior to using this  SMARTLINK***        Discharge home in stable condition Infant Feeding: Bottle Infant Disposition:{CHL IP OB HOME WITH ZOXWRU:04540} Discharge instruction: per After Visit Summary and Postpartum booklet. Activity: Advance as tolerated. Pelvic rest for 6 weeks.  Diet: routine diet Future Appointments:No future appointments. Follow up Visit:   Please schedule this patient for a In person postpartum visit in 4 weeks with the following provider: Any provider. Additional Postpartum F/U:2 hour GTT, Incision check 1 week, and BP check 1 week  High risk pregnancy complicated by: GDM and HTN Delivery mode:  C-Section, Vacuum Assisted Anticipated Birth Control:  BTL done [facility]   05/21/2023 Venora Maples,  MD

## 2023-05-21 NOTE — Anesthesia Preprocedure Evaluation (Signed)
Anesthesia Evaluation  Patient identified by MRN, date of birth, ID band Patient awake    Reviewed: Allergy & Precautions, NPO status , Patient's Chart, lab work & pertinent test results  History of Anesthesia Complications Negative for: history of anesthetic complications  Airway Mallampati: III  TM Distance: >3 FB Neck ROM: Full    Dental  (+) Chipped, Dental Advisory Given, Teeth Intact Broken teeth X4:   Pulmonary Patient did not abstain from smoking., former smoker   Pulmonary exam normal breath sounds clear to auscultation       Cardiovascular hypertension, Pt. on medications Normal cardiovascular exam Rhythm:Regular Rate:Normal     Neuro/Psych negative neurological ROS  negative psych ROS   GI/Hepatic ,GERD  Medicated,,  Endo/Other  diabetes, Gestational  Morbid obesity  Renal/GU Renal disease     Musculoskeletal Back pain   Abdominal   Peds  Hematology   Anesthesia Other Findings   Reproductive/Obstetrics negative OB ROS                              Anesthesia Physical Anesthesia Plan  ASA: 3  Anesthesia Plan: Spinal   Post-op Pain Management: Minimal or no pain anticipated   Induction: Intravenous  PONV Risk Score and Plan: 2 and Ondansetron  Airway Management Planned: Nasal Cannula, Natural Airway and Simple Face Mask  Additional Equipment: None  Intra-op Plan:   Post-operative Plan:   Informed Consent: I have reviewed the patients History and Physical, chart, labs and discussed the procedure including the risks, benefits and alternatives for the proposed anesthesia with the patient or authorized representative who has indicated his/her understanding and acceptance.     Dental advisory given  Plan Discussed with: CRNA and Anesthesiologist  Anesthesia Plan Comments: (  )         Anesthesia Quick Evaluation

## 2023-05-21 NOTE — Anesthesia Procedure Notes (Signed)
Spinal  Patient location during procedure: OR Start time: 05/21/2023 12:20 PM End time: 05/21/2023 12:25 PM Reason for block: surgical anesthesia Staffing Anesthesiologist: Bethena Midget, MD Performed by: Bethena Midget, MD Authorized by: Bethena Midget, MD   Preanesthetic Checklist Completed: patient identified, IV checked, site marked, risks and benefits discussed, surgical consent, monitors and equipment checked, pre-op evaluation and timeout performed Spinal Block Patient position: sitting Prep: DuraPrep Patient monitoring: heart rate, cardiac monitor, continuous pulse ox and blood pressure Approach: midline Location: L3-4 Injection technique: single-shot Needle Needle type: Sprotte  Needle gauge: 24 G Needle length: 9 cm Assessment Sensory level: T4 Events: CSF return

## 2023-05-21 NOTE — H&P (Signed)
LABOR AND DELIVERY ADMISSION HISTORY AND PHYSICAL NOTE  Allison Garcia is a 32 y.o. female G2P1001 with IUP at [redacted]w[redacted]d presenting for scheduled cesarean section.   Patient reports the fetal movement as active. Patient reports uterine contraction  activity as none. Patient reports  vaginal bleeding as none. Patient describes fluid per vagina as None.   She plans on bottle feeding feeding. Her contraception plan is: bilateral tubal ligation.  Prenatal History/Complications: PNC at [facility]  Sono:  @[redacted]w[redacted]d , CWD, normal anatomy, cephalic presentation, posterior placenta, 93%ile, EFW 3371g  Pregnancy complications:  Patient Active Problem List   Diagnosis Date Noted   Fetal renal pelvic dilation 04/12/2023   Gestational diabetes 03/02/2023   Unwanted fertility 02/28/2023   Supervision of high risk pregnancy, antepartum 12/04/2022   Hx of severe preeclampsia 12/04/2022   History of cesarean delivery 11/30/2022   Reflux esophagitis 03/22/2020   GERD (gastroesophageal reflux disease) 03/22/2020   Dyspepsia 11/27/2019   History of gestational diabetes 04/08/2018   Chronic hypertension 01/25/2018    Past Medical History: Past Medical History:  Diagnosis Date   Abnormal Pap smear of cervix    Back pain    GERD (gastroesophageal reflux disease) 03/22/2020   Gestational diabetes    History of stomach ulcers    told by ED. no EGD   Preeclampsia    Pregnancy induced hypertension     Past Surgical History: Past Surgical History:  Procedure Laterality Date   CESAREAN SECTION     ESOPHAGOGASTRODUODENOSCOPY (EGD) WITH PROPOFOL N/A 02/05/2020   Procedure: ESOPHAGOGASTRODUODENOSCOPY (EGD) WITH PROPOFOL;  Surgeon: Corbin Ade, MD;  Erosive reflux esophagitis, normal examined stomach, normal examined duodenum.   TEAR DUCT PROBING     as baby    Obstetrical History: OB History     Gravida  2   Para  1   Term  1   Preterm      AB      Living  1      SAB      IAB       Ectopic      Multiple      Live Births  1           Social History: Social History   Socioeconomic History   Marital status: Single    Spouse name: Not on file   Number of children: Not on file   Years of education: Not on file   Highest education level: Not on file  Occupational History   Occupation: Nurse Tech    Comment: [employer]  Tobacco Use   Smoking status: Former    Current packs/day: 1.00    Average packs/day: 1 pack/day for 9.0 years (9.0 ttl pk-yrs)    Types: Cigarettes   Smokeless tobacco: Never  Vaping Use   Vaping status: Former  Substance and Sexual Activity   Alcohol use: Not Currently    Comment: occas   Drug use: No   Sexual activity: Yes    Birth control/protection: None  Other Topics Concern   Not on file  Social History Narrative   Not on file   Social Determinants of Health   Financial Resource Strain: Low Risk  (12/04/2022)   Overall Financial Resource Strain (CARDIA)    Difficulty of Paying Living Expenses: Not very hard  Food Insecurity: No Food Insecurity (03/08/2023)   Hunger Vital Sign    Worried About Running Out of Food in the Last Year: Never true  Ran Out of Food in the Last Year: Never true  Transportation Needs: No Transportation Needs (12/04/2022)   PRAPARE - Administrator, Civil Service (Medical): No    Lack of Transportation (Non-Medical): No  Physical Activity: Inactive (12/04/2022)   Exercise Vital Sign    Days of Exercise per Week: 5 days    Minutes of Exercise per Session: 0 min  Stress: Stress Concern Present (12/04/2022)   [employer] of Occupational Health - Occupational Stress Questionnaire    Feeling of Stress : Very much  Social Connections: Moderately Isolated (12/04/2022)   Social Connection and Isolation Panel [NHANES]    Frequency of Communication with Friends and Family: Twice a week    Frequency of Social Gatherings with Friends and Family: Once a week    Attends Religious Services:  1 to 4 times per year    Active Member of [employer] or Organizations: No    Attends Engineer, structural: Never    Marital Status: Divorced    Family History: Family History  Problem Relation Age of Onset   Gallbladder disease Mother    Torticollis Daughter    Hypertension Maternal Grandmother    COPD Other    Colon cancer Neg Hx    Colon polyps Neg Hx     Allergies: Allergies  Allergen Reactions   Other Anaphylaxis and Shortness Of Breath    Bleach      Medications Prior to Admission  Medication Sig Dispense Refill Last Dose   acetaminophen (TYLENOL) 500 MG tablet Take 1,000 mg by mouth every 6 (six) hours as needed for moderate pain.      albuterol (VENTOLIN HFA) 108 (90 Base) MCG/ACT inhaler Inhale 1-2 puffs into the lungs every 6 (six) hours as needed for wheezing or shortness of breath.      aspirin EC 81 MG tablet Take 2 tablets (162 mg total) by mouth daily. Swallow whole. 180 tablet 2 05/17/2023 at 2000   calcium carbonate (TUMS EX) 750 MG chewable tablet Chew 2 tablets by mouth daily as needed for heartburn.   05/18/2023 at 0300   docusate sodium (COLACE) 100 MG capsule Take 100 mg by mouth daily as needed for mild constipation or moderate constipation.      Doxylamine-Pyridoxine (DICLEGIS) 10-10 MG TBEC 2 tabs q hs, if sx persist add 1 tab q am on day 3, if sx persist add 1 tab q afternoon on day 4 (Patient not taking: Reported on 05/14/2023) 100 tablet 6    famotidine (PEPCID) 20 MG tablet Take 1 tablet (20 mg total) by mouth 2 (two) times daily. 60 tablet 11    labetalol (NORMODYNE) 100 MG tablet Take 1 tablet (100 mg total) by mouth 2 (two) times daily. 60 tablet 5 05/21/2023 at 0900   metFORMIN (GLUCOPHAGE) 1000 MG tablet Take 1 tablet (1,000 mg total) by mouth at bedtime. (Patient taking differently: Take 500-1,000 mg by mouth See admin instructions. Take 500 mg in the morning and 1000 mg at bedtime) 30 tablet 2 05/20/2023 at 2300   nystatin cream (MYCOSTATIN) Apply  1 Application topically 2 (two) times daily.      Prenatal Vit-Fe Fumarate-FA (PRENATAL VITAMIN PO) Take 1 tablet by mouth daily.      Accu-Chek Softclix Lancets lancets Use as instructed to check blood sugar 4 times daily 100 each 12    Blood Glucose Monitoring Suppl (ACCU-CHEK GUIDE ME) w/Device KIT 1 each by Does not apply route 4 (four) times daily. 1  kit 0    Blood Pressure Monitor MISC For regular home bp monitoring during pregnancy 1 each 0    glucose blood (ACCU-CHEK GUIDE) test strip Use as instructed to check blood sugar 4 times daily 50 each 12      Review of Systems  All systems reviewed and negative except as stated in HPI  Physical Exam BP 114/88   Pulse 93   Temp 98.1 F (36.7 C) (Oral)   Resp 17   Ht 5\' 1"  (1.549 m)   Wt 115.6 kg   LMP 08/31/2022   SpO2 97%   BMI 48.16 kg/m   Physical Exam Constitutional:      General: She is not in acute distress.    Appearance: Normal appearance. She is not ill-appearing.  HENT:     Head: Atraumatic.  Eyes:     General: No scleral icterus.    Conjunctiva/sclera: Conjunctivae normal.  Pulmonary:     Effort: Pulmonary effort is normal.  Skin:    General: Skin is warm and dry.     Coloration: Skin is not jaundiced or pale.  Neurological:     Mental Status: She is alert.     Coordination: Coordination normal.  Psychiatric:        Mood and Affect: Mood normal.        Behavior: Behavior normal.     FHR: 157 bpm  Prenatal labs: ABO, Rh: --/--/A POS Performed at [facility], [address], [place], [place] [address]  [phone]) Antibody: NEG (09/13 0920) Rubella: 1.25 (04/01 1335) RPR: NON REACTIVE (09/13 0910)  HBsAg: Negative (04/01 1335)  HIV: Non Reactive (06/26 0918)  GC/Chlamydia:  Neisseria Gonorrhea  Date Value Ref Range Status  05/10/2023 Negative  Final   Chlamydia  Date Value Ref Range Status  05/10/2023 Negative  Final   GBS: Negative/-- (09/05 1115)  Prenatal Transfer Tool   Maternal Diabetes: Yes:  Diabetes Type:  Insulin/Medication controlled Genetic Screening: Normal Maternal Ultrasounds/Referrals: Normal Fetal Ultrasounds or other Referrals:  None Maternal Substance Abuse:  No Significant Maternal Medications:  Meds include: Other: Metformin, labetalol, aspirin, famotidine Significant Maternal Lab Results: Group B Strep negative  Results for orders placed or performed during the hospital encounter of 05/21/23 (from the past 24 hour(s))  ABO/Rh   Collection Time: 05/21/23 10:31 AM  Result Value Ref Range   ABO/RH(D)      A POS Performed at [facility], [address] [place], [place] [address]   Glucose, capillary   Collection Time: 05/21/23 10:45 AM  Result Value Ref Range   Glucose-Capillary 92 70 - 99 mg/dL    Assessment: BRITTNEYANN DELAPUENTE is a 32 y.o. G2P1001 at [redacted]w[redacted]d here for scheduled cesarean section.  #Repeat Low Transverse Cesarean and Bilateral Tubal Ligation via Bilateral salpingectomy  The risks of cesarean section were discussed with the patient including but were not limited to: bleeding which may require transfusion or reoperation; infection which may require antibiotics; injury to bowel, bladder, ureters or other surrounding organs; injury to the fetus; need for additional procedures including hysterectomy in the event of a life-threatening hemorrhage; placental abnormalities wth subsequent pregnancies, incisional problems, thromboembolic phenomenon and other postoperative/anesthesia complications.  Patient also desires permanent sterilization.  Other reversible forms of contraception were discussed with patient; she declines all other modalities. Risks of procedure discussed with patient including but not limited to: risk of regret, permanence of method, bleeding, infection, injury to surrounding organs and need for additional  procedures.  Failure risk of about 1% with increased risk of ectopic gestation if pregnancy occurs was also  discussed with patient.  Also discussed possibility of post-tubal pain syndrome. The patient concurred with the proposed plan, giving informed written consent for the procedures.  Patient has been NPO since last night she will remain NPO for procedure. Anesthesia and OR aware.  Preoperative prophylactic antibiotics and SCDs ordered on call to the OR.   #Anesthesia: spinal #FWB: FHR 157 bpm by doppler #GBS/ID: Negative #MOF: bottle feeding #MOC: bilateral tubal ligation #Circ: n/a  #cHTN/hx of pre-e: continue labetalol postpartum, plan for lasix  #GDM: PP fasting glucose  Venora Maples, MD/MPH Attending Family Medicine Physician, [facility] for [facility], [facility]  05/21/2023, 11:45 AM

## 2023-05-21 NOTE — Plan of Care (Signed)

## 2023-05-22 ENCOUNTER — Encounter (HOSPITAL_COMMUNITY): Payer: Self-pay | Admitting: Family Medicine

## 2023-05-22 LAB — CBC
HCT: 33.4 % — ABNORMAL LOW (ref 36.0–46.0)
Hemoglobin: 10.8 g/dL — ABNORMAL LOW (ref 12.0–15.0)
MCH: 27.6 pg (ref 26.0–34.0)
MCHC: 32.3 g/dL (ref 30.0–36.0)
MCV: 85.2 fL (ref 80.0–100.0)
Platelets: 252 10*3/uL (ref 150–400)
RBC: 3.92 MIL/uL (ref 3.87–5.11)
RDW: 14.6 % (ref 11.5–15.5)
WBC: 14.6 10*3/uL — ABNORMAL HIGH (ref 4.0–10.5)
nRBC: 0 % (ref 0.0–0.2)

## 2023-05-22 LAB — GLUCOSE, CAPILLARY: Glucose-Capillary: 91 mg/dL (ref 70–99)

## 2023-05-22 LAB — BIRTH TISSUE RECOVERY COLLECTION (PLACENTA DONATION)

## 2023-05-22 MED ORDER — FUROSEMIDE 20 MG PO TABS
20.0000 mg | ORAL_TABLET | Freq: Every day | ORAL | Status: DC
  Administered 2023-05-22 – 2023-05-23 (×2): 20 mg via ORAL
  Filled 2023-05-22 (×2): qty 1

## 2023-05-22 NOTE — Progress Notes (Signed)
Post-Op Day 1elective repeat,  CS/BTL  Subjective: No complaints, up ad lib, voiding and tolerating PO, passing flatus,small lochia,.plans to bottle feed, bilateral tubal ligation  Objective: Blood pressure 139/79, pulse 91, temperature 98.4 F (36.9 C), temperature source Oral, resp. rate 17, height 5\' 1"  (1.549 m), weight 115.6 kg, last menstrual period 08/31/2022, SpO2 96%, unknown if currently breastfeeding.  Physical Exam:  General: alert, cooperative and no distress Lochia:normal flow Chest: CTAB Heart: RRR no m/r/g Abdomen: +BS, soft, nontender, dsg c/d/intact, no erythema Uterine Fundus: firm DVT Evaluation: No evidence of DVT seen on physical exam. Extremities: trace edema  Recent Labs    05/22/23 0514  HGB 10.8*  HCT 33.4*    Assessment/Plan: Plan for discharge tomorrow   LOS: 1 day   Jacklyn Shell 05/22/2023, 7:49 AM

## 2023-05-23 ENCOUNTER — Other Ambulatory Visit (HOSPITAL_COMMUNITY): Payer: Self-pay

## 2023-05-23 MED ORDER — NIFEDIPINE ER 30 MG PO TB24
30.0000 mg | ORAL_TABLET | Freq: Every day | ORAL | 1 refills | Status: DC
  Filled 2023-05-23: qty 60, 60d supply, fill #0

## 2023-05-23 MED ORDER — IBUPROFEN 600 MG PO TABS
600.0000 mg | ORAL_TABLET | Freq: Four times a day (QID) | ORAL | 0 refills | Status: DC
  Filled 2023-05-23: qty 30, 8d supply, fill #0

## 2023-05-23 MED ORDER — HYDROCODONE-ACETAMINOPHEN 5-325 MG PO TABS
1.0000 | ORAL_TABLET | ORAL | 0 refills | Status: DC | PRN
  Filled 2023-05-23: qty 5, 1d supply, fill #0

## 2023-05-23 MED ORDER — OMRON 3 SERIES BP MONITOR DEVI
1.0000 | Freq: Once | 0 refills | Status: AC
Start: 2023-05-23 — End: 2023-05-24
  Filled 2023-05-23: qty 1, 30d supply, fill #0

## 2023-05-23 MED ORDER — FUROSEMIDE 20 MG PO TABS
20.0000 mg | ORAL_TABLET | Freq: Every day | ORAL | 0 refills | Status: DC
  Filled 2023-05-23: qty 3, 3d supply, fill #0

## 2023-05-23 MED ORDER — SIMETHICONE 80 MG PO CHEW
80.0000 mg | CHEWABLE_TABLET | Freq: Three times a day (TID) | ORAL | 0 refills | Status: DC
  Filled 2023-05-23: qty 100, 34d supply, fill #0

## 2023-05-23 NOTE — Progress Notes (Signed)
Post Partum Day 2 Subjective: no complaints, up ad lib, voiding, tolerating PO, and + flatus. She does reports some upper abdominal discomfort from gas.   Objective: Blood pressure (!) 143/76, pulse 92, temperature 98.5 F (36.9 C), resp. rate 18, height 5\' 1"  (1.549 m), weight 115.6 kg, last menstrual period 08/31/2022, SpO2 97%, unknown if currently breastfeeding.  Physical Exam:  General: alert and cooperative Lochia: appropriate Uterine Fundus: firm Incision: no significant drainage, dressing clean dry and intact  DVT Evaluation: No evidence of DVT seen on physical exam.  Recent Labs    05/22/23 0514  HGB 10.8*  HCT 33.4*    Assessment/Plan: Discharge home - Encouraged ambulation and a gas table to help with gut motility.  - Patient verbalized understanding  - Patient is a chronic hypertensive. Meds to beds for Procardia and Lasix prior to discharge.    LOS: 2 days   Claudette Head, CNM 05/23/2023, 1:07 PM

## 2023-05-24 ENCOUNTER — Encounter: Payer: Medicaid Other | Admitting: Obstetrics & Gynecology

## 2023-05-24 ENCOUNTER — Other Ambulatory Visit: Payer: Medicaid Other

## 2023-05-28 ENCOUNTER — Encounter: Payer: Self-pay | Admitting: Women's Health

## 2023-05-28 ENCOUNTER — Ambulatory Visit: Payer: Medicaid Other | Admitting: Women's Health

## 2023-05-28 ENCOUNTER — Other Ambulatory Visit: Payer: Medicaid Other

## 2023-05-28 VITALS — BP 134/85 | HR 105 | Ht 61.0 in | Wt 241.0 lb

## 2023-05-28 DIAGNOSIS — I10 Essential (primary) hypertension: Secondary | ICD-10-CM

## 2023-05-28 DIAGNOSIS — Z0130 Encounter for examination of blood pressure without abnormal findings: Secondary | ICD-10-CM | POA: Diagnosis not present

## 2023-05-28 DIAGNOSIS — O24415 Gestational diabetes mellitus in pregnancy, controlled by oral hypoglycemic drugs: Secondary | ICD-10-CM

## 2023-05-28 DIAGNOSIS — Z4889 Encounter for other specified surgical aftercare: Secondary | ICD-10-CM

## 2023-05-28 LAB — POCT URINALYSIS DIPSTICK OB
Glucose, UA: NEGATIVE
Ketones, UA: NEGATIVE
Nitrite, UA: NEGATIVE

## 2023-05-28 NOTE — Progress Notes (Signed)
GYN VISIT Patient name: Allison Garcia MRN [id number]  Date of birth: [birth year] Chief Complaint:   Postpartum Care (1 week follow up after C/S- BP check, incision check)  History of Present Illness:   Allison Garcia is a 32 y.o. G30P2002 Caucasian female 7d s/p RCS and BTS being seen today for bp & incision check. CHTN on nifedipine 30mg  daily. Per d/c summary was also supposed to continue labetalol 100mg  BID, she thought she was supposed to stop. Wasn't on any meds prior to pregnancy. Bottlefeeding, denies PPD/anxiety.  Patient's last menstrual period was 08/31/2022.     03/08/2023    8:00 PM 12/04/2022    9:59 AM  Depression screen PHQ 2/9  Decreased Interest 0 1  Down, Depressed, Hopeless 0 0  PHQ - 2 Score 0 1  Tired, decreased energy  2  Change in appetite  1  Feeling bad or failure about yourself   0  Trouble concentrating  0  Moving slowly or fidgety/restless  0  Suicidal thoughts  0        12/04/2022    9:59 AM  GAD 7 : Generalized Anxiety Score  Nervous, Anxious, on Edge 0  Control/stop worrying 0  Worry too much - different things 0  Trouble relaxing 0  Restless 1  Easily annoyed or irritable 1  Afraid - awful might happen 0  Total GAD 7 Score 2     Review of Systems:   Pertinent items are noted in HPI Denies fever/chills, dizziness, headaches, visual disturbances, fatigue, shortness of breath, chest pain, abdominal pain, vomiting, abnormal vaginal discharge/itching/odor/irritation, problems with periods, bowel movements, urination, or intercourse unless otherwise stated above.  Pertinent History Reviewed:  Reviewed past medical,surgical, social, obstetrical and family history.  Reviewed problem list, medications and allergies. Physical Assessment:   Vitals:   05/28/23 1212 05/28/23 1218  BP: (!) 141/88 134/85  Pulse: (!) 105 (!) 105  Weight: 241 lb (109.3 kg)   Height:  5\' 1"  (1.549 m)  Body mass index is 45.54 kg/m.       Physical Examination:    General appearance: alert, well appearing, and in no distress  Mental status: alert, oriented to person, place, and time  Skin: warm & dry   Cardiovascular: normal heart rate noted  Respiratory: normal respiratory effort, no distress  Abdomen: soft, non-tender, honeycomb removed, c/s incision healing well, no s/s infection  Pelvic: examination not indicated  Extremities: no edema   Chaperone: N/A    Results for orders placed or performed in visit on 05/28/23 (from the past 24 hour(s))  POC Urinalysis Dipstick OB   Collection Time: 05/28/23 12:23 PM  Result Value Ref Range   Color, UA     Clarity, UA     Glucose, UA Negative Negative   Bilirubin, UA     Ketones, UA Negative    Spec Grav, UA     Blood, UA 3+    pH, UA     POC,PROTEIN,UA Trace Negative, Trace, Small (1+), Moderate (2+), Large (3+), 4+   Urobilinogen, UA     Nitrite, UA Negative    Leukocytes, UA Trace (A) Negative   Appearance     Odor      Assessment & Plan:  1) 7d s/p RCS> bottlefeeding, incision healing well  2) CHTN> no meds prior to pregnancy, currently on nifedipine 30mg  daily, was also supposed to be taking labetalol 100mg  BID which she didn't know. To resume this, f/u at end of  week for bp check, can be online, take meds at least 1hr before  Meds: No orders of the defined types were placed in this encounter.   Orders Placed This Encounter  Procedures   POC Urinalysis Dipstick OB    Return for Thur or Fri bp check w/ nurse online; then as scheduled for pp visit.  Cheral Marker CNM, [facility] 05/28/2023 12:37 PM

## 2023-05-31 ENCOUNTER — Other Ambulatory Visit: Payer: Medicaid Other

## 2023-05-31 ENCOUNTER — Encounter: Payer: Medicaid Other | Admitting: Obstetrics & Gynecology

## 2023-06-01 ENCOUNTER — Telehealth (INDEPENDENT_AMBULATORY_CARE_PROVIDER_SITE_OTHER): Payer: Medicaid Other | Admitting: *Deleted

## 2023-06-01 VITALS — BP 118/87 | HR 86

## 2023-06-01 DIAGNOSIS — I10 Essential (primary) hypertension: Secondary | ICD-10-CM

## 2023-06-01 DIAGNOSIS — Z0130 Encounter for examination of blood pressure without abnormal findings: Secondary | ICD-10-CM

## 2023-06-01 NOTE — Progress Notes (Signed)
   NURSE VISIT- BLOOD PRESSURE CHECK  I connected with Carollee Massed on 06/01/2023 by MyChart video and verified that I am speaking with the correct person using two identifiers.   I discussed the limitations of evaluation and management by telemedicine. The patient expressed understanding and agreed to proceed.  Nurse is at the office, and patient is at home.  SUBJECTIVE:  Allison Garcia is a 32 y.o. G49P2002 female here for BP check. She is postpartum, delivery date 05/21/23     HYPERTENSION ROS:  Postpartum:  Severe headaches that don't go away with tylenol/other medicines: No  Visual changes (seeing spots/double/blurred vision) No  Severe pain under right breast breast or in center of upper chest No  Severe nausea/vomiting No  Taking medicines as instructed yes  OBJECTIVE:  BP 118/87 (BP Location: Right Arm, Patient Position: Sitting, Cuff Size: Large)   Pulse 86   LMP 08/31/2022   Breastfeeding No   Appearance alert, well appearing, and in no distress.  ASSESSMENT: Postpartum  blood pressure check  PLAN: Discussed with Dr. Despina Hidden   Recommendations:  stop Labetalol, continue Nifedipine 30mg  daily    Follow-up: as scheduled   Jobe Marker  06/01/2023 9:44 AM

## 2023-06-04 ENCOUNTER — Other Ambulatory Visit: Payer: Medicaid Other

## 2023-06-05 ENCOUNTER — Encounter: Payer: Self-pay | Admitting: Obstetrics & Gynecology

## 2023-06-07 ENCOUNTER — Inpatient Hospital Stay (HOSPITAL_COMMUNITY): Admit: 2023-06-07 | Payer: Medicaid Other

## 2023-06-07 DIAGNOSIS — Z6841 Body Mass Index (BMI) 40.0 and over, adult: Secondary | ICD-10-CM | POA: Diagnosis not present

## 2023-06-07 DIAGNOSIS — J029 Acute pharyngitis, unspecified: Secondary | ICD-10-CM | POA: Diagnosis not present

## 2023-06-07 DIAGNOSIS — R590 Localized enlarged lymph nodes: Secondary | ICD-10-CM | POA: Diagnosis not present

## 2023-06-07 DIAGNOSIS — R030 Elevated blood-pressure reading, without diagnosis of hypertension: Secondary | ICD-10-CM | POA: Diagnosis not present

## 2023-06-07 DIAGNOSIS — R509 Fever, unspecified: Secondary | ICD-10-CM | POA: Diagnosis not present

## 2023-06-19 DIAGNOSIS — U071 COVID-19: Secondary | ICD-10-CM | POA: Diagnosis not present

## 2023-06-19 DIAGNOSIS — R030 Elevated blood-pressure reading, without diagnosis of hypertension: Secondary | ICD-10-CM | POA: Diagnosis not present

## 2023-06-19 DIAGNOSIS — Z6841 Body Mass Index (BMI) 40.0 and over, adult: Secondary | ICD-10-CM | POA: Diagnosis not present

## 2023-06-19 DIAGNOSIS — J209 Acute bronchitis, unspecified: Secondary | ICD-10-CM | POA: Diagnosis not present

## 2023-06-19 DIAGNOSIS — R051 Acute cough: Secondary | ICD-10-CM | POA: Diagnosis not present

## 2023-06-19 DIAGNOSIS — J189 Pneumonia, unspecified organism: Secondary | ICD-10-CM | POA: Diagnosis not present

## 2023-06-19 DIAGNOSIS — J029 Acute pharyngitis, unspecified: Secondary | ICD-10-CM | POA: Diagnosis not present

## 2023-06-20 ENCOUNTER — Encounter: Payer: Self-pay | Admitting: Obstetrics & Gynecology

## 2023-06-21 ENCOUNTER — Other Ambulatory Visit: Payer: Self-pay | Admitting: Obstetrics & Gynecology

## 2023-06-21 DIAGNOSIS — O10919 Unspecified pre-existing hypertension complicating pregnancy, unspecified trimester: Secondary | ICD-10-CM

## 2023-06-21 MED ORDER — LABETALOL HCL 100 MG PO TABS: 100.0000 mg | ORAL_TABLET | Freq: Two times a day (BID) | ORAL | 6 refills | Status: DC

## 2023-07-02 ENCOUNTER — Encounter: Payer: Self-pay | Admitting: Advanced Practice Midwife

## 2023-07-02 ENCOUNTER — Ambulatory Visit: Payer: Medicaid Other | Admitting: Advanced Practice Midwife

## 2023-07-02 ENCOUNTER — Other Ambulatory Visit: Payer: Medicaid Other

## 2023-07-02 DIAGNOSIS — Z8632 Personal history of gestational diabetes: Secondary | ICD-10-CM

## 2023-07-02 DIAGNOSIS — O24415 Gestational diabetes mellitus in pregnancy, controlled by oral hypoglycemic drugs: Secondary | ICD-10-CM

## 2023-07-02 DIAGNOSIS — Z131 Encounter for screening for diabetes mellitus: Secondary | ICD-10-CM

## 2023-07-02 NOTE — Addendum Note (Signed)
Addended by: Jacklyn Shell on: 07/02/2023 11:43 AM   Modules accepted: Orders

## 2023-07-02 NOTE — Patient Instructions (Addendum)
[facility] offers online women's holistic mental health counseling and therapy provided by licensed mental health counselors and therapists.   You can refer yourself using the link below: (if it isn't clickable from your mychart account, copy and paste it in a new browser).  If you have ANY problems, please let me know and I will help troubleshoot.         [email or web address]

## 2023-07-02 NOTE — Progress Notes (Signed)
Post Partum Visit Note   Chief Complaint:   Postpartum Care (Not aware she need do glucose test)  History of Present Illness:   Allison Garcia is a 32 y.o. G49P2002 Caucasian female being seen today for a postpartum visit. She is 6 weeks postpartum following a repeat cesarean section, low transverse incision at 37.4, for A2 DM and CHTN . Anesthesia: spinal.  Laceration: n/a.  Complications: none. Inpatient contraception: yes BTL .   Pregnancycomplicated by A2DM w/ suspected LGA and CHTN . Tobacco use: no. Substance use disorder: no. Last pap smear:     Component Value Date/Time   DIAGPAP  12/26/2022 0912    - Negative for intraepithelial lesion or malignancy (NILM)   HPVHIGH Negative 12/26/2022 0912   ADEQPAP  12/26/2022 0912    Satisfactory for evaluation; transformation zone component PRESENT.     Next pap smear due: 2027 Postpartum course has been uncomplicated. Bleeding no bleeding. Bowel function is normal. Bladder function is normal. Urinary incontinence? No, fecal incontinence? No Patient is sexually active.   Upstream - 07/02/23 0902       Pregnancy Intention Screening   Does the patient want to become pregnant in the next year? No    Does the patient's partner want to become pregnant in the next year? No    Would the patient like to discuss contraceptive options today? No      Contraception Wrap Up   Current Method Female Sterilization    End Method Female Sterilization    Contraception Counseling Provided No            The pregnancy intention screening data noted above was reviewed. Potential methods of contraception were discussed. The patient elected to proceed with Female Sterilization.  Edinburgh Postpartum Depression Screening: Positive Feel emotional, cries a lot, noises stress her out.  Offered counseling, filled out [facility] assessment.  Denies SI/HI.  Agrees to let me know if she begins to have SI/plans. Does not think that would ever happen.   Edinburgh  Postnatal Depression Scale - 07/02/23 0900       Edinburgh Postnatal Depression Scale:  In the Past 7 Days   I have been able to laugh and see the funny side of things. 1    I have looked forward with enjoyment to things. 2    I have blamed myself unnecessarily when things went wrong. 2    I have been anxious or worried for no good reason. 2    I have felt scared or panicky for no good reason. 2    Things have been getting on top of me. 2    I have been so unhappy that I have had difficulty sleeping. 2    I have felt sad or miserable. 2    I have been so unhappy that I have been crying. 2    The thought of harming myself has occurred to me. 0    Edinburgh Postnatal Depression Scale Total 17            Baby's course has been uncomplicated. Baby is feeding by bottle. Infant has a pediatrician/family doctor? Yes.  Childcare strategy if returning to work/school: yes.  Pt has material needs met for her and baby: Yes.   Review of Systems:   Pertinent items are noted in HPI Denies Abnormal vaginal discharge w/ itching/odor/irritation, headaches, visual changes, shortness of breath, chest pain, abdominal pain, severe nausea/vomiting, or problems with urination or bowel movements. Pertinent History Reviewed:  Reviewed  past medical,surgical, obstetrical and family history.  Reviewed problem list, medications and allergies. OB History  Gravida Para Term Preterm AB Living  2 2 2     2   SAB IAB Ectopic Multiple Live Births        0 2    # Outcome Date GA Lbr Len/2nd Weight Sex Type Anes PTL Lv  2 Term 05/21/23 [redacted]w[redacted]d  7 lb 14.6 oz (3.59 kg) F CS-Vac Spinal  LIV  1 Term 05/23/18 [redacted]w[redacted]d  6 lb 15 oz (3.147 kg) F CS-LTranv EPI N LIV     Complications: Failure to Progress in First Stage, Pre-eclampsia, Gestational diabetes   Physical Assessment:   Vitals:   07/02/23 0857  BP: 111/71  Pulse: 72  Weight: 241 lb (109.3 kg)  Height: 5\' 1"  (1.549 m)  Body mass index is 45.54  kg/m.  Objective:  Blood pressure 111/71, pulse 72, height 5\' 1"  (1.549 m), weight 241 lb (109.3 kg), last menstrual period 08/31/2022, not currently breastfeeding.  General:  alert, cooperative, and no distress   Breasts:  negative  Lungs: Normal respiratory effort  Heart:  regular rate and rhythm  Abdomen: soft, non-tender, incision well healed   Vulva:  not evaluated  Vagina: not evaluated  Cervix:  normal  Corpus: Well involuted  Adnexa:  not evaluated  Rectal Exam: no hemorrhoids          No results found for this or any previous visit (from the past 24 hour(s)).  Assessment & Plan:  1) Postpartum exam 2) 6 wks s/p repeat cesarean section, low transverse incision 3) bottle feeding 4) Depression screening 5) Contraception Had BTL  Essential components of care per ACOG recommendations:  1.  Mood and well being:  If positive depression screen, discussed and plan developed.  If using tobacco we discussed reduction/cessation and risk of relapse If current substance abuse, we discussed and referral to local resources was offered.   2. Infant care and feeding:  If breastfeeding, discussed returning to work, pumping, breastfeeding-associated pain, guidance regarding return to fertility while lactating if not using another method. If needed, patient was provided with a letter to be allowed to pump q 2-3hrs to support lactation in a private location with access to a refrigerator to store breastmilk.   Recommended that all caregivers be immunized for flu, pertussis and other preventable communicable diseases If pt does not have material needs met for her/baby, referred to local resources for help obtaining these.  3. Sexuality, contraception and birth spacing Provided guidance regarding sexuality, management of dyspareunia, and resumption of intercourse Discussed avoiding interpregnancy interval <53mths and recommended birth spacing of 18 months  4. Sleep and fatigue Discussed  coping options for fatigue and sleep disruption Encouraged family/partner/community support of 4 hrs of uninterrupted sleep to help with mood and fatigue  5. Physical recovery  If pt had a C/S, assessed incisional pain and providing guidance on normal vs prolonged recovery If pt had a laceration, perineal healing and pain reviewed.  If urinary or fecal incontinence, discussed management and referred to PT or uro/gyn if indicated  Patient is safe to resume physical activity. Discussed attainment of healthy weight.  6.  Chronic disease management Discussed pregnancy complications if any, and their implications for future childbearing and long-term maternal health. Review recommendations for prevention of recurrent pregnancy complications, such as aspirin to reduce risk of preeclampsia yes. Pt had GDM: Yes. If yes, 2hr GTT scheduled: yes. Reviewed medications and non-pregnant dosing including consideration of whether  pt is breastfeeding using a reliable resource such as LactMed: not applicable Referred for f/u w/ PCP or subspecialist providers as indicated: not applicable  7. Health maintenance Mammogram at 34yo or earlier if indicated Pap smears as indicated  Meds: No orders of the defined types were placed in this encounter.   Follow-up: Return for If you have any problems.   No orders of the defined types were placed in this encounter.      Jacklyn Shell DNP, CNM [facility] [employer], [facility] 07/02/2023 9:31 AM

## 2023-07-11 DIAGNOSIS — F53 Postpartum depression: Secondary | ICD-10-CM | POA: Diagnosis not present

## 2023-07-11 DIAGNOSIS — F411 Generalized anxiety disorder: Secondary | ICD-10-CM | POA: Diagnosis not present

## 2023-07-25 DIAGNOSIS — R635 Abnormal weight gain: Secondary | ICD-10-CM | POA: Diagnosis not present

## 2023-07-25 DIAGNOSIS — F53 Postpartum depression: Secondary | ICD-10-CM | POA: Diagnosis not present

## 2023-08-15 DIAGNOSIS — F411 Generalized anxiety disorder: Secondary | ICD-10-CM | POA: Diagnosis not present

## 2023-08-15 DIAGNOSIS — F53 Postpartum depression: Secondary | ICD-10-CM | POA: Diagnosis not present

## 2023-09-12 DIAGNOSIS — F53 Postpartum depression: Secondary | ICD-10-CM | POA: Diagnosis not present

## 2023-09-12 DIAGNOSIS — F411 Generalized anxiety disorder: Secondary | ICD-10-CM | POA: Diagnosis not present

## 2023-09-26 DIAGNOSIS — F411 Generalized anxiety disorder: Secondary | ICD-10-CM | POA: Diagnosis not present

## 2023-09-26 DIAGNOSIS — F53 Postpartum depression: Secondary | ICD-10-CM | POA: Diagnosis not present

## 2023-10-10 DIAGNOSIS — F411 Generalized anxiety disorder: Secondary | ICD-10-CM | POA: Diagnosis not present

## 2023-10-10 DIAGNOSIS — F53 Postpartum depression: Secondary | ICD-10-CM | POA: Diagnosis not present

## 2023-12-27 ENCOUNTER — Telehealth: Payer: Self-pay | Admitting: Obstetrics & Gynecology

## 2023-12-27 NOTE — Telephone Encounter (Signed)
 Had c-section & tubal back in September of last year. Normal periods up until January and then none til yesterday. Started spotted yesterday. During night started having really bad cramps and bleeding very heavily. Going through a tampon every hour. Please advise.

## 2023-12-27 NOTE — Telephone Encounter (Signed)
 Spoke with patient, she is having  a heavy period. Recommended ov to be evalutated. Pt already had an appointment booked for tomorrow.

## 2023-12-28 ENCOUNTER — Ambulatory Visit: Admitting: Adult Health

## 2023-12-28 ENCOUNTER — Encounter: Payer: Self-pay | Admitting: Adult Health

## 2023-12-28 VITALS — BP 135/90 | HR 69 | Ht 61.0 in | Wt 243.0 lb

## 2023-12-28 DIAGNOSIS — R5383 Other fatigue: Secondary | ICD-10-CM

## 2023-12-28 DIAGNOSIS — Z8632 Personal history of gestational diabetes: Secondary | ICD-10-CM | POA: Diagnosis not present

## 2023-12-28 DIAGNOSIS — I10 Essential (primary) hypertension: Secondary | ICD-10-CM | POA: Diagnosis not present

## 2023-12-28 DIAGNOSIS — Z3202 Encounter for pregnancy test, result negative: Secondary | ICD-10-CM | POA: Insufficient documentation

## 2023-12-28 DIAGNOSIS — N946 Dysmenorrhea, unspecified: Secondary | ICD-10-CM

## 2023-12-28 DIAGNOSIS — Z131 Encounter for screening for diabetes mellitus: Secondary | ICD-10-CM | POA: Diagnosis not present

## 2023-12-28 DIAGNOSIS — N921 Excessive and frequent menstruation with irregular cycle: Secondary | ICD-10-CM

## 2023-12-28 LAB — POCT URINE PREGNANCY: Preg Test, Ur: NEGATIVE

## 2023-12-28 MED ORDER — MEGESTROL ACETATE 40 MG PO TABS: ORAL_TABLET | ORAL | 0 refills | Status: DC

## 2023-12-28 NOTE — Progress Notes (Addendum)
 Subjective:     Patient ID: Allison Garcia, female   DOB: [birth year], 33 y.o.   MRN: [id number]  HPI [name] is a 33 year old white female, single, [id number], in complaining of PMP January, missed February and March and started bleeding 12/26/23 has been heavy with clots and had bad cramps yesterday, hurt right side into back,better today and has felt tired.      Component Value Date/Time   DIAGPAP  12/26/2022 0912    - Negative for intraepithelial lesion or malignancy (NILM)   HPVHIGH Negative 12/26/2022 0912   ADEQPAP  12/26/2022 0912    Satisfactory for evaluation; transformation zone component PRESENT.    Review of Systems PMP January, missed February and March and started bleeding 12/26/23 has been heavy with clots   Bad cramps yesterday, hurt right side into back, took tylenol  and ibuprofen  and helped some  +tired Denies any fever or nausea  Reviewed past medical,surgical, social and family history. Reviewed medications and allergies.  Objective:   Physical Exam BP (!) 135/90 (BP Location: Right Arm, Patient Position: Sitting, Cuff Size: Large)   Pulse 69   Ht 5\' 1"  (1.549 m)   Wt 243 lb (110.2 kg)   LMP 12/26/2023   Breastfeeding No   BMI 45.91 kg/m  UPT is negative Skin warm and dry.Pelvic: external genitalia is normal in appearance no lesions, vagina: +blood,urethra has no lesions or masses noted, cervix:smooth, uterus: normal size, shape and contour, non tender, no masses felt, adnexa: no masses, RLQ tenderness noted. Bladder is non tender and no masses felt.    Fall risk is low  Upstream - 12/28/23 1006       Pregnancy Intention Screening   Does the patient want to become pregnant in the next year? No    Does the patient's partner want to become pregnant in the next year? No    Would the patient like to discuss contraceptive options today? No      Contraception Wrap Up   Current Method Female Sterilization    End Method Female Sterilization    Contraception  Counseling Provided No            Examination chaperoned by Alphonso Aschoff LPN  Assessment:     1. Pregnancy examination or test, negative result - POCT urine pregnancy  2. Menorrhagia with irregular cycle (Primary) PMP January, missed February and March and started bleeding 12/26/23 has been heavy with clots Will check labs and get US  in office in about 2 weeks Will rx megace to see if can stop the bleeding Meds ordered this encounter  Medications   megestrol (MEGACE) 40 MG tablet    Sig: Take 3 tablets x 5 days then 2 x 5 days then 1 day til bleeding stops    Dispense:  45 tablet    Refill:  0    Supervising Provider:   Evalyn Hillier H [[id number]]    - US  PELVIC COMPLETE WITH TRANSVAGINAL; Future - CBC - TSH + free T4  3. Dysmenorrhea Had bad cramps and pain yesterday, better today, it hurt right side into back  Taking tylenol  and ibuprofen  - US  PELVIC COMPLETE WITH TRANSVAGINAL; Future  4. Chronic hypertension Not on meds BP recheck 133/84 - Comprehensive metabolic panel with GFR  5. Screening for diabetes mellitus - Hemoglobin A1c  6. History of gestational diabetes - Hemoglobin A1c  7. Tired +tired Will check labs  - CBC - TSH + free T4     Plan:  IF pain increases, any fever or vomiting go to ER  Return in 2 weeks for pelvic US  in office

## 2023-12-29 LAB — COMPREHENSIVE METABOLIC PANEL WITH GFR
ALT: 38 IU/L — ABNORMAL HIGH (ref 0–32)
AST: 24 IU/L (ref 0–40)
Albumin: 4.4 g/dL (ref 3.9–4.9)
Alkaline Phosphatase: 56 IU/L (ref 44–121)
BUN/Creatinine Ratio: 16 (ref 9–23)
BUN: 12 mg/dL (ref 6–20)
Bilirubin Total: 0.4 mg/dL (ref 0.0–1.2)
CO2: 21 mmol/L (ref 20–29)
Calcium: 9.5 mg/dL (ref 8.7–10.2)
Chloride: 103 mmol/L (ref 96–106)
Creatinine, Ser: 0.73 mg/dL (ref 0.57–1.00)
Globulin, Total: 2.7 g/dL (ref 1.5–4.5)
Glucose: 84 mg/dL (ref 70–99)
Potassium: 4.4 mmol/L (ref 3.5–5.2)
Sodium: 140 mmol/L (ref 134–144)
Total Protein: 7.1 g/dL (ref 6.0–8.5)
eGFR: 111 mL/min/{1.73_m2} (ref >59)

## 2023-12-29 LAB — CBC
Hematocrit: 45.1 % (ref 34.0–46.6)
Hemoglobin: 14.8 g/dL (ref 11.1–15.9)
MCH: 28.9 pg (ref 26.6–33.0)
MCHC: 32.8 g/dL (ref 31.5–35.7)
MCV: 88 fL (ref 79–97)
Platelets: 251 10*3/uL (ref 150–450)
RBC: 5.12 x10E6/uL (ref 3.77–5.28)
RDW: 13.5 % (ref 11.7–15.4)
WBC: 7.7 10*3/uL (ref 3.4–10.8)

## 2023-12-29 LAB — HEMOGLOBIN A1C
Est. average glucose Bld gHb Est-mCnc: 111 mg/dL
Hgb A1c MFr Bld: 5.5 % (ref 4.8–5.6)

## 2023-12-29 LAB — TSH+FREE T4
Free T4: 1.22 ng/dL (ref 0.82–1.77)
TSH: 0.58 u[IU]/mL (ref 0.450–4.500)

## 2024-01-17 ENCOUNTER — Other Ambulatory Visit

## 2024-01-31 ENCOUNTER — Other Ambulatory Visit: Payer: Self-pay | Admitting: Adult Health

## 2024-01-31 DIAGNOSIS — Z3202 Encounter for pregnancy test, result negative: Secondary | ICD-10-CM

## 2024-01-31 DIAGNOSIS — N946 Dysmenorrhea, unspecified: Secondary | ICD-10-CM

## 2024-01-31 DIAGNOSIS — Z8632 Personal history of gestational diabetes: Secondary | ICD-10-CM

## 2024-01-31 DIAGNOSIS — R5383 Other fatigue: Secondary | ICD-10-CM

## 2024-01-31 DIAGNOSIS — N921 Excessive and frequent menstruation with irregular cycle: Secondary | ICD-10-CM

## 2024-01-31 DIAGNOSIS — Z131 Encounter for screening for diabetes mellitus: Secondary | ICD-10-CM

## 2024-01-31 DIAGNOSIS — Z9851 Tubal ligation status: Secondary | ICD-10-CM

## 2024-01-31 DIAGNOSIS — I10 Essential (primary) hypertension: Secondary | ICD-10-CM

## 2024-02-04 ENCOUNTER — Ambulatory Visit: Admitting: Radiology

## 2024-02-04 DIAGNOSIS — Z9851 Tubal ligation status: Secondary | ICD-10-CM | POA: Diagnosis not present

## 2024-02-04 DIAGNOSIS — N921 Excessive and frequent menstruation with irregular cycle: Secondary | ICD-10-CM | POA: Diagnosis not present

## 2024-02-04 DIAGNOSIS — N946 Dysmenorrhea, unspecified: Secondary | ICD-10-CM | POA: Diagnosis not present

## 2024-02-04 NOTE — Progress Notes (Signed)
 GYN US : TA and TV imaging performed - vinyl probe cover used - Chaperone: Emma Anteverted uterus normal in size, symmetrical, homogeneous myometrium, no focal abn seen Endom thickness = 5.3 mm, uniform avascular cavity and canal, no evidence of intracavitary defects Both ovaries seen with numerous small follicles along peripheral aspect of ovaries, echopattern c/w with PCOS.The ovaries appear mobile, neg adnexal regions, neg CDS, no free fluid present

## 2024-02-05 ENCOUNTER — Ambulatory Visit: Payer: Self-pay | Admitting: Adult Health

## 2024-02-12 ENCOUNTER — Other Ambulatory Visit: Payer: Self-pay | Admitting: Adult Health

## 2024-02-25 ENCOUNTER — Ambulatory Visit: Admitting: Obstetrics & Gynecology

## 2024-02-25 ENCOUNTER — Encounter: Payer: Self-pay | Admitting: Obstetrics & Gynecology

## 2024-02-25 VITALS — BP 135/86 | HR 96 | Ht 61.0 in | Wt 245.2 lb

## 2024-02-25 DIAGNOSIS — Z98891 History of uterine scar from previous surgery: Secondary | ICD-10-CM

## 2024-02-25 DIAGNOSIS — I10 Essential (primary) hypertension: Secondary | ICD-10-CM | POA: Diagnosis not present

## 2024-02-25 DIAGNOSIS — N921 Excessive and frequent menstruation with irregular cycle: Secondary | ICD-10-CM | POA: Diagnosis not present

## 2024-02-25 NOTE — Progress Notes (Signed)
 GYN VISIT Patient name: Allison Garcia MRN [id number]  Date of birth: 1991/05/26 Chief Complaint:   consultation  History of Present Illness:   Allison Garcia is a 33 y.o. G42P2002 female being seen today for the following concerns:  -Menses are awful- sometimes menses 10-14 days- soaking through period panties+pad+tampon in under 30-1hr for 2-3 days then will slow down for a few days then may pick back up before it stops.  Menses also irregular- will stop for a few days at most a month before it restarts.   +dysmenorrhea  Patient has completed childbearing as she recently had a salpingectomy completed as part of her repeat C-section  Of note prior C-section was performed 05/2023 (~ 9mos ago)  Recent ultrasound completed 02-2024: 5.2 x 4.8 x 5.7 cm uterus.  Volume 75 cc.  No abnormalities noted.  Normal symmetrical endometrium no abnormalities.  Normal ovaries bilaterally     Patient's last menstrual period was 02/21/2024 (exact date).    Review of Systems:   Pertinent items are noted in HPI Denies fever/chills, dizziness, headaches, visual disturbances, fatigue, shortness of breath, chest pain, abdominal pain, vomiting Pertinent History Reviewed:   Past Surgical History:  Procedure Laterality Date   CESAREAN SECTION     CESAREAN SECTION WITH BILATERAL TUBAL LIGATION N/A 05/21/2023   Procedure: CESAREAN SECTION WITH BILATERAL TUBAL LIGATION;  Surgeon: Lola Donnice HERO, MD;  Location: [facility] LD ORS;  Service: Obstetrics;  Laterality: N/A;   ESOPHAGOGASTRODUODENOSCOPY (EGD) WITH PROPOFOL  N/A 02/05/2020   Procedure: ESOPHAGOGASTRODUODENOSCOPY (EGD) WITH PROPOFOL ;  Surgeon: Shaaron Lamar HERO, MD;  Erosive reflux esophagitis, normal examined stomach, normal examined duodenum.   TEAR DUCT PROBING     as baby    Past Medical History:  Diagnosis Date   Abnormal Pap smear of cervix    Back pain    GERD (gastroesophageal reflux disease) 03/22/2020   Gestational diabetes    History of  stomach ulcers    told by ED. no EGD   Preeclampsia    Pregnancy induced hypertension    Reviewed problem list, medications and allergies. Physical Assessment:   Vitals:   02/25/24 1541  BP: 135/86  Pulse: 96  Weight: 245 lb 3.2 oz (111.2 kg)  Height: 5' 1 (1.549 m)  Body mass index is 46.33 kg/m.       Physical Examination:   General appearance: alert, well appearing, and in no distress  Psych: mood appropriate, normal affect  Skin: warm & dry   Cardiovascular: normal heart rate noted  Respiratory: normal respiratory effort, no distress  Pelvic: examination not indicated  Extremities: no edema   Chaperone: N/A    Assessment & Plan:  1) AUB/HMB - Reviewed recent ultrasound that showed no abnormalities - Due to medical comorbidities, focused on progesterone only options.  Discussed Pops, Depo, IUD - Patient has had Kyleena in the past with good results - Discussed endometrial ablation.  Reviewed my concerns regarding recent C-section is a relative contraindication and potential complications or failure of ability to complete ablation - Discussed robotic assisted hysterectomy.  Reviewed risk benefit including but not limited to risk of bleeding, infection, injury.  Discussed potential surgical complications including fistula or dehiscence.  Discussed that in light of recent delivery with a new baby at home reviewed my concerns regarding pelvic rest and recovery during the 8 to 12-week timeframe - After much discussion she is not sure how she wishes to proceed at this time.  Patient given list of all of  her options and will let us  know what she decides   No orders of the defined types were placed in this encounter.   Return for tbd.   Erick Murin, DO Attending Obstetrician & Gynecologist, [facility] [employer], [facility]

## 2024-03-04 ENCOUNTER — Encounter: Payer: Self-pay | Admitting: Obstetrics & Gynecology

## 2024-03-05 ENCOUNTER — Encounter: Payer: Self-pay | Admitting: Obstetrics & Gynecology

## 2024-03-06 ENCOUNTER — Encounter: Payer: Self-pay | Admitting: Obstetrics & Gynecology

## 2024-03-07 DIAGNOSIS — L089 Local infection of the skin and subcutaneous tissue, unspecified: Secondary | ICD-10-CM | POA: Diagnosis not present

## 2024-03-07 DIAGNOSIS — Z23 Encounter for immunization: Secondary | ICD-10-CM | POA: Diagnosis not present

## 2024-03-07 DIAGNOSIS — T148XXA Other injury of unspecified body region, initial encounter: Secondary | ICD-10-CM | POA: Diagnosis not present

## 2024-03-29 DIAGNOSIS — H5213 Myopia, bilateral: Secondary | ICD-10-CM | POA: Diagnosis not present

## 2024-04-02 DIAGNOSIS — H5213 Myopia, bilateral: Secondary | ICD-10-CM | POA: Diagnosis not present

## 2024-04-02 NOTE — Patient Instructions (Signed)
 Allison Garcia  04/02/2024     @PREFPERIOPPHARMACY @   Your procedure is scheduled on  04/08/2024.    Report to [facility] at  0600  A.M.   Call this number if you have problems the morning of surgery:  [phone]  If you experience any cold or flu symptoms such as cough, fever, chills, shortness of breath, etc. between now and your scheduled surgery, please notify us  at the above number.   Remember:        Use your inhaler before you come and bring your rescue inhaler with you.   Do not eat after midnight.   You may drink clear liquids until 0330 am on 04/08/2024.       Clear liquids allowed are:                    Water , Juice (No red color; non-citric and without pulp; diabetics please choose diet or no sugar options), Carbonated beverages (diabetics please choose diet or no sugar options), Clear Tea (No creamer, milk, or cream, including half & half and powdered creamer), Black Coffee Only (No creamer, milk or cream, including half & half and powdered creamer), and Clear Sports drink (No red color; diabetics please choose diet or no sugar options)    Take these medicines the morning of surgery with A SIP OF WATER                                                     omeprazole.    Do not wear jewelry, make-up or nail polish, including gel polish,  artificial nails, or any other type of covering on natural nails (fingers and  toes).  Do not wear lotions, powders, or perfumes, or deodorant.  Do not shave 48 hours prior to surgery.  Men may shave face and neck.  Do not bring valuables to the hospital.  [facility] is not responsible for any belongings or valuables.  Contacts, dentures or bridgework may not be worn into surgery.  Leave your suitcase in the car.  After surgery it may be brought to your room.  For patients admitted to the hospital, discharge time will be determined by your treatment team.  Patients discharged the day of surgery will not be allowed to  drive home and must have someone with them for 24 hours.    Special instructions:    DO NOT smoke tobacco or vape for 24 hours before your procedure.  Please read over the following fact sheets that you were given. Pain Booklet, Coughing and Deep Breathing, Blood Transfusion Information, Lab Information, Surgical Site Infection Prevention, Anesthesia Post-op Instructions, and Care and Recovery After Surgery      Laparoscopically Assisted Vaginal Hysterectomy, Care After After a LAVH, it's common to have soreness and numbness in your incision areas. You'll have pain in your belly. You may also have: Bleeding and discharge from the vagina. Tiredness. Sadness and other emotions. If your ovaries were taken out, you may also have symptoms of menopause, such as hot flashes, night sweats, and lack of sleep. Follow these instructions at home: Medicines Take over-the-counter and prescription medicines only as told by your health care provider. If you were given antibiotics, take them as told by your provider. Do not stop taking the antibiotic even  if you start to feel better. Ask your provider if the medicine prescribed to you: Requires you to avoid driving or using machinery. Can cause constipation. You may need to take these actions to prevent or treat constipation: Drink enough fluid to keep your pee (urine) pale yellow. Take over-the-counter or prescription medicines. Eat foods that are high in fiber, such as beans, whole grains, and fresh fruits and vegetables. Limit foods that are high in fat and processed sugars, such as fried or sweet foods. Incision care  Follow instructions from your provider about how to take care of your incisions. Make sure you: Wash your hands with soap and water  for at least 20 seconds before and after you change your bandage. If soap and water  aren't available, use hand sanitizer. Change your dressing as told by your provider. Leave stitches, skin glue, or  tape strips in place. These skin closures may need to stay in place for 2 weeks or longer. If tape strip edges start to loosen and curl up, you may trim the loose edges. Do not remove tape strips completely unless your provider tells you to do that. Check your incision areas every day for signs of infection. Check for: More redness, swelling, or pain. More fluid or blood. Warmth. Pus or a bad smell. Activity  Rest as told by your provider. Do not sit for a long time without moving. Get up to take short walks every 1-2 hours. This will improve blood flow and breathing. Ask for help if you feel weak or unsteady. You may have to avoid lifting. Ask your provider how much you can safely lift. Return to your normal activities as told by your provider. Ask your provider what activities are safe for you. Lifestyle Do not use any products that contain nicotine or tobacco. These products include cigarettes, chewing tobacco, and vaping devices, such as e-cigarettes. These can delay healing after surgery. If you need help quitting, ask your provider. Do not drink alcohol until your provider approves. General instructions Do not douche, use tampons, have sex, or put anything in the vagina for at least 6 weeks. If you struggle with physical or emotional changes after your procedure, speak with your provider or a therapist. Do not take baths, swim, or use a hot tub until your provider approves. Ask your provider if you may take showers. You may only be allowed to take sponge baths. Try to have someone at home with you for the first 1-2 weeks to help with your daily chores. Wear compression stockings as told by your provider. These stockings help to prevent blood clots and reduce swelling in your legs. Your provider may give you more instructions. Make sure you know what you can and can't do. Contact a health care provider if: You have any signs of infection. You have pain and your pain medicine doesn't  help. You feel dizzy or light-headed. You have trouble peeing. You vomit or feel like you may vomit, and the symptoms do not go away. You have pus or discharge from your vagina that smells bad. Get help right away if: You have a fever and your symptoms suddenly get worse. You have very bad pain in the abdomen. You have chest pain or shortness of breath. You faint. You have pain, swelling, or redness in your leg. You have heavy bleeding in your vagina that soaks through a pad in less than 1 hour. You see blood clots in your bleeding. These symptoms may be an emergency. Get help  right away. Call 911. Do not wait to see if the symptoms will go away. Do not drive yourself to the hospital. This information is not intended to replace advice given to you by your health care provider. Make sure you discuss any questions you have with your health care provider. Document Revised: 12/01/2022 Document Reviewed: 12/01/2022 Elsevier Patient Education  2024 Elsevier Inc.General Anesthesia, Adult, Care After The following information offers guidance on how to care for yourself after your procedure. Your health care provider may also give you more specific instructions. If you have problems or questions, contact your health care provider. What can I expect after the procedure? After the procedure, it is common for people to: Have pain or discomfort at the IV site. Have nausea or vomiting. Have a sore throat or hoarseness. Have trouble concentrating. Feel cold or chills. Feel weak, sleepy, or tired (fatigue). Have soreness and body aches. These can affect parts of the body that were not involved in surgery. Follow these instructions at home: For the time period you were told by your health care provider:  Rest. Do not participate in activities where you could fall or become injured. Do not drive or use machinery. Do not drink alcohol. Do not take sleeping pills or medicines that cause  drowsiness. Do not make important decisions or sign legal documents. Do not take care of children on your own. General instructions Drink enough fluid to keep your urine pale yellow. If you have sleep apnea, surgery and certain medicines can increase your risk for breathing problems. Follow instructions from your health care provider about wearing your sleep device: Anytime you are sleeping, including during daytime naps. While taking prescription pain medicines, sleeping medicines, or medicines that make you drowsy. Return to your normal activities as told by your health care provider. Ask your health care provider what activities are safe for you. Take over-the-counter and prescription medicines only as told by your health care provider. Do not use any products that contain nicotine or tobacco. These products include cigarettes, chewing tobacco, and vaping devices, such as e-cigarettes. These can delay incision healing after surgery. If you need help quitting, ask your health care provider. Contact a health care provider if: You have nausea or vomiting that does not get better with medicine. You vomit every time you eat or drink. You have pain that does not get better with medicine. You cannot urinate or have bloody urine. You develop a skin rash. You have a fever. Get help right away if: You have trouble breathing. You have chest pain. You vomit blood. These symptoms may be an emergency. Get help right away. Call 911. Do not wait to see if the symptoms will go away. Do not drive yourself to the hospital. Summary After the procedure, it is common to have a sore throat, hoarseness, nausea, vomiting, or to feel weak, sleepy, or fatigue. For the time period you were told by your health care provider, do not drive or use machinery. Get help right away if you have difficulty breathing, have chest pain, or vomit blood. These symptoms may be an emergency. This information is not intended to  replace advice given to you by your health care provider. Make sure you discuss any questions you have with your health care provider. Document Revised: 11/18/2021 Document Reviewed: 11/18/2021 Elsevier Patient Education  2024 Elsevier Inc.How to Use Chlorhexidine  at Home in the Shower Chlorhexidine  gluconate (CHG) is a germ-killing (antiseptic) wash that's used to clean the skin. It can  get rid of the germs that normally live on the skin and can keep them away for about 24 hours. If you're having surgery, you may be told to shower with CHG at home the night before surgery. This can help lower your risk for infection. To use CHG wash in the shower, follow the steps below. Supplies needed: CHG body wash. Clean washcloth. Clean towel. How to use CHG in the shower Follow these steps unless you're told to use CHG in a different way: Start the shower. Use your normal soap and shampoo to wash your face and hair. Turn off the shower or move out of the shower stream. Pour CHG onto a clean washcloth. Do not use any type of brush or rough sponge. Start at your neck, washing your body down to your toes. Make sure you: Wash the part of your body where the surgery will be done for at least 1 minute. Do not scrub. Do not use CHG on your head or face unless your health care provider tells you to. If it gets into your ears or eyes, rinse them well with water . Do not wash your genitals with CHG. Wash your back and under your arms. Make sure to wash skin folds. Let the CHG sit on your skin for 1-2 minutes or as long as told. Rinse your entire body in the shower, including all body creases and folds. Turn off the shower. Dry off with a clean towel. Do not put anything on your skin afterward, such as powder, lotion, or perfume. Put on clean clothes or pajamas. If it's the night before surgery, sleep in clean sheets. General tips Use CHG only as told, and follow the instructions on the label. Use the full  amount of CHG as told. This is often one bottle. Do not smoke and stay away from flames after using CHG. Your skin may feel sticky after using CHG. This is normal. The sticky feeling will go away as the CHG dries. Do not use CHG: If you have a chlorhexidine  allergy or have reacted to chlorhexidine  in the past. On open wounds or areas of skin that have broken skin, cuts, or scrapes. On babies younger than 13 months of age. Contact a health care provider if: You have questions about using CHG. Your skin gets irritated or itchy. You have a rash after using CHG. You swallow any CHG. Call your local poison control center [phone] in the [place] Your eyes itch badly, or they become very red or swollen. Your hearing changes. You have trouble seeing. If you can't reach your provider, go to an urgent care or emergency room. Do not drive yourself. Get help right away if: You have swelling or tingling in your mouth or throat. You make high-pitched whistling sounds when you breathe, most often when you breathe out (wheeze). You have trouble breathing. These symptoms may be an emergency. Call 911 right away. Do not wait to see if the symptoms will go away. Do not drive yourself to the hospital. This information is not intended to replace advice given to you by your health care provider. Make sure you discuss any questions you have with your health care provider. Document Revised: 03/06/2023 Document Reviewed: 03/02/2022 Elsevier Patient Education  2024 [employer].

## 2024-04-04 ENCOUNTER — Ambulatory Visit: Payer: Self-pay | Admitting: Obstetrics & Gynecology

## 2024-04-04 ENCOUNTER — Encounter (HOSPITAL_COMMUNITY)
Admission: RE | Admit: 2024-04-04 | Discharge: 2024-04-04 | Disposition: A | Discharge status: Home / Self Care | Source: Ambulatory Visit | Attending: Obstetrics & Gynecology | Admitting: Obstetrics & Gynecology

## 2024-04-04 ENCOUNTER — Encounter (HOSPITAL_COMMUNITY): Payer: Self-pay

## 2024-04-04 ENCOUNTER — Other Ambulatory Visit: Payer: Self-pay

## 2024-04-04 DIAGNOSIS — Z01812 Encounter for preprocedural laboratory examination: Secondary | ICD-10-CM | POA: Insufficient documentation

## 2024-04-04 DIAGNOSIS — N921 Excessive and frequent menstruation with irregular cycle: Secondary | ICD-10-CM | POA: Insufficient documentation

## 2024-04-04 LAB — CBC
HCT: 43.7 % (ref 36.0–46.0)
Hemoglobin: 14.7 g/dL (ref 12.0–15.0)
MCH: 29.4 pg (ref 26.0–34.0)
MCHC: 33.6 g/dL (ref 30.0–36.0)
MCV: 87.4 fL (ref 80.0–100.0)
Platelets: 253 K/uL (ref 150–400)
RBC: 5 MIL/uL (ref 3.87–5.11)
RDW: 13.2 % (ref 11.5–15.5)
WBC: 8.7 K/uL (ref 4.0–10.5)
nRBC: 0 % (ref 0.0–0.2)

## 2024-04-04 LAB — TYPE AND SCREEN
ABO/RH(D): A POS
Antibody Screen: NEGATIVE

## 2024-04-04 LAB — BASIC METABOLIC PANEL WITH GFR
Anion gap: 12 (ref 5–15)
BUN: 15 mg/dL (ref 6–20)
CO2: 20 mmol/L — ABNORMAL LOW (ref 22–32)
Calcium: 8.8 mg/dL — ABNORMAL LOW (ref 8.9–10.3)
Chloride: 107 mmol/L (ref 98–111)
Creatinine, Ser: 0.6 mg/dL (ref 0.44–1.00)
GFR, Estimated: >60 mL/min (ref >60)
Glucose, Bld: 100 mg/dL — ABNORMAL HIGH (ref 70–99)
Potassium: 3.9 mmol/L (ref 3.5–5.1)
Sodium: 139 mmol/L (ref 135–145)

## 2024-04-04 LAB — PREGNANCY, URINE: Preg Test, Ur: NEGATIVE

## 2024-04-04 NOTE — Progress Notes (Signed)
   04/04/24 0922  OBSTRUCTIVE SLEEP APNEA  Have you ever been diagnosed with sleep apnea through a sleep study? No  Do you snore loudly (loud enough to be heard through closed doors)?  1  Do you often feel tired, fatigued, or sleepy during the daytime (such as falling asleep during driving or talking to someone)? 1  Has anyone observed you stop breathing during your sleep? 0  Do you have, or are you being treated for high blood pressure? 1  BMI more than 35 kg/m2? 1  Age > 50 (1-yes) 0  Neck circumference greater than:Female 16 inches or larger, Female 17inches or larger? 1  Female Gender (Yes=1) 0  Obstructive Sleep Apnea Score 5  Score 5 or greater  Results sent to PCP

## 2024-04-08 ENCOUNTER — Encounter (HOSPITAL_COMMUNITY): Payer: Self-pay | Admitting: Obstetrics & Gynecology

## 2024-04-08 ENCOUNTER — Other Ambulatory Visit: Payer: Self-pay

## 2024-04-08 ENCOUNTER — Encounter (HOSPITAL_COMMUNITY)
Admission: RE | Disposition: A | Discharge status: Home / Self Care | Payer: Self-pay | Source: Home / Self Care | Attending: Obstetrics & Gynecology

## 2024-04-08 ENCOUNTER — Ambulatory Visit (HOSPITAL_COMMUNITY)

## 2024-04-08 ENCOUNTER — Ambulatory Visit (HOSPITAL_BASED_OUTPATIENT_CLINIC_OR_DEPARTMENT_OTHER)

## 2024-04-08 ENCOUNTER — Ambulatory Visit (HOSPITAL_COMMUNITY)
Admission: RE | Admit: 2024-04-08 | Discharge: 2024-04-08 | Disposition: A | Discharge status: Home / Self Care | Attending: Obstetrics & Gynecology | Admitting: Obstetrics & Gynecology

## 2024-04-08 DIAGNOSIS — N736 Female pelvic peritoneal adhesions (postinfective): Secondary | ICD-10-CM | POA: Diagnosis not present

## 2024-04-08 DIAGNOSIS — N888 Other specified noninflammatory disorders of cervix uteri: Secondary | ICD-10-CM | POA: Insufficient documentation

## 2024-04-08 DIAGNOSIS — Z8249 Family history of ischemic heart disease and other diseases of the circulatory system: Secondary | ICD-10-CM | POA: Insufficient documentation

## 2024-04-08 DIAGNOSIS — Z87891 Personal history of nicotine dependence: Secondary | ICD-10-CM | POA: Insufficient documentation

## 2024-04-08 DIAGNOSIS — K219 Gastro-esophageal reflux disease without esophagitis: Secondary | ICD-10-CM | POA: Insufficient documentation

## 2024-04-08 DIAGNOSIS — I10 Essential (primary) hypertension: Secondary | ICD-10-CM | POA: Insufficient documentation

## 2024-04-08 DIAGNOSIS — N946 Dysmenorrhea, unspecified: Secondary | ICD-10-CM | POA: Insufficient documentation

## 2024-04-08 DIAGNOSIS — N921 Excessive and frequent menstruation with irregular cycle: Secondary | ICD-10-CM | POA: Insufficient documentation

## 2024-04-08 DIAGNOSIS — N939 Abnormal uterine and vaginal bleeding, unspecified: Secondary | ICD-10-CM | POA: Insufficient documentation

## 2024-04-08 DIAGNOSIS — K660 Peritoneal adhesions (postprocedural) (postinfection): Secondary | ICD-10-CM | POA: Insufficient documentation

## 2024-04-08 DIAGNOSIS — D251 Intramural leiomyoma of uterus: Secondary | ICD-10-CM | POA: Diagnosis not present

## 2024-04-08 DIAGNOSIS — E66813 Obesity, class 3: Secondary | ICD-10-CM | POA: Insufficient documentation

## 2024-04-08 LAB — GLUCOSE, CAPILLARY: Glucose-Capillary: 151 mg/dL — ABNORMAL HIGH (ref 70–99)

## 2024-04-08 SURGERY — HYSTERECTOMY, TOTAL, LAPAROSCOPIC, ROBOT-ASSISTED WITH SALPINGECTOMY
Anesthesia: General | Site: Urethra | Laterality: Right

## 2024-04-08 MED ORDER — CEFAZOLIN SODIUM-DEXTROSE 2-4 GM/100ML-% IV SOLN
2.0000 g | INTRAVENOUS | Status: AC
  Administered 2024-04-08: 2 g via INTRAVENOUS
  Filled 2024-04-08: qty 100

## 2024-04-08 MED ORDER — ROCURONIUM BROMIDE 10 MG/ML (PF) SYRINGE
PREFILLED_SYRINGE | INTRAVENOUS | Status: DC | PRN
  Administered 2024-04-08: 50 mg via INTRAVENOUS
  Administered 2024-04-08: 30 mg via INTRAVENOUS
  Administered 2024-04-08: 20 mg via INTRAVENOUS
  Administered 2024-04-08: 70 mg via INTRAVENOUS

## 2024-04-08 MED ORDER — FENTANYL CITRATE (PF) 100 MCG/2ML IJ SOLN
INTRAMUSCULAR | Status: DC | PRN
  Administered 2024-04-08: 50 ug via INTRAVENOUS
  Administered 2024-04-08 (×2): 100 ug via INTRAVENOUS
  Administered 2024-04-08: 50 ug via INTRAVENOUS

## 2024-04-08 MED ORDER — STERILE WATER FOR IRRIGATION IR SOLN
Status: DC | PRN
Start: 2024-04-08 — End: 2024-04-08
  Administered 2024-04-08 (×2): 500 mL

## 2024-04-08 MED ORDER — CHLORHEXIDINE GLUCONATE 0.12 % MT SOLN
OROMUCOSAL | Status: AC
  Administered 2024-04-08: 15 mL via OROMUCOSAL
  Filled 2024-04-08: qty 60

## 2024-04-08 MED ORDER — ROCURONIUM BROMIDE 10 MG/ML (PF) SYRINGE
PREFILLED_SYRINGE | INTRAVENOUS | Status: AC
Start: 2024-04-08 — End: 2024-04-08
  Filled 2024-04-08: qty 10

## 2024-04-08 MED ORDER — FENTANYL CITRATE (PF) 100 MCG/2ML IJ SOLN
INTRAMUSCULAR | Status: AC
  Filled 2024-04-08: qty 2

## 2024-04-08 MED ORDER — PROPOFOL 10 MG/ML IV BOLUS
INTRAVENOUS | Status: AC
  Filled 2024-04-08: qty 20

## 2024-04-08 MED ORDER — HYDROMORPHONE HCL 1 MG/ML IJ SOLN: 0.2500 mg | INTRAMUSCULAR | Status: DC | PRN

## 2024-04-08 MED ORDER — MIDAZOLAM HCL 2 MG/2ML IJ SOLN
INTRAMUSCULAR | Status: AC
  Filled 2024-04-08: qty 2

## 2024-04-08 MED ORDER — SUGAMMADEX SODIUM 200 MG/2ML IV SOLN
INTRAVENOUS | Status: DC | PRN
  Administered 2024-04-08: 400 mg via INTRAVENOUS

## 2024-04-08 MED ORDER — ROCURONIUM BROMIDE 10 MG/ML (PF) SYRINGE
PREFILLED_SYRINGE | INTRAVENOUS | Status: AC
  Filled 2024-04-08: qty 10

## 2024-04-08 MED ORDER — OXYCODONE HCL 5 MG PO TABS: 5.0000 mg | ORAL_TABLET | Freq: Four times a day (QID) | ORAL | 0 refills | Status: AC | PRN

## 2024-04-08 MED ORDER — FENTANYL CITRATE (PF) 100 MCG/2ML IJ SOLN
INTRAMUSCULAR | Status: AC
Start: 2024-04-08 — End: 2024-04-08
  Filled 2024-04-08: qty 2

## 2024-04-08 MED ORDER — HYDROMORPHONE HCL 1 MG/ML IJ SOLN
INTRAMUSCULAR | Status: DC | PRN
  Administered 2024-04-08 (×2): .5 mg via INTRAVENOUS

## 2024-04-08 MED ORDER — IBUPROFEN 600 MG PO TABS: 600.0000 mg | ORAL_TABLET | Freq: Four times a day (QID) | ORAL | 0 refills | Status: AC | PRN

## 2024-04-08 MED ORDER — PHENYLEPHRINE 80 MCG/ML (10ML) SYRINGE FOR IV PUSH (FOR BLOOD PRESSURE SUPPORT)
PREFILLED_SYRINGE | INTRAVENOUS | Status: DC | PRN
  Administered 2024-04-08 (×5): 160 ug via INTRAVENOUS

## 2024-04-08 MED ORDER — SODIUM CHLORIDE 0.9 % IR SOLN
Status: DC | PRN
  Administered 2024-04-08: 3000 mL
  Administered 2024-04-08: 1000 mL

## 2024-04-08 MED ORDER — ONDANSETRON HCL 4 MG/2ML IJ SOLN
INTRAMUSCULAR | Status: DC | PRN
  Administered 2024-04-08: 4 mg via INTRAVENOUS

## 2024-04-08 MED ORDER — BUPIVACAINE HCL (PF) 0.25 % IJ SOLN
INTRAMUSCULAR | Status: DC | PRN
Start: 2024-04-08 — End: 2024-04-08
  Administered 2024-04-08: 40 mL

## 2024-04-08 MED ORDER — POVIDONE-IODINE 10 % EX SWAB
2.0000 | Freq: Once | CUTANEOUS | Status: AC
  Administered 2024-04-08: 2 via TOPICAL

## 2024-04-08 MED ORDER — SUGAMMADEX SODIUM 200 MG/2ML IV SOLN
INTRAVENOUS | Status: AC
  Filled 2024-04-08: qty 4

## 2024-04-08 MED ORDER — LIDOCAINE 2% (20 MG/ML) 5 ML SYRINGE
INTRAMUSCULAR | Status: AC
  Filled 2024-04-08: qty 5

## 2024-04-08 MED ORDER — ONDANSETRON 4 MG PO TBDP
4.0000 mg | ORAL_TABLET | Freq: Three times a day (TID) | ORAL | 0 refills | Status: AC | PRN
Start: 2024-04-08 — End: ?

## 2024-04-08 MED ORDER — DOCUSATE SODIUM 100 MG PO CAPS
100.0000 mg | ORAL_CAPSULE | Freq: Two times a day (BID) | ORAL | 0 refills | Status: AC
Start: 2024-04-08 — End: 2024-04-28

## 2024-04-08 MED ORDER — HYDROMORPHONE HCL 1 MG/ML IJ SOLN
INTRAMUSCULAR | Status: AC
  Filled 2024-04-08: qty 0.5

## 2024-04-08 MED ORDER — KETOROLAC TROMETHAMINE 30 MG/ML IJ SOLN
INTRAMUSCULAR | Status: AC
  Filled 2024-04-08: qty 1

## 2024-04-08 MED ORDER — DEXAMETHASONE SODIUM PHOSPHATE 10 MG/ML IJ SOLN
INTRAMUSCULAR | Status: AC
  Filled 2024-04-08: qty 1

## 2024-04-08 MED ORDER — DEXAMETHASONE SODIUM PHOSPHATE 10 MG/ML IJ SOLN
INTRAMUSCULAR | Status: DC | PRN
  Administered 2024-04-08: 10 mg via INTRAVENOUS

## 2024-04-08 MED ORDER — BUPIVACAINE HCL (PF) 0.25 % IJ SOLN
INTRAMUSCULAR | Status: AC
  Filled 2024-04-08: qty 60

## 2024-04-08 MED ORDER — HEMOSTATIC AGENTS (NO CHARGE) OPTIME
TOPICAL | Status: DC | PRN
  Administered 2024-04-08: 1 via TOPICAL

## 2024-04-08 MED ORDER — DEXMEDETOMIDINE HCL IN NACL 80 MCG/20ML IV SOLN
INTRAVENOUS | Status: DC | PRN
  Administered 2024-04-08 (×3): 20 ug via INTRAVENOUS

## 2024-04-08 MED ORDER — CHLORHEXIDINE GLUCONATE 0.12 % MT SOLN: 15.0000 mL | Freq: Once | OROMUCOSAL | Status: AC

## 2024-04-08 MED ORDER — PROPOFOL 10 MG/ML IV BOLUS
INTRAVENOUS | Status: DC | PRN
  Administered 2024-04-08: 200 mg via INTRAVENOUS

## 2024-04-08 MED ORDER — CIPROFLOXACIN HCL 500 MG PO TABS: 500.0000 mg | ORAL_TABLET | Freq: Two times a day (BID) | ORAL | 0 refills | Status: AC

## 2024-04-08 MED ORDER — KETOROLAC TROMETHAMINE 30 MG/ML IJ SOLN
INTRAMUSCULAR | Status: DC | PRN
Start: 2024-04-08 — End: 2024-04-08
  Administered 2024-04-08: 30 mg via INTRAVENOUS

## 2024-04-08 MED ORDER — PHENYLEPHRINE 80 MCG/ML (10ML) SYRINGE FOR IV PUSH (FOR BLOOD PRESSURE SUPPORT)
PREFILLED_SYRINGE | INTRAVENOUS | Status: AC
  Filled 2024-04-08: qty 10

## 2024-04-08 MED ORDER — LACTATED RINGERS IV SOLN: INTRAVENOUS | Status: DC

## 2024-04-08 MED ORDER — HYDROCODONE-ACETAMINOPHEN 7.5-325 MG PO TABS: 1.0000 | ORAL_TABLET | Freq: Once | ORAL | Status: DC | PRN

## 2024-04-08 MED ORDER — ONDANSETRON HCL 4 MG/2ML IJ SOLN
INTRAMUSCULAR | Status: AC
  Filled 2024-04-08: qty 2

## 2024-04-08 MED ORDER — GABAPENTIN 300 MG PO CAPS: 300.0000 mg | ORAL_CAPSULE | Freq: Three times a day (TID) | ORAL | 0 refills | Status: DC

## 2024-04-08 MED ORDER — LIDOCAINE 2% (20 MG/ML) 5 ML SYRINGE
INTRAMUSCULAR | Status: DC | PRN
  Administered 2024-04-08: 100 mg via INTRAVENOUS

## 2024-04-08 MED ORDER — ORAL CARE MOUTH RINSE: 15.0000 mL | Freq: Once | OROMUCOSAL | Status: AC

## 2024-04-08 MED ORDER — SCOPOLAMINE 1 MG/3DAYS TD PT72
1.0000 | MEDICATED_PATCH | Freq: Once | TRANSDERMAL | Status: DC
  Administered 2024-04-08: 1.5 mg via TRANSDERMAL
  Filled 2024-04-08: qty 1

## 2024-04-08 MED ORDER — MIDAZOLAM HCL 2 MG/2ML IJ SOLN
INTRAMUSCULAR | Status: DC | PRN
  Administered 2024-04-08: 2 mg via INTRAVENOUS

## 2024-04-08 SURGICAL SUPPLY — 56 items
BAG URINE DRAIN 2000ML AR STRL (UROLOGICAL SUPPLIES) ×4 IMPLANT
BLADE SURG SZ11 CARB STEEL (BLADE) ×4 IMPLANT
CATH FOLEY 3WAY 30CC 16FR (CATHETERS) ×4 IMPLANT
CAUTERY HOOK MNPLR 1.6 DVNC XI (INSTRUMENTS) ×4 IMPLANT
CHLORAPREP W/TINT 26 (MISCELLANEOUS) ×4 IMPLANT
COVER LIGHT HANDLE (MISCELLANEOUS) IMPLANT
COVER MAYO STAND XLG (MISCELLANEOUS) ×4 IMPLANT
DERMABOND ADVANCED .7 DNX12 (GAUZE/BANDAGES/DRESSINGS) ×4 IMPLANT
DRAPE ARM DVNC X/XI (DISPOSABLE) ×16 IMPLANT
DRAPE COLUMN DVNC XI (DISPOSABLE) ×4 IMPLANT
DRAPE UTILITY W/TAPE 26X15 (DRAPES) ×8 IMPLANT
DRIVER NDL MEGA 8 DVNC XI (INSTRUMENTS) ×8 IMPLANT
DRIVER NDLE MEGA DVNC XI (INSTRUMENTS) ×6 IMPLANT
ELECTRODE REM PT RTRN 9FT ADLT (ELECTROSURGICAL) ×4 IMPLANT
FORCEPS PROGRASP DVNC XI (FORCEP) ×4 IMPLANT
GAUZE 4X4 16PLY ~~LOC~~+RFID DBL (SPONGE) ×8 IMPLANT
GLOVE BIO SURGEON STRL SZ 6.5 (GLOVE) ×12 IMPLANT
GLOVE BIO SURGEON STRL SZ7 (GLOVE) IMPLANT
GLOVE BIOGEL PI IND STRL 7.0 (GLOVE) ×24 IMPLANT
GOWN STRL REUS W/ TWL LRG LVL3 (GOWN DISPOSABLE) ×12 IMPLANT
GOWN STRL REUS W/TWL LRG LVL3 (GOWN DISPOSABLE) ×8 IMPLANT
KIT PINK PAD W/HEAD ARM REST (MISCELLANEOUS) ×4 IMPLANT
KIT TURNOVER CYSTO (KITS) ×4 IMPLANT
MANIFOLD NEPTUNE II (INSTRUMENTS) ×4 IMPLANT
NDL HYPO 25X1 1.5 SAFETY (NEEDLE) ×4 IMPLANT
NDL INSUFFLATION 14GA 120MM (NEEDLE) ×4 IMPLANT
NEEDLE HYPO 25X1 1.5 SAFETY (NEEDLE) ×3 IMPLANT
NEEDLE INSUFFLATION 14GA 120MM (NEEDLE) ×3 IMPLANT
NS IRRIG 500ML POUR BTL (IV SOLUTION) ×4 IMPLANT
OBTURATOR OPTICALSTD 8 DVNC (TROCAR) ×4 IMPLANT
PACK PERI GYN (CUSTOM PROCEDURE TRAY) ×4 IMPLANT
POSITIONER HEAD 8X9X4 ADT (SOFTGOODS) ×4 IMPLANT
POWDER SURGICEL 3.0 GRAM (HEMOSTASIS) ×4 IMPLANT
RUMI II GYRUS 3.5CM BLUE (DISPOSABLE) IMPLANT
SEAL UNIV 5-12 XI (MISCELLANEOUS) ×12 IMPLANT
SEALER VESSEL EXT DVNC XI (MISCELLANEOUS) ×4 IMPLANT
SET BASIN LINEN APH (SET/KITS/TRAYS/PACK) ×4 IMPLANT
SET IRRIG Y TYPE TUR BLADDER L (SET/KITS/TRAYS/PACK) IMPLANT
SET TRI-LUMEN FLTR TB AIRSEAL (TUBING) ×4 IMPLANT
SET TUBE IRRIG SUCTION NO TIP (IRRIGATION / IRRIGATOR) IMPLANT
SOL .9 NS 3000ML IRR UROMATIC (IV SOLUTION) IMPLANT
SPONGE T-LAP 18X18 ~~LOC~~+RFID (SPONGE) IMPLANT
SUT MNCRL AB 4-0 PS2 18 (SUTURE) ×4 IMPLANT
SUT STRATA PDS 0 30 CT-2.5 (SUTURE) ×4 IMPLANT
SUT STRATAFIX SPIRAL PDS+ 0 30 (SUTURE) ×4 IMPLANT
SUT VIC AB 0 CT1 27XBRD ANBCTR (SUTURE) IMPLANT
SYR 10ML LL (SYRINGE) ×8 IMPLANT
SYR 20ML LL LF (SYRINGE) ×4 IMPLANT
SYR 50ML LL SCALE MARK (SYRINGE) ×4 IMPLANT
SYR CONTROL 10ML LL (SYRINGE) ×8 IMPLANT
SYR TOOMEY 50ML (SYRINGE) IMPLANT
TIP ENDOSCOPIC SURGICEL (TIP) ×4 IMPLANT
TIP UTERINE 6.7X6CM WHT DISP (MISCELLANEOUS) IMPLANT
TIP UTERINE 6.7X8CM BLUE DISP (MISCELLANEOUS) IMPLANT
TROCAR PORT AIRSEAL 8X120 (TROCAR) ×4 IMPLANT
WATER STERILE IRR 500ML POUR (IV SOLUTION) ×4 IMPLANT

## 2024-04-08 NOTE — H&P (Signed)
 [facility] Attending History and Physical  Allison Garcia is a 33 y.o. G2P2002 at Unknown who presents for scheduled Robotic-assisted laparoscopic hysterectomy, bilateral salpingectomy, possible cystoscopy  In review, menses are very heavy.  Sometimes menses 10-14 days- soaking through period panties+pad+tampon in under 30-1hr for 2-3 days then will slow down for a few days then may pick back up before it stops.  Menses also irregular- will stop for a few days at most a month before it restarts.  +dysmenorrhea  Tried conservative measures and wishes to proceed with permanent surgical intervention.  Denies any abnormal vaginal discharge, fevers, chills, sweats, dysuria, nausea, vomiting, other GI or GU symptoms or other general symptoms.  Past Medical History:  Diagnosis Date   Abnormal Pap smear of cervix    Back pain    GERD (gastroesophageal reflux disease) 03/22/2020   Gestational diabetes    History of stomach ulcers    told by ED. no EGD   Preeclampsia    Pregnancy induced hypertension    Past Surgical History:  Procedure Laterality Date   CESAREAN SECTION     CESAREAN SECTION WITH BILATERAL TUBAL LIGATION N/A 05/21/2023   Procedure: CESAREAN SECTION WITH BILATERAL TUBAL LIGATION;  Surgeon: Lola Donnice HERO, MD;  Location: [facility] LD ORS;  Service: Obstetrics;  Laterality: N/A;   ESOPHAGOGASTRODUODENOSCOPY (EGD) WITH PROPOFOL  N/A 02/05/2020   Procedure: ESOPHAGOGASTRODUODENOSCOPY (EGD) WITH PROPOFOL ;  Surgeon: Shaaron Lamar HERO, MD;  Erosive reflux esophagitis, normal examined stomach, normal examined duodenum.   TEAR DUCT PROBING     as baby   OB History  Gravida Para Term Preterm AB Living  2 2 2   2   SAB IAB Ectopic Multiple Live Births     0 2    # Outcome Date GA Lbr Len/2nd Weight Sex Type Anes PTL Lv  2 Term 05/21/23 [redacted]w[redacted]d  3590 g F CS-Vac Spinal  LIV  1 Term 05/23/18 [redacted]w[redacted]d  3147 g F CS-LTranv EPI N LIV     Complications: Failure to Progress  in First Stage, Pre-eclampsia, Gestational diabetes  Patient denies any other pertinent gynecologic issues.  No current facility-administered medications on file prior to encounter.   Current Outpatient Medications on File Prior to Encounter  Medication Sig Dispense Refill   acetaminophen  (TYLENOL ) 500 MG tablet Take 1,000 mg by mouth every 6 (six) hours as needed (pain.).     ibuprofen  (ADVIL ) 200 MG tablet Take 800 mg by mouth every 8 (eight) hours as needed (pain.).     Omeprazole 20 MG TBEC Take 20 mg by mouth daily before breakfast.     albuterol  (VENTOLIN  HFA) 108 (90 Base) MCG/ACT inhaler Inhale 1-2 puffs into the lungs every 6 (six) hours as needed for wheezing or shortness of breath.     [DISCONTINUED] levonorgestrel-ethinyl estradiol (SEASONALE,INTROVALE,JOLESSA) 0.15-0.03 MG tablet Take 1 tablet by mouth at bedtime.     Allergies  Allergen Reactions   Other Anaphylaxis and Shortness Of Breath    Bleach      Social History:   reports that she has quit smoking. Her smoking use included cigarettes. She has a 9 pack-year smoking history. She has never used smokeless tobacco. She reports that she does not currently use alcohol. She reports that she does not use drugs. Family History  Problem Relation Age of Onset   Hypertension Maternal Grandmother    Gallbladder disease Mother    Torticollis Daughter    COPD Other    Colon cancer Neg Hx  Colon polyps Neg Hx     Review of Systems: Pertinent items noted in HPI and remainder of comprehensive ROS otherwise negative.  PHYSICAL EXAM: Blood pressure (!) 148/92, pulse 86, temperature 98.3 F (36.8 C), temperature source Oral, resp. rate 18, height 5' 1 (1.549 m), weight 111.2 kg, last menstrual period 03/26/2024, SpO2 98%, not currently breastfeeding. CONSTITUTIONAL: Well-developed, well-nourished female in no acute distress.  SKIN: Skin is warm and dry. No rash noted. Not diaphoretic. No erythema. No pallor. NEUROLOGIC: Alert  and oriented to person, place, and time. Normal reflexes, muscle tone coordination. No cranial nerve deficit noted. PSYCHIATRIC: Normal mood and affect. Normal behavior. Normal judgment and thought content. CARDIOVASCULAR: Normal heart rate noted, regular rhythm RESPIRATORY: Effort and breath sounds normal, no problems with respiration noted ABDOMEN: obese, Soft, nontender, nondistended. PELVIC: deferred MUSCULOSKELETAL: no calf tenderness bilaterally EXT: no edema bilaterally, normal pulses  Labs: Results for orders placed or performed during the hospital encounter of 04/04/24 (from the past 2 weeks)  CBC   Collection Time: 04/04/24  9:24 AM  Result Value Ref Range   WBC 8.7 4.0 - 10.5 K/uL   RBC 5.00 3.87 - 5.11 MIL/uL   Hemoglobin 14.7 12.0 - 15.0 g/dL   HCT 56.2 63.9 - 53.9 %   MCV 87.4 80.0 - 100.0 fL   MCH 29.4 26.0 - 34.0 pg   MCHC 33.6 30.0 - 36.0 g/dL   RDW 86.7 88.4 - 84.4 %   Platelets 253 150 - 400 K/uL   nRBC 0.0 0.0 - 0.2 %  Basic metabolic panel   Collection Time: 04/04/24  9:24 AM  Result Value Ref Range   Sodium 139 135 - 145 mmol/L   Potassium 3.9 3.5 - 5.1 mmol/L   Chloride 107 98 - 111 mmol/L   CO2 20 (L) 22 - 32 mmol/L   Glucose, Bld 100 (H) 70 - 99 mg/dL   BUN 15 6 - 20 mg/dL   Creatinine, Ser 9.39 0.44 - 1.00 mg/dL   Calcium 8.8 (L) 8.9 - 10.3 mg/dL   GFR, Estimated >39 >39 mL/min   Anion gap 12 5 - 15  Type and screen [facility]   Collection Time: 04/04/24  9:24 AM  Result Value Ref Range   ABO/RH(D) A POS    Antibody Screen NEG    Sample Expiration 04/18/2024,2359    Extend sample reason      NO TRANSFUSIONS OR PREGNANCY IN THE PAST 3 MONTHS Performed at [facility], [address] [place], [place] [address]   Pregnancy, urine   Collection Time: 04/04/24  9:25 AM  Result Value Ref Range   Preg Test, Ur NEGATIVE NEGATIVE    Imaging Studies: Pelvic US : 02-2024: 5.2 x 4.8 x 5.7 cm uterus.  Volume 75 cc.  No abnormalities noted.   Normal symmetrical endometrium no abnormalities.  Normal ovaries bilaterally   Assessment: Abnormal uterine bleeding   Plan: Robotic-assisted laparoscopic hysterectomy, bilateral salpingectomy, possible cystoscopy -Ancef  2g IV -NPO -LR @ 125cc/hr -Toradol  IV -SCDs to OR -Risk/benefits and alternatives reviewed with the patient including but not limited to risk of bleeding, infection and injury to surrounding organs requiring further surgical intervention.  Risk/benefits previously reviewed with patient in office.  Questions and concerns were addressed and pt desires to proceed  Bexley Laubach, DO Attending Obstetrician & Gynecologist, [facility] for [facility], [facility]

## 2024-04-08 NOTE — OR Nursing (Signed)
 Tried to call and update mother, no answer.

## 2024-04-08 NOTE — Anesthesia Postprocedure Evaluation (Signed)
 Anesthesia Post Note  Patient: Allison Garcia  Procedure(s) Performed: HYSTERECTOMY, TOTAL, LAPAROSCOPIC, ROBOT-ASSISTED (Abdomen) LYSIS, ADHESIONS, ROBOT-ASSISTED, LAPAROSCOPIC (Abdomen) CYSTOSCOPY (Urethra) SALPINGECTOMY, ROBOT-ASSISTED (Right: Abdomen)  Patient location during evaluation: PACU Anesthesia Type: General Level of consciousness: awake and alert Pain management: pain level controlled Vital Signs Assessment: post-procedure vital signs reviewed and stable Respiratory status: spontaneous breathing, nonlabored ventilation, respiratory function stable and patient connected to nasal cannula oxygen Cardiovascular status: blood pressure returned to baseline and stable Postop Assessment: no apparent nausea or vomiting Anesthetic complications: no   No notable events documented.   Last Vitals:  Vitals:   04/08/24 1230 04/08/24 1245  BP: 114/74 106/80  Pulse: 89 90  Resp: 17 15  Temp:    SpO2: 95% 95%    Last Pain:  Vitals:   04/08/24 1213  TempSrc:   PainSc: Asleep                 Andrea Limes

## 2024-04-08 NOTE — Transfer of Care (Signed)
 Immediate Anesthesia Transfer of Care Note  Patient: SHRIKA MILOS  Procedure(s) Performed: HYSTERECTOMY, TOTAL, LAPAROSCOPIC, ROBOT-ASSISTED (Abdomen) LYSIS, ADHESIONS, ROBOT-ASSISTED, LAPAROSCOPIC (Abdomen) CYSTOSCOPY (Urethra) SALPINGECTOMY, ROBOT-ASSISTED (Right: Abdomen)  Patient Location: PACU  Anesthesia Type:General  Level of Consciousness: awake, oriented, drowsy, and patient cooperative  Airway & Oxygen Therapy: Patient Spontanous Breathing and Patient connected to face mask oxygen  Post-op Assessment: Report given to RN, Post -op Vital signs reviewed and stable, and Patient moving all extremities X 4  Post vital signs: Reviewed and stable  Last Vitals:  Vitals Value Taken Time  BP 123/73 04/08/24 12:13  Temp 98.6 1214  Pulse 101 04/08/24 12:14  Resp 16 1214  SpO2 95 % 04/08/24 12:14  Vitals shown include unfiled device data.  Last Pain:  Vitals:   04/08/24 0644  TempSrc: Oral  PainSc: 0-No pain      Patients Stated Pain Goal: 6 (04/08/24 9355)  Complications: No notable events documented.

## 2024-04-08 NOTE — Anesthesia Preprocedure Evaluation (Addendum)
 Anesthesia Evaluation  Patient identified by MRN, date of birth, ID band Patient awake    Reviewed: Allergy & Precautions, H&P , NPO status , Patient's Chart, lab work & pertinent test results  Airway Mallampati: III  TM Distance: >3 FB Neck ROM: Full    Dental no notable dental hx.    Pulmonary Patient abstained from smoking., former smoker   Pulmonary exam normal breath sounds clear to auscultation       Cardiovascular hypertension, Normal cardiovascular exam Rhythm:Regular Rate:Normal     Neuro/Psych negative neurological ROS  negative psych ROS   GI/Hepatic Neg liver ROS,GERD  ,,  Endo/Other  diabetes, Gestational  Class 3 obesity  Renal/GU   negative genitourinary   Musculoskeletal negative musculoskeletal ROS (+)    Abdominal   Peds negative pediatric ROS (+)  Hematology negative hematology ROS (+)   Anesthesia Other Findings   Reproductive/Obstetrics negative OB ROS                              Anesthesia Physical Anesthesia Plan  ASA: 2  Anesthesia Plan: General   Post-op Pain Management:    Induction: Intravenous and Cricoid pressure planned  PONV Risk Score and Plan:   Airway Management Planned: Oral ETT  Additional Equipment:   Intra-op Plan:   Post-operative Plan: Extubation in OR  Informed Consent: I have reviewed the patients History and Physical, chart, labs and discussed the procedure including the risks, benefits and alternatives for the proposed anesthesia with the patient or authorized representative who has indicated his/her understanding and acceptance.     Dental advisory given  Plan Discussed with: CRNA  Anesthesia Plan Comments:          Anesthesia Quick Evaluation

## 2024-04-08 NOTE — Op Note (Addendum)
 Pre Op Dx:   Abnormal uterine bleeding, dysmenorrhea Post Op Dx:   Same and abdominal adhesions  Procedure:   Robotic Assisted Total Laparoscopic Hysterectomy, right salpingectomy, lysis of adhesions, cystoscopy   Surgeon:  Dr. Delon Prude Assistants:  none Anesthesia:  general   EBL:  100cc  IVF:  1500cc UOP:  500cc   Drains:  Foley catheter Specimen removed: Uterus with cervix, right fallopian tube Findings: Dense adhesions of the omentum to the anterior abdominal wall as well as to the anterior portion of the uterus.  Dense adhesions noted between the bladder and lower uterine segment.  Normal left ovary.  Normal right ovary with small section of right fallopian tube  Complications: None  Description of procedure:  After informed consent the patient was taken to the operating room and placed in dorsal supine position where general endotracheal anesthesia was administered and found to be adequate.  She was placed in dorsal lithotomy position with her arms tucked.  She was prepped and draped in the usual sterile fashion.  A timeout was called and the procedure confirmed.  A RUMI uterine manipulator with the Koh cup and a Foley catheter were placed.   An incision was made in the supraumbilical area and the Veress needle was inserted into the abdominal cavity without difficulty. Proper placement was confirmed using the saline drop test and opening pressure was 8 mmHg. A pneumoperitoneum was obtained. The laparoscopic trocar and the laparoscope were placed under direct visualization.  Three additional 7mm ports were placed on either side of the umbilicus and an 8mm port was placed in the left upper quadrant under direct visualization.  The patient was placed in Trendelenburg position and the [employer] robotic device was docked.  Next, attention was turned to the console.  Due to the moderate adhesions using the vessel sealer the omental adhesions were dissected off the anterior wall as well  as the fundal portion of the uterus.   Over 45 minutes were spent lysing the above adhesions. The uterus could then be better visualized; however dense adhesions were still noted between the uterus and bladder.  The right uterine ovarian ligament was divided with the vessel sealer. The right round ligament was divided.  A small avascular window was noted in the posterior broad ligament and taken down to the level of the vaginal cup.  The right uterine artery and vein were skeletonized and desiccated superior to the cup using the vessel sealer.  Using this avascular window the cup was traced around anteriorly; however due to the density of the bladder adhesions a good plane cannot be developed.   Attention was then turned to the patient's left side.  In a similar fashion the left uterine ovarian ligament was ligated with the vessel sealer.  Due to the adhesions there was difficulty identifying the left round ligament.    Attention was then turned back to the posterior portion of the uterus.  A small posterior colpotomy incision was made to mark the Koh cup. The uterine artery and vein were skeletonized and desiccated superior to the Koh cup.  The bladder was then filled for better visualization of the vesicouterine plane.  The vessel sealer and hook were then used to resect the bladder off the lower uterine segment down to the level of the cervix.  The Koh cup was then identified.  An anterior colpotomy incision was then made using the hook.  The colpotomy was completed along the ridge of the Koh cup.  On  the left side with ligation of the cervix a small pocket of pus was noted and exposed within the abdomen.  Once the colpotomy was completed, the uterus was passed off the field. The vaginal occluder was placed in the vagina to maintain pneumoperitoneum. Irrigation was completed.  The vaginal cuff was then closed with 0- Stratafix suture in a double layer fashion. Ureters were not visualized well due to the  adhesions.   The abdomen was inspected and the fallopian tube remnant was noted and excised with the vessel sealer.  It was removed and sent to pathology with the other specimen. Hemostasis confirmed. Arista powder was placed on the vaginal cuff and adnexa bilaterally.    Cystoscopy was then completed.  Cystoscopy was performed and demonstrated intact urothelium throughout the bladder, a normal-appearing trigone and bilaterally patent ureteral orifices with normal urine jets noted.   The [employer] robotic device was undocked.  Under direct visualization TAP block was completed under direct visualization using 10cc of 0.25% marcaine  in each of four locations.  Airseal was deflated and trocars were removed.The skin was closed with 4-0 Vicryl in subcuticular fashion with skin glue placed atop each port site.    The patient was returned to dorsal supine position, awakened and extubated in the OR having appeared to tolerate the procedure well.  All sponge, needle, and instrument counts were correct x 2 at the end of the case.  Pt tolerated procedure well and was taken to recovery in stable condition.  Pt will be given 7 day course of antibiotic to help decrease risk of vaginal cuff infection.   Yosgar Demirjian, DO Attending Obstetrician & Gynecologist, [facility] for [employer], [facility]

## 2024-04-08 NOTE — Anesthesia Procedure Notes (Signed)
 Procedure Name: Intubation Date/Time: 04/08/2024 7:44 AM  Performed by: Cordella Elvie HERO, CRNAPre-anesthesia Checklist: Patient identified, Emergency Drugs available, Suction available, Patient being monitored and Timeout performed Patient Re-evaluated:Patient Re-evaluated prior to induction Oxygen Delivery Method: Circle system utilized Preoxygenation: Pre-oxygenation with 100% oxygen Induction Type: IV induction Ventilation: Mask ventilation without difficulty Laryngoscope Size: Mac and 3 Grade View: Grade II Tube type: Oral Tube size: 7.0 mm Number of attempts: 1 Airway Equipment and Method: Stylet Placement Confirmation: ETT inserted through vocal cords under direct vision, positive ETCO2, CO2 detector and breath sounds checked- equal and bilateral Secured at: 22 cm Tube secured with: Tape Dental Injury: Teeth and Oropharynx as per pre-operative assessment

## 2024-04-08 NOTE — Discharge Instructions (Signed)
 Post Operative Pain Med Plan:  >Take gabapentin  300 mg three times per day, as prescribed for 4 days, try to space them evenly  >Take Ibuprofen  600mg  every 6 hours as needed.    >In between the Ibuprofen , take Tylenol  (over the counter) every 6 to 8 hours.  If the Tylenol  does not seem strong enough.  Take the tylenol  along with the oxycodone  every 6 hours If the oxycodone  seems to strong then just take Tylenol   >Oxycodone  will cause constipation, please be sure to take a stool softener (Colace) twice daily while taking this pain medication and/or continue this medication until your bowel regimen returns to normal  If possible try to take the Toradol  or Ibuprofen  with food to help avoid upsetting your stomach  >Use a heating pad as well as needed  >I have also sent a prescription for zofran  (ondansetron ) for nausea to take if needed over the first couple of days  >Be gentle with your diet the first few days, liquids and soft non spicy food, fruits are great  >Get up and move, no lifting or straining  HOME INSTRUCTIONS  Please note any unusual or excessive bleeding, pain, swelling. Mild dizziness or drowsiness are normal for about 24 hours after surgery.   Shower when comfortable  Restrictions: No driving for 24 hours or while taking pain medications.  Activity:  No heavy lifting (> 10 lbs), nothing in vagina (no tampons, douching, or intercourse) x 8 weeks; no tub baths for 8 weeks Vaginal spotting is expected but if your bleeding is heavy, period like,  please call the office   Incision: the bandaids will fall off when they are ready to; you may clean your incision with mild soap and water  but do not rub or scrub the incision site.  You may experience slight bloody drainage from your incision periodically.  This is normal.  If you experience a large amount of drainage or the incision opens, please call your physician who will likely direct you to the emergency department.  Diet:   You may return to your regular diet.  Do not eat large meals.  Eat small frequent meals throughout the day.  Continue to drink a good amount of water  at least 6-8 glasses of water  per day, hydration is very important for the healing process.  Pain Management: See above pain instructions  Always take prescription pain medication with food.  Oxycodne may cause constipation, you may want to take a stool softener while taking this medication.  A prescription of colace has been sent in to take twice daily if needed while taking the oxycodone .  Be sure to drink plenty of fluids and increase your fiber to help with constipation.  Alcohol -- Avoid for 24 hours and while taking pain medications.  Nausea: Take sips of ginger ale or soda.  Also a prescription for zofran  has been sent in if needed.  Fever -- Call physician if temperature over 101 degrees  Follow up:  If you do not already have a follow up appointment scheduled, please call the office at [phone].  If you experience fever (a temperature greater than 100.4), pain unrelieved by pain medication, shortness of breath, swelling of a single leg, or any other symptoms which are concerning to you please the office immediately.

## 2024-04-09 ENCOUNTER — Telehealth: Payer: Self-pay | Admitting: Obstetrics & Gynecology

## 2024-04-09 ENCOUNTER — Encounter (HOSPITAL_COMMUNITY): Payer: Self-pay | Admitting: Obstetrics & Gynecology

## 2024-04-09 NOTE — Telephone Encounter (Signed)
-   Called patient states her pain is pretty well-controlled as long as she stays on top of the medicine - Already passing gas and voiding without difficulty - Questions and concerns were addressed - Plan to follow-up as scheduled

## 2024-04-10 LAB — SURGICAL PATHOLOGY

## 2024-04-12 ENCOUNTER — Ambulatory Visit: Payer: Self-pay | Admitting: Obstetrics & Gynecology

## 2024-04-14 ENCOUNTER — Ambulatory Visit: Admitting: Obstetrics & Gynecology

## 2024-04-14 ENCOUNTER — Encounter: Payer: Self-pay | Admitting: Obstetrics & Gynecology

## 2024-04-14 VITALS — BP 149/91 | Ht 61.0 in | Wt 241.0 lb

## 2024-04-14 DIAGNOSIS — Z48816 Encounter for surgical aftercare following surgery on the genitourinary system: Secondary | ICD-10-CM

## 2024-04-14 DIAGNOSIS — Z4889 Encounter for other specified surgical aftercare: Secondary | ICD-10-CM

## 2024-04-14 NOTE — Progress Notes (Signed)
    PostOp Visit Note  Allison Garcia is a 33 y.o. G9P2002 female who presents for a postoperative visit. She is 1 week postop following a RAH, BS, lysis of adhesions completed on 8/5   Today she notes she is doing ok- notes some soreness.  Mostly just taking the oxycodone  at night.  Taking ibuprofen  and Tylenol  as needed and this is working well for her pain Denies fever or chills.  Tolerating gen diet.  +Flatus, Regular BMs.  Denies vaginal bleeding Overall doing well and reports no acute complaints   Review of Systems Pertinent items are noted in HPI.    Objective:  BP (!) 149/91   Ht 5' 1 (1.549 m)   Wt 241 lb (109.3 kg)   LMP 03/26/2024 (Approximate)   BMI 45.54 kg/m    Physical Examination:  GENERAL ASSESSMENT: well developed and well nourished SKIN: normal color, no lesions CHEST: normal air exchange, respiratory effort normal with no retractions HEART: regular rate and rhythm ABDOMEN: obese, soft, non-distended, + BS, appropriately tender INCISION: C/D/I with dermabond, healing appropriately EXTREMITY: no edema, no calf tenderness bilaterally PSYCH: mood appropriate, normal affect       Assessment:    Postop visit   Plan:   -meeting milestones appropriately -reviewed pelvic rest -f/u in 7wks for postop visit  Zorawar Strollo, DO Attending Obstetrician & Gynecologist, [facility] [employer], [facility]

## 2024-04-23 ENCOUNTER — Encounter: Payer: Self-pay | Admitting: Obstetrics & Gynecology

## 2024-05-19 ENCOUNTER — Ambulatory Visit (INDEPENDENT_AMBULATORY_CARE_PROVIDER_SITE_OTHER): Admitting: Obstetrics & Gynecology

## 2024-05-19 ENCOUNTER — Encounter: Payer: Self-pay | Admitting: Obstetrics & Gynecology

## 2024-05-19 ENCOUNTER — Other Ambulatory Visit (HOSPITAL_COMMUNITY)
Admission: RE | Admit: 2024-05-19 | Discharge: 2024-05-19 | Disposition: A | Discharge status: Home / Self Care | Source: Ambulatory Visit | Attending: Obstetrics & Gynecology | Admitting: Obstetrics & Gynecology

## 2024-05-19 VITALS — BP 148/92 | HR 85 | Ht 61.0 in | Wt 245.8 lb

## 2024-05-19 DIAGNOSIS — N760 Acute vaginitis: Secondary | ICD-10-CM | POA: Insufficient documentation

## 2024-05-19 DIAGNOSIS — R030 Elevated blood-pressure reading, without diagnosis of hypertension: Secondary | ICD-10-CM

## 2024-05-19 DIAGNOSIS — Z48816 Encounter for surgical aftercare following surgery on the genitourinary system: Secondary | ICD-10-CM

## 2024-05-19 DIAGNOSIS — Z4889 Encounter for other specified surgical aftercare: Secondary | ICD-10-CM

## 2024-05-19 DIAGNOSIS — N898 Other specified noninflammatory disorders of vagina: Secondary | ICD-10-CM | POA: Diagnosis present

## 2024-05-19 NOTE — Progress Notes (Signed)
    PostOp Visit Note  Allison Garcia is a 33 y.o. G110P2002 female who presents for a postoperative visit. She is 6 weeks postop following a RAH, RS, lysis of adhesions completed on 8/5.  Patient moved her appointment up early as she is feeling great and reports no acute complaints.  She is feeling ready to return to work  Denies fever or chills.  Tolerating gen diet.  +Flatus, Regular BMs.  She is not taking any pain medication.  She denies any vaginal bleeding. Overall doing well and reports no acute complaints   Review of Systems Pertinent items are noted in HPI.    Objective:  BP (!) 148/92 (BP Location: Left Arm, Patient Position: Sitting, Cuff Size: Normal)   Pulse 85   Ht 5' 1 (1.549 m)   Wt 245 lb 12.8 oz (111.5 kg)   LMP 03/26/2024 (Approximate)   Breastfeeding No   BMI 46.44 kg/m    Physical Examination:  GENERAL ASSESSMENT: well developed and well nourished SKIN: normal color, no lesions CHEST: normal air exchange, respiratory effort normal with no retractions HEART: regular rate and rhythm ABDOMEN: soft, non-distended, no rebound or guarding.   GU: Normal external genitalia, frothy white discharge noted difficulty visualizing vaginal cuff no abnormalities noted on speculum exam.  On bimanual exam vaginal cuff intact, no abnormalities appreciated EXTREMITY: no edema, no calf tenderness bilaterally PSYCH: mood appropriate, normal affect       Assessment:    Postop visit  Elevated blood pressure  Plan:   - Patient may return to work - May return to regular activity, okay to return to intercourse but encouraged the patient to get slow - Pap no longer indicated - Elevated BP, patient has follow-up with PCP in November  Roneisha Stern, DO Attending Obstetrician & Gynecologist, [facility] for [facility], [facility]

## 2024-05-19 NOTE — Addendum Note (Signed)
 Addended by: ILEAN RUTHERFORD HERO on: 05/19/2024 04:23 PM   Modules accepted: Orders

## 2024-05-21 ENCOUNTER — Ambulatory Visit: Payer: Self-pay | Admitting: Obstetrics & Gynecology

## 2024-05-21 DIAGNOSIS — B9689 Other specified bacterial agents as the cause of diseases classified elsewhere: Secondary | ICD-10-CM

## 2024-05-21 LAB — CERVICOVAGINAL ANCILLARY ONLY
Bacterial Vaginitis (gardnerella): POSITIVE — AB
Candida Glabrata: NEGATIVE
Candida Vaginitis: NEGATIVE
Comment: NEGATIVE
Comment: NEGATIVE
Comment: NEGATIVE

## 2024-05-21 MED ORDER — METRONIDAZOLE 0.75 % VA GEL: 1.0000 | Freq: Every day | VAGINAL | 3 refills | Status: AC

## 2024-05-30 ENCOUNTER — Encounter: Admitting: Obstetrics & Gynecology

## 2024-06-03 ENCOUNTER — Other Ambulatory Visit: Payer: Self-pay | Admitting: Obstetrics & Gynecology

## 2024-06-03 DIAGNOSIS — B9689 Other specified bacterial agents as the cause of diseases classified elsewhere: Secondary | ICD-10-CM

## 2024-06-03 MED ORDER — METRONIDAZOLE 500 MG PO TABS: 500.0000 mg | ORAL_TABLET | Freq: Two times a day (BID) | ORAL | 0 refills | Status: AC

## 2024-06-03 NOTE — Progress Notes (Signed)
Rx sent in for [place], DO Attending [place], [facility] for [facility], [place]

## 2024-07-16 ENCOUNTER — Ambulatory Visit: Payer: Self-pay

## 2024-07-16 VITALS — BP 160/100 | HR 77 | Ht 61.0 in | Wt 243.0 lb

## 2024-07-16 DIAGNOSIS — G4711 Idiopathic hypersomnia with long sleep time: Secondary | ICD-10-CM | POA: Diagnosis not present

## 2024-07-16 DIAGNOSIS — I10 Essential (primary) hypertension: Secondary | ICD-10-CM | POA: Diagnosis not present

## 2024-07-16 DIAGNOSIS — Z6841 Body Mass Index (BMI) 40.0 and over, adult: Secondary | ICD-10-CM | POA: Diagnosis not present

## 2024-07-16 DIAGNOSIS — Z8742 Personal history of other diseases of the female genital tract: Secondary | ICD-10-CM

## 2024-07-16 DIAGNOSIS — F418 Other specified anxiety disorders: Secondary | ICD-10-CM | POA: Diagnosis not present

## 2024-07-16 DIAGNOSIS — O24415 Gestational diabetes mellitus in pregnancy, controlled by oral hypoglycemic drugs: Secondary | ICD-10-CM

## 2024-07-16 MED ORDER — METFORMIN HCL 500 MG PO TABS: 500.0000 mg | ORAL_TABLET | Freq: Two times a day (BID) | ORAL | 3 refills | Status: AC

## 2024-07-16 MED ORDER — VALSARTAN 40 MG PO TABS
40.0000 mg | ORAL_TABLET | Freq: Every day | ORAL | 3 refills | Status: AC
Start: 2024-07-16 — End: ?

## 2024-07-16 MED ORDER — SERTRALINE HCL 50 MG PO TABS
50.0000 mg | ORAL_TABLET | Freq: Every day | ORAL | 3 refills | Status: AC
Start: 2024-07-16 — End: ?

## 2024-07-16 NOTE — Progress Notes (Signed)
 New Patient Office Visit  Subjective    Patient ID: Allison Garcia, female    DOB: [birth year]  Age: 33 y.o. MRN: [id number]  CC:  Chief Complaint  Patient presents with   Establish Care    Establish,Weight loss medication specifically Zepbound pt also states she is really anxious all the time and her and her Childrens father also just split up and she's felt more depressed too but states more anxiety than depression     HPI Allison Garcia presents to establish care  Discussed the use of AI scribe software for clinical note transcription with the patient, who gave verbal consent to proceed.  History of Present Illness    Allison Garcia is a 33 year old female with anxiety and hypertension who presents for evaluation of weight management and anxiety.  Weight management - Desires initiation of Zepbound for weight management - Previous attempts at weight loss with diet and exercise, including low-carbohydrate diets, without success - No prior use of weight loss medications  Sleep disturbance and suspected obstructive sleep apnea - Symptoms include loud vocalizations during sleep ('hollering, waking up') and persistent fatigue despite eight hours of sleep - Sleep disturbances affect both her and her children - No prior sleep study performed  Anxiety and mood symptoms - Persistent anxiety with a constant feeling of being 'ready to snap' - History of postpartum depression following birth of her youngest child (now 39 months old) - Postpartum depression previously treated with sertraline (Zoloft), which alleviated depressive symptoms but anxiety persists  Headache and hypertension - History of hypertension, previously managed with labetalol  during pregnancy due to preeclampsia - Currently not on antihypertensive medication - Recent onset of headaches, attributed to elevated blood pressure  Polycystic ovary syndrome and gynecologic history - History of polycystic ovary syndrome  (PCOS) - Previously treated with metformin  during pregnancy, which improved PCOS symptoms - History of persistent abnormal uterine bleeding, resulting in hysterectomy with ovarian conservation     Outpatient Encounter Medications as of 07/16/2024  Medication Sig   acetaminophen  (TYLENOL ) 500 MG tablet Take 1,000 mg by mouth every 6 (six) hours as needed (pain.).   albuterol  (VENTOLIN  HFA) 108 (90 Base) MCG/ACT inhaler Inhale 1-2 puffs into the lungs every 6 (six) hours as needed for wheezing or shortness of breath.   ibuprofen  (ADVIL ) 600 MG tablet Take 1 tablet (600 mg total) by mouth every 6 (six) hours as needed.   metFORMIN  (GLUCOPHAGE ) 500 MG tablet Take 1 tablet (500 mg total) by mouth 2 (two) times daily with a meal.   Omeprazole 20 MG TBEC Take 20 mg by mouth daily before breakfast.   ondansetron  (ZOFRAN -ODT) 4 MG disintegrating tablet Take 1 tablet (4 mg total) by mouth every 8 (eight) hours as needed.   sertraline (ZOLOFT) 50 MG tablet Take 1 tablet (50 mg total) by mouth daily.   valsartan (DIOVAN) 40 MG tablet Take 1 tablet (40 mg total) by mouth daily.   [DISCONTINUED] levonorgestrel-ethinyl estradiol (SEASONALE,INTROVALE,JOLESSA) 0.15-0.03 MG tablet Take 1 tablet by mouth at bedtime.   No facility-administered encounter medications on file as of 07/16/2024.    Past Medical History:  Diagnosis Date   Abnormal Pap smear of cervix    Back pain    GERD (gastroesophageal reflux disease) 03/22/2020   Gestational diabetes    History of stomach ulcers    told by ED. no EGD   Preeclampsia    Pregnancy induced hypertension     Past Surgical History:  Procedure Laterality Date   CESAREAN SECTION     CESAREAN SECTION WITH BILATERAL TUBAL LIGATION N/A 05/21/2023   Procedure: CESAREAN SECTION WITH BILATERAL TUBAL LIGATION;  Surgeon: Lola Donnice HERO, Garcia;  Location: [facility];  Service: Obstetrics;  Laterality: N/A;   CYSTOSCOPY N/A 04/08/2024   Procedure: CYSTOSCOPY;  Surgeon:  Allison Garcia;  Location: [facility];  Service: Gynecology;  Laterality: N/A;   ESOPHAGOGASTRODUODENOSCOPY (EGD) WITH PROPOFOL  N/A 02/05/2020   Procedure: ESOPHAGOGASTRODUODENOSCOPY (EGD) WITH PROPOFOL ;  Surgeon: Allison Garcia;  Erosive reflux esophagitis, normal examined stomach, normal examined duodenum.   HYSTERECTOMY, TOTAL, LAPAROSCOPIC, ROBOT-ASSISTED WITH SALPINGECTOMY N/A 04/08/2024   Procedure: HYSTERECTOMY, TOTAL, LAPAROSCOPIC, ROBOT-ASSISTED;  Surgeon: Allison Garcia;  Location: [facility];  Service: Gynecology;  Laterality: N/A;   ROBOTIC ASSISTED LAPAROSCOPIC LYSIS OF ADHESION N/A 04/08/2024   Procedure: LYSIS, ADHESIONS, ROBOT-ASSISTED, LAPAROSCOPIC;  Surgeon: Allison Garcia;  Location: [facility];  Service: Gynecology;  Laterality: N/A;   TEAR DUCT PROBING     as baby   XI ROBOTIC ASSISTED SALPINGECTOMY Right 04/08/2024   Procedure: SALPINGECTOMY, ROBOT-ASSISTED;  Surgeon: Allison Garcia;  Location: [facility];  Service: Gynecology;  Laterality: Right;    Family History  Problem Relation Age of Onset   Hypertension Maternal Grandmother    Gallbladder disease Mother    Torticollis Daughter    COPD Other    Colon cancer Neg Hx    Colon polyps Neg Hx     Social History   Socioeconomic History   Marital status: Single    Spouse name: Not on file   Number of children: Not on file   Years of education: Not on file   Highest education level: Not on file  Occupational History   Occupation: Nurse Tech    Comment: [employer]  Tobacco Use   Smoking status: Former    Current packs/day: 1.00    Average packs/day: 1 pack/day for 9.0 years (9.0 ttl pk-yrs)    Types: Cigarettes   Smokeless tobacco: Never  Vaping Use   Vaping status: Former  Substance and Sexual Activity   Alcohol use: Not Currently    Comment: occasional   Drug use: No   Sexual activity: Yes    Birth control/protection: Surgical    Comment: tubal  Other Topics Concern   Not on file  Social History  Narrative   Not on file   Social Drivers of Health   Financial Resource Strain: Low Risk  (12/04/2022)   Overall Financial Resource Strain (CARDIA)    Difficulty of Paying Living Expenses: Not very hard  Food Insecurity: No Food Insecurity (03/07/2024)   Received from [facility]   Hunger Vital Sign    Within the past 12 months, you worried that your food would run out before you got the money to buy more.: Never true    Within the past 12 months, the food you bought just didn't last and you didn't have money to get more.: Never true  Transportation Needs: No Transportation Needs (03/07/2024)   Received from [facility]   PRAPARE - Transportation    Lack of Transportation (Medical): No    Lack of Transportation (Non-Medical): No  Physical Activity: Inactive (12/04/2022)   Exercise Vital Sign    Days of Exercise per Week: 5 days    Minutes of Exercise per Session: 0 min  Stress: Stress Concern Present (12/04/2022)   [employer] of Occupational Health - Occupational Stress Questionnaire  Feeling of Stress : Very much  Social Connections: Moderately Isolated (12/04/2022)   Social Connection and Isolation Panel    Frequency of Communication with Friends and Family: Twice a week    Frequency of Social Gatherings with Friends and Family: Once a week    Attends Religious Services: 1 to 4 times per year    Active Member of [employer] or Organizations: No    Attends Banker Meetings: Never    Marital Status: Divorced  Catering Manager Violence: Not At Risk (05/21/2023)   Humiliation, Afraid, Rape, and Kick questionnaire    Fear of Current or Ex-Partner: No    Emotionally Abused: No    Physically Abused: No    Sexually Abused: No    ROS      Objective    BP (!) 160/100   Pulse 77   Ht 5' 1 (1.549 m)   Wt 243 lb (110.2 kg)   LMP 03/26/2024 (Approximate)   SpO2 97%   BMI 45.91 kg/m   Physical Exam Vitals and nursing note reviewed.  Constitutional:       Appearance: Normal appearance.  HENT:     Head: Normocephalic.     Right Ear: Tympanic membrane, ear canal and external ear normal.     Left Ear: Tympanic membrane, ear canal and external ear normal.     Nose: Nose normal.     Mouth/Throat:     Mouth: Mucous membranes are moist.     Pharynx: Oropharynx is clear.  Eyes:     Extraocular Movements: Extraocular movements intact.     Pupils: Pupils are equal, round, and reactive to light.  Cardiovascular:     Rate and Rhythm: Normal rate and regular rhythm.  Pulmonary:     Effort: Pulmonary effort is normal.     Breath sounds: Normal breath sounds.  Abdominal:     General: Bowel sounds are normal.     Palpations: Abdomen is soft.  Musculoskeletal:        General: Normal range of motion.     Cervical back: Normal range of motion and neck supple.  Skin:    General: Skin is warm and dry.  Neurological:     Mental Status: She is alert and oriented to person, place, and time.  Psychiatric:        Mood and Affect: Mood normal.        Thought Content: Thought content normal.         Assessment & Plan:   Problem List Items Addressed This Visit       Cardiovascular and Mediastinum   Chronic hypertension   Elevated blood pressure with headaches. Previously on labetalol  during pregnancy. Current blood pressure remains high. - Prescribed valsartan 40 mg, one tablet once a day. Consider starting with half a tablet if blood pressure is very high.      Relevant Medications   valsartan (DIOVAN) 40 MG tablet   Other Relevant Orders   CMP14+EGFR (Completed)   CBC (Completed)     Endocrine   Gestational diabetes   Obesity with PCOS. Previous weight loss attempts unsuccessful. Metformin  considered for weight management and PCOS regulation. - Prescribed metformin , one tablet twice a day with meals.      Relevant Medications   valsartan (DIOVAN) 40 MG tablet   metFORMIN  (GLUCOPHAGE ) 500 MG tablet   Other Relevant Orders   HgB A1c  (Completed)   Insulin and C-Peptide (Completed)     Other   History of  PCOS (Chronic)   Obesity with PCOS. Previous weight loss attempts unsuccessful. Metformin  considered for weight management and PCOS regulation. - Prescribed metformin , one tablet twice a day with meals.      Relevant Medications   metFORMIN  (GLUCOPHAGE ) 500 MG tablet   Other Relevant Orders   CMP14+EGFR (Completed)   HgB A1c (Completed)   Insulin and C-Peptide (Completed)   Idiopathic hypersomnia - Primary   Symptoms suggestive of sleep apnea. Sleep study indicated for further evaluation. - Ordered sleep study to evaluate for sleep apnea.      Relevant Orders   Ambulatory referral to Pulmonology   BMI 45.0-49.9, adult (HCC)   Relevant Medications   metFORMIN  (GLUCOPHAGE ) 500 MG tablet   Other Relevant Orders   Ambulatory referral to Pulmonology   Depression with anxiety   Ongoing anxiety and depression. Previously on sertraline, which was effective. Current symptoms include feeling sick and irritability. - Prescribed sertraline 50 mg, one tablet once a day. Start with half a tablet for the first week.      Relevant Medications   sertraline (ZOLOFT) 50 MG tablet   No follow-ups on file.   Leita Longs, FNP

## 2024-07-17 LAB — CBC
Hematocrit: 47.5 % — ABNORMAL HIGH (ref 34.0–46.6)
Hemoglobin: 15.7 g/dL (ref 11.1–15.9)
MCH: 29.3 pg (ref 26.6–33.0)
MCHC: 33.1 g/dL (ref 31.5–35.7)
MCV: 89 fL (ref 79–97)
Platelets: 262 x10E3/uL (ref 150–450)
RBC: 5.35 x10E6/uL — ABNORMAL HIGH (ref 3.77–5.28)
RDW: 13.9 % (ref 11.7–15.4)
WBC: 9.8 x10E3/uL (ref 3.4–10.8)

## 2024-07-17 LAB — CMP14+EGFR
ALT: 53 IU/L — ABNORMAL HIGH (ref 0–32)
AST: 32 IU/L (ref 0–40)
Albumin: 4.6 g/dL (ref 3.9–4.9)
Alkaline Phosphatase: 58 IU/L (ref 41–116)
BUN/Creatinine Ratio: 14 (ref 9–23)
BUN: 10 mg/dL (ref 6–20)
Bilirubin Total: 0.4 mg/dL (ref 0.0–1.2)
CO2: 21 mmol/L (ref 20–29)
Calcium: 9.9 mg/dL (ref 8.7–10.2)
Chloride: 102 mmol/L (ref 96–106)
Creatinine, Ser: 0.73 mg/dL (ref 0.57–1.00)
Globulin, Total: 2.7 g/dL (ref 1.5–4.5)
Glucose: 75 mg/dL (ref 70–99)
Potassium: 3.9 mmol/L (ref 3.5–5.2)
Sodium: 139 mmol/L (ref 134–144)
Total Protein: 7.3 g/dL (ref 6.0–8.5)
eGFR: 110 mL/min/1.73 (ref >59)

## 2024-07-17 LAB — HEMOGLOBIN A1C
Est. average glucose Bld gHb Est-mCnc: 120 mg/dL
Hgb A1c MFr Bld: 5.8 % — ABNORMAL HIGH (ref 4.8–5.6)

## 2024-07-17 LAB — INSULIN AND C-PEPTIDE, SERUM
C-Peptide: 5.5 ng/mL — ABNORMAL HIGH (ref 1.1–4.4)
INSULIN: 30.6 u[IU]/mL — ABNORMAL HIGH (ref 2.6–24.9)

## 2024-07-21 ENCOUNTER — Ambulatory Visit: Payer: Self-pay

## 2024-07-21 DIAGNOSIS — Z6841 Body Mass Index (BMI) 40.0 and over, adult: Secondary | ICD-10-CM | POA: Insufficient documentation

## 2024-07-21 DIAGNOSIS — F418 Other specified anxiety disorders: Secondary | ICD-10-CM | POA: Insufficient documentation

## 2024-07-21 DIAGNOSIS — G4711 Idiopathic hypersomnia with long sleep time: Secondary | ICD-10-CM | POA: Insufficient documentation

## 2024-07-21 NOTE — Assessment & Plan Note (Signed)
 Symptoms suggestive of sleep apnea. Sleep study indicated for further evaluation. - Ordered sleep study to evaluate for sleep apnea.

## 2024-07-21 NOTE — Assessment & Plan Note (Signed)
 Obesity with PCOS. Previous weight loss attempts unsuccessful. Metformin  considered for weight management and PCOS regulation. - Prescribed metformin , one tablet twice a day with meals.

## 2024-07-21 NOTE — Assessment & Plan Note (Signed)
 Ongoing anxiety and depression. Previously on sertraline, which was effective. Current symptoms include feeling sick and irritability. - Prescribed sertraline 50 mg, one tablet once a day. Start with half a tablet for the first week.

## 2024-07-21 NOTE — Assessment & Plan Note (Addendum)
 Elevated blood pressure with headaches. Previously on labetalol  during pregnancy. Current blood pressure remains high. - Prescribed valsartan 40 mg, one tablet once a day. Consider starting with half a tablet if blood pressure is very high.

## 2024-09-04 NOTE — Progress Notes (Deleted)
 "  @Patient  ID: Allison Garcia Mon, female    DOB: 05-22-1991, 34 y.o.   MRN: [id number]  No chief complaint on file.   Referring provider: Bevely Doffing, FNP  HPI: 34 year old female, former smoked. PMH significant for HTN, GERD, PCOS, gestational diabetes, preeclampsia.   09/05/2024 Discussed the use of AI scribe software for clinical note transcription with the patient, who gave verbal consent to proceed.  History of Present Illness   Presents for sleep consult  Check BP readings    Allergies[1]   There is no immunization history on file for this patient.  Past Medical History:  Diagnosis Date   Abnormal Pap smear of cervix    Back pain    GERD (gastroesophageal reflux disease) 03/22/2020   Gestational diabetes    History of stomach ulcers    told by ED. no EGD   Preeclampsia    Pregnancy induced hypertension     Tobacco History: Tobacco Use History[2] Counseling given: Not Answered   Outpatient Medications Prior to Visit  Medication Sig Dispense Refill   acetaminophen  (TYLENOL ) 500 MG tablet Take 1,000 mg by mouth every 6 (six) hours as needed (pain.).     albuterol  (VENTOLIN  HFA) 108 (90 Base) MCG/ACT inhaler Inhale 1-2 puffs into the lungs every 6 (six) hours as needed for wheezing or shortness of breath.     ibuprofen  (ADVIL ) 600 MG tablet Take 1 tablet (600 mg total) by mouth every 6 (six) hours as needed. 30 tablet 0   metFORMIN  (GLUCOPHAGE ) 500 MG tablet Take 1 tablet (500 mg total) by mouth 2 (two) times daily with a meal. 60 tablet 3   Omeprazole 20 MG TBEC Take 20 mg by mouth daily before breakfast.     ondansetron  (ZOFRAN -ODT) 4 MG disintegrating tablet Take 1 tablet (4 mg total) by mouth every 8 (eight) hours as needed. 20 tablet 0   sertraline  (ZOLOFT ) 50 MG tablet Take 1 tablet (50 mg total) by mouth daily. 30 tablet 3   valsartan  (DIOVAN ) 40 MG tablet Take 1 tablet (40 mg total) by mouth daily. 30 tablet 3   No facility-administered medications prior  to visit.      Review of Systems  Review of Systems   Physical Exam  LMP 03/26/2024  Physical Exam  ***  Lab Results:  CBC    Component Value Date/Time   WBC 9.8 07/16/2024 1616   WBC 8.7 04/04/2024 0924   RBC 5.35 (H) 07/16/2024 1616   RBC 5.00 04/04/2024 0924   HGB 15.7 07/16/2024 1616   HCT 47.5 (H) 07/16/2024 1616   PLT 262 07/16/2024 1616   MCV 89 07/16/2024 1616   MCH 29.3 07/16/2024 1616   MCH 29.4 04/04/2024 0924   MCHC 33.1 07/16/2024 1616   MCHC 33.6 04/04/2024 0924   RDW 13.9 07/16/2024 1616   LYMPHSABS 2.8 12/04/2022 1335   MONOABS 0.7 11/17/2019 1111   EOSABS 0.0 12/04/2022 1335   BASOSABS 0.0 12/04/2022 1335    BMET    Component Value Date/Time   NA 139 07/16/2024 1616   K 3.9 07/16/2024 1616   CL 102 07/16/2024 1616   CO2 21 07/16/2024 1616   GLUCOSE 75 07/16/2024 1616   GLUCOSE 100 (H) 04/04/2024 0924   BUN 10 07/16/2024 1616   CREATININE 0.73 07/16/2024 1616   CALCIUM 9.9 07/16/2024 1616   GFRNONAA >60 04/04/2024 0924   GFRAA >60 11/17/2019 1111    BNP No results found for: BNP  ProBNP No results  found for: PROBNP  Imaging: No results found.   Assessment & Plan:   No problem-specific Assessment & Plan notes found for this encounter.   There are no diagnoses linked to this encounter.  Assessment and Plan Assessment & Plan       I personally spent a total of *** minutes in the care of the patient today including {Time Based Coding:210964241}.   Almarie LELON Ferrari, NP 09/04/2024     [1]  Allergies Allergen Reactions   Other Anaphylaxis and Shortness Of Breath    Bleach    [2]  Social History Tobacco Use  Smoking Status Former   Current packs/day: 1.00   Average packs/day: 1 pack/day for 9.0 years (9.0 ttl pk-yrs)   Types: Cigarettes  Smokeless Tobacco Never   "

## 2024-09-05 ENCOUNTER — Ambulatory Visit: Admitting: Primary Care

## 2024-10-01 ENCOUNTER — Encounter: Payer: Self-pay | Admitting: Primary Care

## 2024-10-01 ENCOUNTER — Ambulatory Visit: Admitting: Primary Care

## 2024-10-01 VITALS — BP 115/75 | HR 86 | Temp 97.5°F | Ht 61.0 in | Wt 249.2 lb

## 2024-10-01 DIAGNOSIS — R0683 Snoring: Secondary | ICD-10-CM

## 2024-10-01 DIAGNOSIS — Z6841 Body Mass Index (BMI) 40.0 and over, adult: Secondary | ICD-10-CM | POA: Diagnosis not present

## 2024-10-01 DIAGNOSIS — H6982 Other specified disorders of Eustachian tube, left ear: Secondary | ICD-10-CM | POA: Diagnosis not present

## 2024-10-01 NOTE — Patient Instructions (Addendum)
" °  VISIT SUMMARY: During your visit, we discussed your sleep disturbances and persistent fatigue, which may be related to obstructive sleep apnea. We also addressed your obesity and right ear pain. A home sleep study was ordered to evaluate for sleep apnea, and we discussed potential treatment options depending on the results. Additionally, we talked about weight loss strategies and medication, as well as treatment for your ear pain.  YOUR PLAN: -SUSPECTED OBSTRUCTIVE SLEEP APNEA: Obstructive sleep apnea is a condition where the airway becomes blocked during sleep, causing breathing to stop and start repeatedly. We have ordered a home sleep study to confirm the diagnosis. If sleep apnea is confirmed, we will discuss CPAP therapy, which helps keep the airway open, or an oral appliance if CPAP is not tolerated. Weight loss medication may be considered if your sleep apnea is moderate to severe and your BMI is over 40.  -OBESITY: Obesity is a condition where excess body fat negatively affects your health. Your BMI is 47, which may contribute to sleep apnea and other health issues. We discussed the potential use of weight loss medication if your sleep apnea is moderate to severe and your BMI is over 40. We will consider this option based on the results of your sleep study.  -EUSTACHIAN TUBE DYSFUNCTION, RIGHT EAR: Eustachian tube dysfunction occurs when the tube connecting the middle ear to the back of the nose becomes blocked, causing ear pain and fluid buildup. We recommend using an antihistamine and nasal spray like Flonase or Nasacort for 5-7 days. If your symptoms worsen or do not improve, please follow up with your primary care doctor.  INSTRUCTIONS: Please complete the home sleep study as ordered. Follow up with us  to discuss the results and potential treatment options. Use the antihistamine and nasal spray for your ear as recommended, and contact your primary care doctor if your ear symptoms do not  improve or worsen.  Orders: HST  Follow-up Send message 2 weeks after completing sleep study for results, if moderate-severe recommend virtual visit to discuss weight loss and potential CPAP needed  "

## 2024-10-01 NOTE — Progress Notes (Signed)
 "  @Patient  ID: Allison Garcia Mon, female    DOB: 12-16-1990, 34 y.o.   MRN: [id number]  No chief complaint on file.   Referring provider: Bevely Doffing, FNP  HPI: 34 year old female, former smoker. PMH significant for HTN, GERD, gestational diabetes.   10/01/2024 Discussed the use of AI scribe software for clinical note transcription with the patient, who gave verbal consent to proceed.  History of Present Illness Allison Garcia is a 34 year old female with hypertension who presents with sleep disturbances and persistent fatigue. She was referred by her primary doctor for evaluation of sleep disturbances and potential sleep apnea.  She experiences significant sleep disturbances characterized by loud snoring, described as 'really bad,' and has been informed by others that she stops breathing at times during sleep. She has also experienced waking up gasping or choking a couple of times. These symptoms have been ongoing for a while.  She typically goes to bed around 8:30 PM due to her one-year-old child needing to sleep in the same bed. It takes her approximately 30 to 45 minutes to fall asleep, and she wakes up two to three times during the night, even when her children are asleep. She starts her day at 5 AM, getting out of bed by 6 AM, resulting in about four to five hours of sleep per night, which she acknowledges is insufficient.  Her family history is notable for her mother having sleep apnea. She has not undergone a sleep study previously.  Her current medication includes valsartan  (Diovan ) 40 mg once daily for hypertension.  Allergies[1]   There is no immunization history on file for this patient.  Past Medical History:  Diagnosis Date   Abnormal Pap smear of cervix    Back pain    GERD (gastroesophageal reflux disease) 03/22/2020   Gestational diabetes    History of stomach ulcers    told by ED. no EGD   Preeclampsia    Pregnancy induced hypertension     Tobacco  History: Tobacco Use History[2] Counseling given: Not Answered   Outpatient Medications Prior to Visit  Medication Sig Dispense Refill   acetaminophen  (TYLENOL ) 500 MG tablet Take 1,000 mg by mouth every 6 (six) hours as needed (pain.).     albuterol  (VENTOLIN  HFA) 108 (90 Base) MCG/ACT inhaler Inhale 1-2 puffs into the lungs every 6 (six) hours as needed for wheezing or shortness of breath.     ibuprofen  (ADVIL ) 600 MG tablet Take 1 tablet (600 mg total) by mouth every 6 (six) hours as needed. 30 tablet 0   metFORMIN  (GLUCOPHAGE ) 500 MG tablet Take 1 tablet (500 mg total) by mouth 2 (two) times daily with a meal. 60 tablet 3   Omeprazole 20 MG TBEC Take 20 mg by mouth daily before breakfast.     ondansetron  (ZOFRAN -ODT) 4 MG disintegrating tablet Take 1 tablet (4 mg total) by mouth every 8 (eight) hours as needed. 20 tablet 0   sertraline  (ZOLOFT ) 50 MG tablet Take 1 tablet (50 mg total) by mouth daily. 30 tablet 3   valsartan  (DIOVAN ) 40 MG tablet Take 1 tablet (40 mg total) by mouth daily. 30 tablet 3   No facility-administered medications prior to visit.      Review of Systems  Review of Systems  Constitutional:  Positive for fatigue and unexpected weight change.  Psychiatric/Behavioral:  Positive for sleep disturbance.    Physical Exam  LMP 03/26/2024  Physical Exam Constitutional:      Appearance:  Normal appearance. She is well-developed.  HENT:     Head: Normocephalic and atraumatic.     Left Ear: No drainage, swelling or tenderness. A middle ear effusion is present. There is no impacted cerumen. Tympanic membrane is not injected or erythematous.     Mouth/Throat:     Mouth: Mucous membranes are moist.     Pharynx: Oropharynx is clear.  Eyes:     Pupils: Pupils are equal, round, and reactive to light.  Cardiovascular:     Rate and Rhythm: Normal rate and regular rhythm.     Heart sounds: Normal heart sounds. No murmur heard. Pulmonary:     Effort: Pulmonary effort  is normal. No respiratory distress.     Breath sounds: Normal breath sounds. No wheezing or rhonchi.  Musculoskeletal:        General: Normal range of motion.     Cervical back: Normal range of motion and neck supple.  Skin:    General: Skin is warm and dry.     Findings: No erythema or rash.  Neurological:     General: No focal deficit present.     Mental Status: She is alert and oriented to person, place, and time. Mental status is at baseline.  Psychiatric:        Mood and Affect: Mood normal.        Behavior: Behavior normal.        Thought Content: Thought content normal.        Judgment: Judgment normal.     Lab Results:  CBC    Component Value Date/Time   WBC 9.8 07/16/2024 1616   WBC 8.7 04/04/2024 0924   RBC 5.35 (H) 07/16/2024 1616   RBC 5.00 04/04/2024 0924   HGB 15.7 07/16/2024 1616   HCT 47.5 (H) 07/16/2024 1616   PLT 262 07/16/2024 1616   MCV 89 07/16/2024 1616   MCH 29.3 07/16/2024 1616   MCH 29.4 04/04/2024 0924   MCHC 33.1 07/16/2024 1616   MCHC 33.6 04/04/2024 0924   RDW 13.9 07/16/2024 1616   LYMPHSABS 2.8 12/04/2022 1335   MONOABS 0.7 11/17/2019 1111   EOSABS 0.0 12/04/2022 1335   BASOSABS 0.0 12/04/2022 1335    BMET    Component Value Date/Time   NA 139 07/16/2024 1616   K 3.9 07/16/2024 1616   CL 102 07/16/2024 1616   CO2 21 07/16/2024 1616   GLUCOSE 75 07/16/2024 1616   GLUCOSE 100 (H) 04/04/2024 0924   BUN 10 07/16/2024 1616   CREATININE 0.73 07/16/2024 1616   CALCIUM 9.9 07/16/2024 1616   GFRNONAA >60 04/04/2024 0924   GFRAA >60 11/17/2019 1111    BNP No results found for: BNP  ProBNP No results found for: PROBNP  Imaging: No results found.   Assessment & Plan:   1. Loud snoring (Primary)  2. BMI 45.0-49.9, adult (HCC)  Assessment and Plan Assessment & Plan Suspected obstructive sleep apnea Moderate to high suspicion of obstructive sleep apnea due to symptoms of snoring, witnessed apneas, and daytime fatigue.  Family history of sleep apnea. Discussed pathophysiology, including obstructive events due to airway obstruction by the tongue and soft tissue. Explained severity classification based on apnea-hypopnea index (AHI) and treatment options depending on severity. Home sleep study preferred for initial screening. CPAP is the gold standard for moderate to severe cases, with oral appliances as an alternative for mild cases. Weight loss medication like Zepbound may be considered for moderate to severe cases with BMI over 40, but  insurance coverage may be limited. - Ordered home sleep study through [facility] - Will discuss potential CPAP therapy if sleep apnea is confirmed and symptomatic - Will consider oral appliance for mild cases if CPAP is not tolerated - Will discuss weight loss medication options if sleep apnea is moderate to severe and BMI is over 40  Obesity BMI of 47. Discussed potential role in sleep apnea and overall health. Previous attempts at weight loss through diet and exercise have been unsuccessful. Discussed potential use of weight loss medication like Zepbound if sleep apnea is moderate to severe and BMI is over 40, but insurance coverage may be limited. - Will consider weight loss medication if sleep apnea is moderate to severe and BMI is over 40  Eustachian tube dysfunction, right ear Right ear pain with fluid present in the ear canal. No signs of infection such as bulging tympanic membrane or pus. Ear canal slightly red. Likely related to fluid accumulation rather than infection. - Recommended antihistamine and nasal spray (e.g., Flonase or Nasacort) for 5-7 days - Advised follow-up with primary care if symptoms worsen or do not improve  Recording duration: 18 minutes   Almarie LELON Ferrari, NP 10/01/2024     [1]  Allergies Allergen Reactions   Other Anaphylaxis and Shortness Of Breath    Bleach    [2]  Social History Tobacco Use  Smoking Status Former   Current  packs/day: 1.00   Average packs/day: 1 pack/day for 9.0 years (9.0 ttl pk-yrs)   Types: Cigarettes  Smokeless Tobacco Never   "

## 2024-11-05 ENCOUNTER — Ambulatory Visit
